# Patient Record
Sex: Female | Born: 2000 | Hispanic: No | Marital: Single | Smoking: Never smoker
Health system: Southern US, Community
[De-identification: ages and names within clinical notes are randomized; demographics above are authoritative.]

## PROBLEM LIST (undated history)

## (undated) ENCOUNTER — Inpatient Hospital Stay (HOSPITAL_COMMUNITY): Payer: Self-pay

## (undated) DIAGNOSIS — K219 Gastro-esophageal reflux disease without esophagitis: Secondary | ICD-10-CM

## (undated) HISTORY — DX: Gastro-esophageal reflux disease without esophagitis: K21.9

## (undated) HISTORY — PX: NO PAST SURGERIES: SHX2092

---

## 2020-12-13 ENCOUNTER — Ambulatory Visit: Payer: Self-pay | Admitting: Nurse Practitioner

## 2020-12-16 ENCOUNTER — Other Ambulatory Visit: Payer: Self-pay

## 2020-12-16 ENCOUNTER — Emergency Department (HOSPITAL_COMMUNITY)
Admission: EM | Admit: 2020-12-16 | Discharge: 2020-12-17 | Disposition: A | Discharge status: Home / Self Care | Payer: Medicaid Other | Attending: Emergency Medicine | Admitting: Emergency Medicine

## 2020-12-16 DIAGNOSIS — R55 Syncope and collapse: Secondary | ICD-10-CM

## 2020-12-16 DIAGNOSIS — R519 Headache, unspecified: Secondary | ICD-10-CM | POA: Diagnosis not present

## 2020-12-16 DIAGNOSIS — R42 Dizziness and giddiness: Secondary | ICD-10-CM | POA: Insufficient documentation

## 2020-12-16 LAB — URINALYSIS, ROUTINE W REFLEX MICROSCOPIC
Bilirubin Urine: NEGATIVE
Glucose, UA: NEGATIVE mg/dL
Hgb urine dipstick: NEGATIVE
Ketones, ur: NEGATIVE mg/dL
Leukocytes,Ua: NEGATIVE
Nitrite: NEGATIVE
Protein, ur: NEGATIVE mg/dL
Specific Gravity, Urine: 1.009 (ref 1.005–1.030)
pH: 8 (ref 5.0–8.0)

## 2020-12-16 LAB — CBC
HCT: 40.1 % (ref 36.0–46.0)
Hemoglobin: 13.3 g/dL (ref 12.0–15.0)
MCH: 29.1 pg (ref 26.0–34.0)
MCHC: 33.2 g/dL (ref 30.0–36.0)
MCV: 87.7 fL (ref 80.0–100.0)
Platelets: 195 10*3/uL (ref 150–400)
RBC: 4.57 MIL/uL (ref 3.87–5.11)
RDW: 12.4 % (ref 11.5–15.5)
WBC: 9.9 10*3/uL (ref 4.0–10.5)
nRBC: 0 % (ref 0.0–0.2)

## 2020-12-16 LAB — BASIC METABOLIC PANEL
Anion gap: 10 (ref 5–15)
BUN: 12 mg/dL (ref 6–20)
CO2: 25 mmol/L (ref 22–32)
Calcium: 9.5 mg/dL (ref 8.9–10.3)
Chloride: 106 mmol/L (ref 98–111)
Creatinine, Ser: 0.62 mg/dL (ref 0.44–1.00)
GFR, Estimated: >60 mL/min (ref >60)
Glucose, Bld: 88 mg/dL (ref 70–99)
Potassium: 4.2 mmol/L (ref 3.5–5.1)
Sodium: 141 mmol/L (ref 135–145)

## 2020-12-16 LAB — I-STAT BETA HCG BLOOD, ED (MC, WL, AP ONLY): I-stat hCG, quantitative: <5 m[IU]/mL (ref <5)

## 2020-12-16 NOTE — ED Triage Notes (Signed)
Pt bib [facility] from home/air BNB. Pt apparently had witnessed syncopal episode. Previously seen/eval for same, no relevant hx. Concerned episode could be stress related   20LAC

## 2020-12-17 ENCOUNTER — Other Ambulatory Visit: Payer: Self-pay

## 2020-12-17 ENCOUNTER — Emergency Department (HOSPITAL_COMMUNITY): Payer: Medicaid Other

## 2020-12-17 ENCOUNTER — Encounter (HOSPITAL_COMMUNITY): Payer: Self-pay | Admitting: Student

## 2020-12-17 DIAGNOSIS — R55 Syncope and collapse: Secondary | ICD-10-CM

## 2020-12-17 LAB — CBG MONITORING, ED: Glucose-Capillary: 110 mg/dL — ABNORMAL HIGH (ref 70–99)

## 2020-12-17 MED ORDER — SODIUM CHLORIDE 0.9 % IV BOLUS
1000.0000 mL | Freq: Once | INTRAVENOUS | Status: AC
  Administered 2020-12-17: 1000 mL via INTRAVENOUS

## 2020-12-17 MED ORDER — IOHEXOL 350 MG/ML SOLN
75.0000 mL | Freq: Once | INTRAVENOUS | Status: AC | PRN
  Administered 2020-12-17: 75 mL via INTRAVENOUS

## 2020-12-17 MED ORDER — AMITRIPTYLINE HCL 25 MG PO TABS: 25.0000 mg | ORAL_TABLET | Freq: Every day | ORAL | 0 refills | Status: DC

## 2020-12-17 NOTE — ED Notes (Signed)
Received pt at this time from triage.  

## 2020-12-17 NOTE — ED Provider Notes (Signed)
[facility] EMERGENCY DEPARTMENT Provider Note   CSN: [id number] Arrival date & time: 12/16/20  2133     History Chief Complaint  Patient presents with  . Near Syncope  . Loss of Consciousness    Bethany Harris is a 20 y.o. female who presents to the emergency department with her father for evaluation of syncopal episode that occurred last night. Patient's father provides primary history and patient confirms- he relays that the patient has been having episodes where she gets a gradual onset headache with dizziness/lightheadedness and subsequently becomes unresponsive. She remains breathing, but does not verbally respond and this lasts approximately 2-3 hours at a time. They attempt to wake her and splash water on her with no response. They have not noted any shaking/jerking or seizure activity. She has had about 15 episodes in the past 10 months. Recently moved to the [place] from [place]. Had seen doctors there and saw an ED in [place]- has had reassuring CT of the head in the past. Her mother had similar sxs and per documentation they have brought with them it appears died from a ruptured aneurysm. Deny fever, vomiting, diarrhea, chest pain, dyspnea, or abdominal pain.   HPI     No past medical history on file.  There are no problems to display for this patient.   History reviewed. No pertinent surgical history.   OB History   No obstetric history on file.     No family history on file.     Home Medications Prior to Admission medications   Not on File    Allergies    Patient has no allergy information on record.  Review of Systems   Review of Systems  Constitutional: Negative for chills and fever.  Eyes: Negative for visual disturbance.  Respiratory: Negative for shortness of breath.   Cardiovascular: Negative for chest pain.  Gastrointestinal: Negative for diarrhea, nausea and vomiting.  Neurological: Positive for dizziness, syncope,  light-headedness and headaches. Negative for weakness and numbness.  All other systems reviewed and are negative.   Physical Exam Updated Vital Signs BP 98/64 (BP Location: Right Arm)   Pulse 86   Temp 98.2 F (36.8 C) (Oral)   Resp 18   Ht 5\' 2"  (1.575 m)   Wt 49.9 kg   SpO2 99%   BMI 20.12 kg/m   Physical Exam Vitals and nursing note reviewed.  Constitutional:      General: She is not in acute distress.    Appearance: She is well-developed. She is not toxic-appearing.     Comments: Initially sleeping, easily awakened and subsequently alert.   HENT:     Head: Normocephalic and atraumatic.  Eyes:     General:        Right eye: No discharge.        Left eye: No discharge.     Extraocular Movements: Extraocular movements intact.     Conjunctiva/sclera: Conjunctivae normal.     Pupils: Pupils are equal, round, and reactive to light.  Cardiovascular:     Rate and Rhythm: Normal rate and regular rhythm.     Heart sounds: No murmur heard.   Pulmonary:     Effort: Pulmonary effort is normal. No respiratory distress.     Breath sounds: Normal breath sounds. No wheezing, rhonchi or rales.  Abdominal:     General: There is no distension.     Palpations: Abdomen is soft.     Tenderness: There is no abdominal tenderness. There is no  guarding or rebound.  Musculoskeletal:     Cervical back: Neck supple. No rigidity.  Skin:    General: Skin is warm and dry.     Findings: No rash.  Neurological:     Mental Status: She is alert.     Comments: Clear speech.  CN III through XII grossly intact.  Sensation grossly intact bilateral upper and lower extremities.  5 out of 5 symmetric grip strength.  5 out of 5 strength with plantar dorsiflexion bilaterally.  Intact finger-to-nose.  Psychiatric:        Behavior: Behavior normal.     ED Results / Procedures / Treatments   Labs (all labs ordered are listed, but only abnormal results are displayed) Labs Reviewed  URINALYSIS, ROUTINE  W REFLEX MICROSCOPIC - Abnormal; Notable for the following components:      Result Value   Color, Urine STRAW (*)    All other components within normal limits  CBG MONITORING, ED - Abnormal; Notable for the following components:   Glucose-Capillary 110 (*)    All other components within normal limits  BASIC METABOLIC PANEL  CBC  I-STAT BETA HCG BLOOD, ED ([facility], [facility], [facility] ONLY)    EKG EKG Interpretation  Date/Time:  Thursday December 16 2020 21:46:34 EST Ventricular Rate:  87 PR Interval:  110 QRS Duration: 78 QT Interval:  348 QTC Calculation: 418 R Axis:   78 Text Interpretation: Sinus rhythm with short PR Otherwise normal ECG No old tracing to compare Confirmed by Rolan Bucco [phone]) on 12/17/2020 7:39:45 AM   Radiology No results found.  Procedures Procedures (including critical care time)  Medications Ordered in ED Medications  iohexol (OMNIPAQUE) 350 MG/ML injection 75 mL (has no administration in time range)  sodium chloride 0.9 % bolus 1,000 mL (0 mLs Intravenous Stopped 12/17/20 0630)    ED Course  I have reviewed the triage vital signs and the nursing notes.  Pertinent labs & imaging results that were available during my care of the patient were reviewed by me and considered in my medical decision making (see chart for details).    MDM Rules/Calculators/A&P                         Patient presents to the ED with complaints of recurrent episodes where she gets dizzy with a headache and subsequently passes out and is not verbally responsive for 2 to 3 hours.  This seems to be a recurrent problem over the past 1 year.  She is nontoxic, resting comfortably, vitals without significant abnormality.  No focal neurologic deficits.  Heart regular rate and rhythm.  Additional history obtained:  Additional history obtained from paperwork patient's family has brought with them.  EKG: Sinus rhythm, on cardiac monitor w/o arrhythmias currently.   Lab Tests:  I Ordered,  reviewed, and interpreted labs, which included:  CBC, BMP, pregnancy test, and CBG: Grossly unremarkable  Imaging Studies ordered:  I ordered imaging studies which included CTA head/neck given concern that her mother may have died from an aneurysm.   Overall unclear definitive etiology to these episodes patient is having.  She has a reassuring laboratory work-up and physical exam.  If her CT angio does not show any significant concerning abnormalities plan for discharge home with outpatient neurology follow-up.  Findings and plan of care discussed with attending Dr. Fredderick Phenix who is in agreement.  Patient care signed out to PA Aberman at change of shift pending CTAs and disposition.  Portions of this note were generated with Scientist, clinical (histocompatibility and immunogenetics). Dictation errors may occur despite best attempts at proofreading.  Final Clinical Impression(s) / ED Diagnoses Final diagnoses:  Syncope, unspecified syncope type    Rx / DC Orders ED Discharge Orders    None       Cherly Anderson, PA-C 12/17/20 0741    Rolan Bucco, MD 12/17/20 [id number]

## 2020-12-17 NOTE — Discharge Instructions (Addendum)
You were seen in the emergency department today for episodes of headaches, dizziness, and passing out.  Your work-up in the ER was overall reassuring.  We would like you to stay well-hydrated and follow-up closely with neurology.  We have placed a referral, they will call you to set up a follow-up appointment.  If you they do not call you within the next few days please call the office your discharge instructions.  Please return to the ER for new or worsening symptoms including but not limited to prolonged decreased responsiveness, seizure activity, new headache, severe headache, sudden change in headache, inability to keep fluids down, fever, numbness, weakness, visual disturbance, chest pain, trouble breathing, or any other concerns.  The neurologist spoke to you today and recommends taking Elavil 25mg  every night before bed. If you do not hear from neurology within the next week, please call to schedule an appointment.

## 2020-12-17 NOTE — ED Provider Notes (Signed)
Care assumed from [facility], PA-C at shift change pending CTA head/neck. See her note for full HPI.  In short, patient is a 20 year old female who presents to the ED due to numerous syncopal events over the past 10 months with the most recent being last night.  Syncopal episodes preceded by headache associated with dizziness/lightheadedness.  Father at bedside notes that patient becomes unresponsive however, still breathing, will not answer any questions for roughly 2 to 3 hours.  No convulsions or urinary incontinence.  Per father, patient has had 15 episodes over the past 10 months.  Patient recently moved to the [place] from [place] and has had previous work-ups both in [place] and in [place] with reassuring results. Per father, mother had similar symptoms and passed away from a ruptured aneurysm.   Plan from previous provider: if CTA head/neck is unremarkable, patient may be discharged with ambulatory referral to neurology.  Physical Exam  BP 98/64 (BP Location: Right Arm)   Pulse 86   Temp 98.2 F (36.8 C) (Oral)   Resp 18   Ht 5\' 2"  (1.575 m)   Wt 49.9 kg   SpO2 99%   BMI 20.12 kg/m   Physical Exam Constitutional:      General: She is not in acute distress. HENT:     Head: Normocephalic.  Eyes:     Pupils: Pupils are equal, round, and reactive to light.  Cardiovascular:     Rate and Rhythm: Normal rate and regular rhythm.     Pulses: Normal pulses.     Heart sounds: Normal heart sounds. No murmur heard. No friction rub. No gallop.   Pulmonary:     Effort: Pulmonary effort is normal.     Breath sounds: Normal breath sounds.  Abdominal:     General: Abdomen is flat. There is no distension.     Palpations: Abdomen is soft.     Tenderness: There is no abdominal tenderness. There is no guarding or rebound.  Musculoskeletal:        General: Normal range of motion.     Cervical back: Neck supple.  Skin:    General: Skin is warm and dry.  Neurological:      General: No focal deficit present.     Comments: Speech is clear, able to follow commands CN III-XII intact Normal strength in upper and lower extremities bilaterally including dorsiflexion and plantar flexion, strong and equal grip strength Sensation grossly intact throughout Moves extremities without ataxia, coordination intact No pronator drift Ambulates without difficulty  Psychiatric:        Mood and Affect: Mood normal.        Behavior: Behavior normal.     ED Course/Procedures    Results for orders placed or performed during the hospital encounter of 12/16/20 (from the past 24 hour(s))  Urinalysis, Routine w reflex microscopic Urine, Clean Catch     Status: Abnormal   Collection Time: 12/16/20  9:46 PM  Result Value Ref Range   Color, Urine STRAW (A) YELLOW   APPearance CLEAR CLEAR   Specific Gravity, Urine 1.009 1.005 - 1.030   pH 8.0 5.0 - 8.0   Glucose, UA NEGATIVE NEGATIVE mg/dL   Hgb urine dipstick NEGATIVE NEGATIVE   Bilirubin Urine NEGATIVE NEGATIVE   Ketones, ur NEGATIVE NEGATIVE mg/dL   Protein, ur NEGATIVE NEGATIVE mg/dL   Nitrite NEGATIVE NEGATIVE   Leukocytes,Ua NEGATIVE NEGATIVE  Basic metabolic panel     Status: None   Collection Time: 12/16/20 10:07  PM  Result Value Ref Range   Sodium 141 135 - 145 mmol/L   Potassium 4.2 3.5 - 5.1 mmol/L   Chloride 106 98 - 111 mmol/L   CO2 25 22 - 32 mmol/L   Glucose, Bld 88 70 - 99 mg/dL   BUN 12 6 - 20 mg/dL   Creatinine, Ser 2.37 0.44 - 1.00 mg/dL   Calcium 9.5 8.9 - 62.8 mg/dL   GFR, Estimated >31 >51 mL/min   Anion gap 10 5 - 15  CBC     Status: None   Collection Time: 12/16/20 10:07 PM  Result Value Ref Range   WBC 9.9 4.0 - 10.5 K/uL   RBC 4.57 3.87 - 5.11 MIL/uL   Hemoglobin 13.3 12.0 - 15.0 g/dL   HCT 76.1 60.7 - 37.1 %   MCV 87.7 80.0 - 100.0 fL   MCH 29.1 26.0 - 34.0 pg   MCHC 33.2 30.0 - 36.0 g/dL   RDW 06.2 69.4 - 85.4 %   Platelets 195 150 - 400 K/uL   nRBC 0.0 0.0 - 0.2 %  I-Stat beta hCG  blood, ED     Status: None   Collection Time: 12/16/20 10:43 PM  Result Value Ref Range   I-stat hCG, quantitative <5.0 <5 mIU/mL   Comment 3          CBG monitoring, ED     Status: Abnormal   Collection Time: 12/17/20  5:55 AM  Result Value Ref Range   Glucose-Capillary 110 (H) 70 - 99 mg/dL    Procedures  MDM  Care assumed from [facility], PA-C at shift change pending CTA head/neck. See her note for full MDM.  20 year old female presents to the ED due to numerous syncopal events over the past 10 months.  Syncopal events preceded by headache associated with dizziness/lightheadedness.  Mother had similar symptoms and passed away from a ruptured aneurysm. Patient present in the ED for over 10 hours during shift change with no syncopal episodes. Stable vitals. Soft BP. Patient given IVFs from previous provider. I have personally reviewed all labs and imaging from previous provider.  CBC unremarkable no leukocytosis and normal hemoglobin.  BMP unremarkable with normal renal function no major electrolyte derangements.  Pregnancy test negative.  UA reassuring with no signs of infection.  EKG personally reviewed which demonstrates normal sinus rhythm with short PR, but no signs of acute ischemia.  CTA head/neck personally reviewed which is negative for any acute abnormalities.   Discussed case with Dr. Amada Jupiter with neurology who recommends treating for possible migraines with Amitriptyline 25mg  and outpatient follow-up for EEG to rule out seizure even though suspicion is low. Suspect a possible psychogenic etiology.  Patient observed here in the ED for over 13 hours with no syncopal episodes. Patient given another L of IVFs with improvement in BP. Patient stable for discharge. Strict ED precautions discussed with patient. Patient states understanding and agrees to plan. Patient discharged home in no acute distress and stable vitals.   , PA-C 12/17/20 1058     12/19/20, MD 12/17/20 1136

## 2020-12-17 NOTE — ED Notes (Signed)
ED Provider at bedside. 

## 2020-12-17 NOTE — Consult Note (Signed)
Neurology Consultation  Reason for Consult: Concern of seizure  Referring Physician: ED physician  CC: Concern of seizure  History is obtained from: Patient and Father  HPI: Bethany Harris is a 20 y.o. female with no past medical history presented to ED for concern of seizure like episodes.    Patient father was at the bedside who provided the history.  He reported that patient had about 7-8 passing out episodes In last 4 months. Last episode last night. These episodes usually last for about 1 to 1-1/2-hour. Father denies any shaking and jerking movements during these episodes. Father reports that he attempts to call her name and splash water on her face but she doesn't respond. Patient had about 15 episodes in last 10 months.  Father states when they were in [place] her episodes lasted for 5 to 10 minutes but since coming to [place] these episodes are lasting for longer periods. Patient states she gets severe headache for about 15 minutes to half an hour, flashing of light for a few seconds, blackening in front of her eyes, and dizziness/lightheadness before passing out.  When she passes out she can only hear loud sounds on repeated calling but she is not able to move her extremities and when she wakes up she always have severe frontal headache. Father reports that she had seen doctors in ED in [place] and had normal CT of head in the past. Father reports that before one of these episode she vomited some blood before passing out. Patient denies any memory problems, fever, nausea, vomiting.  Father reports that her mother also had similar episodes and died at age 51 from ruptured aneurysm.   [place]. Pt has 6 other siblings and no other sibling have similar symptoms.   ROS: A 14 point ROS was performed and is negative except as noted in the HPI. Positive for Headache, passing out episodes.    History reviewed. No pertinent past medical history.    History reviewed. No pertinent family history. Mother had similar episodes. Died at the age of 2.  Social History:   has no history on file for tobacco use, alcohol use, and drug use.  Medications No current facility-administered medications for this encounter.  Current Outpatient Medications:  .  amitriptyline (ELAVIL) 25 MG tablet, Take 1 tablet (25 mg total) by mouth at bedtime., Disp: 30 tablet, Rfl: 0   Exam: Current vital signs: BP 115/72 (BP Location: Right Arm)   Pulse 89   Temp 98.6 F (37 C) (Oral)   Resp 20   Ht 5\' 2"  (1.575 m)   Wt 49.9 kg   SpO2 100%   BMI 20.12 kg/m  Vital signs in last 24 hours: Temp:  [98.2 F (36.8 C)-98.6 F (37 C)] 98.6 F (37 C) (01/21 1022) Pulse Rate:  [71-89] 89 (01/21 1022) Resp:  [14-20] 20 (01/21 1022) BP: (81-120)/(46-80) 115/72 (01/21 1022) SpO2:  [98 %-100 %] 100 % (01/21 1022) Weight:  [49.9 kg] 49.9 kg (01/21 0425)  GENERAL: Awake, alert in NAD, oriented x4, Knows date, month and year. HEENT: - Normocephalic and atraumatic, dry mucous membranes LUNGS - symmetrical chest rise, no labored breathing CV - No JVD, RRR, No peripheral Edema  ABDOMEN - Soft,  nondistended  Ext: warm, well perfused, noedema  NEURO:  Mental Status: AA&Ox4, Oriented to self, age, place, situation. Good Attention and Concentration Language: speech is fluent.  Pt is able to name simple objects, repetition intact,  fluency, and comprehension intact. Cranial Nerves: PERRL, EOMI, mild pain on Eye movements. Pain in Rt eye >Lt eye. Visual fields full, no facial asymmetry, facial sensation intact, hearing intact.  tongue/uvula/soft palate midline, normal sternocleidomastoid and trapezius muscle strength. No evidence of tongue atrophy or fibrillations Motor: No Drift in Upper Extremities, No Drift in LE's. Strength 5/5 in Rt UE, 5/5 in Lt UE, Strength in Rt LE  5/5, Lt LE 5/5 Reflexes- UE -2+  ,  LE-  2+ Tone: is normal and bulk is normal Sensation- Intact to light touch bilaterally. Symmetrical on Both sides. Coordination: FTN intact bilaterally, no ataxia in BLE. Gait- deferred    Labs I have reviewed labs in epic and the results pertinent to this consultation are:   CBC    Component Value Date/Time   WBC 9.9 12/16/2020 2207   RBC 4.57 12/16/2020 2207   HGB 13.3 12/16/2020 2207   HCT 40.1 12/16/2020 2207   PLT 195 12/16/2020 2207   MCV 87.7 12/16/2020 2207   MCH 29.1 12/16/2020 2207   MCHC 33.2 12/16/2020 2207   RDW 12.4 12/16/2020 2207    CMP     Component Value Date/Time   NA 141 12/16/2020 2207   K 4.2 12/16/2020 2207   CL 106 12/16/2020 2207   CO2 25 12/16/2020 2207   GLUCOSE 88 12/16/2020 2207   BUN 12 12/16/2020 2207   CREATININE 0.62 12/16/2020 2207   CALCIUM 9.5 12/16/2020 2207   GFRNONAA >60 12/16/2020 2207    Lipid Panel  No results found for: CHOL, TRIG, HDL, CHOLHDL, VLDL, LDLCALC, LDLDIRECT   Imaging I have reviewed the images obtained: CT head:  No evidence of acute intracranial abnormality.  CTA neck:  The common carotid, internal carotid and vertebral arteries are patent within the neck without stenosis.  CTA head:  Unremarkable exam. No intracranial large vessel occlusion or proximal high-grade arterial stenosis.   Assessment: Patient is a 20 year old female who presented to the ED with concerns of multiple seizure/syncopal episodes.  No jerking/shaking reported by father during these episodes.  Blood pressure running low with systolic between 00-938 and diastolic between 46 to 80 mmHg.  Labs WNL. CT head negative for acute intracranial abnormality.  CTA neck and head normal without stenosis. Pseudoseizures vs. Seizures. I suspect that her symptoms are less likely due to organic cause but we'll refer Pt for outpatient EEG. Also, concerns of migraine.  We'll start her on Amitriptyline 25 mg QHS for Migraine and see if it  helps with these episodes also. Could consider MRI on outpatient basis if she still gets these episodes.    Recommendations: - Continue Amitriptyline 25 mg QHS - Refer for Outpatient EEG. - Can consider MRI Brain if she gets these episodes again.  - Seizure precautions.   Dr. Karsten Ro MD PGY1 [facility] Neurology

## 2020-12-29 ENCOUNTER — Ambulatory Visit: Payer: Self-pay | Admitting: Nurse Practitioner

## 2021-01-03 ENCOUNTER — Other Ambulatory Visit: Payer: Self-pay | Admitting: Nurse Practitioner

## 2021-01-03 ENCOUNTER — Ambulatory Visit (INDEPENDENT_AMBULATORY_CARE_PROVIDER_SITE_OTHER): Payer: Medicaid Other | Admitting: Nurse Practitioner

## 2021-01-03 ENCOUNTER — Other Ambulatory Visit: Payer: Self-pay

## 2021-01-03 ENCOUNTER — Encounter: Payer: Self-pay | Admitting: Nurse Practitioner

## 2021-01-03 VITALS — BP 117/69 | HR 108 | Temp 98.8°F | Ht 63.0 in | Wt 134.0 lb

## 2021-01-03 DIAGNOSIS — G479 Sleep disorder, unspecified: Secondary | ICD-10-CM | POA: Diagnosis not present

## 2021-01-03 DIAGNOSIS — R55 Syncope and collapse: Secondary | ICD-10-CM | POA: Diagnosis not present

## 2021-01-03 DIAGNOSIS — R000 Tachycardia, unspecified: Secondary | ICD-10-CM

## 2021-01-03 DIAGNOSIS — Z7689 Persons encountering health services in other specified circumstances: Secondary | ICD-10-CM | POA: Diagnosis not present

## 2021-01-03 DIAGNOSIS — Z130 Encounter for screening for diseases of the blood and blood-forming organs and certain disorders involving the immune mechanism: Secondary | ICD-10-CM

## 2021-01-03 MED ORDER — AMITRIPTYLINE HCL 25 MG PO TABS: 50.0000 mg | ORAL_TABLET | Freq: Every day | ORAL | 2 refills | Status: DC

## 2021-01-03 NOTE — Patient Instructions (Signed)
Health Maintenance, Female Adopting a healthy lifestyle and getting preventive care are important in promoting health and wellness. Ask your health care provider about:  The right schedule for you to have regular tests and exams.  Things you can do on your own to prevent diseases and keep yourself healthy. What should I know about diet, weight, and exercise? Eat a healthy diet  Eat a diet that includes plenty of vegetables, fruits, low-fat dairy products, and lean protein.  Do not eat a lot of foods that are high in solid fats, added sugars, or sodium.   Maintain a healthy weight Body mass index (BMI) is used to identify weight problems. It estimates body fat based on height and weight. Your health care provider can help determine your BMI and help you achieve or maintain a healthy weight. Get regular exercise Get regular exercise. This is one of the most important things you can do for your health. Most adults should:  Exercise for at least 150 minutes each week. The exercise should increase your heart rate and make you sweat (moderate-intensity exercise).  Do strengthening exercises at least twice a week. This is in addition to the moderate-intensity exercise.  Spend less time sitting. Even light physical activity can be beneficial. Watch cholesterol and blood lipids Have your blood tested for lipids and cholesterol at 20 years of age, then have this test every 5 years. Have your cholesterol levels checked more often if:  Your lipid or cholesterol levels are high.  You are older than 20 years of age.  You are at high risk for heart disease. What should I know about cancer screening? Depending on your health history and family history, you may need to have cancer screening at various ages. This may include screening for:  Breast cancer.  Cervical cancer.  Colorectal cancer.  Skin cancer.  Lung cancer. What should I know about heart disease, diabetes, and high blood  pressure? Blood pressure and heart disease  High blood pressure causes heart disease and increases the risk of stroke. This is more likely to develop in people who have high blood pressure readings, are of African descent, or are overweight.  Have your blood pressure checked: ? Every 3-5 years if you are 18-39 years of age. ? Every year if you are 40 years old or older. Diabetes Have regular diabetes screenings. This checks your fasting blood sugar level. Have the screening done:  Once every three years after age 40 if you are at a normal weight and have a low risk for diabetes.  More often and at a younger age if you are overweight or have a high risk for diabetes. What should I know about preventing infection? Hepatitis B If you have a higher risk for hepatitis B, you should be screened for this virus. Talk with your health care provider to find out if you are at risk for hepatitis B infection. Hepatitis C Testing is recommended for:  Everyone born from 1945 through 1965.  Anyone with known risk factors for hepatitis C. Sexually transmitted infections (STIs)  Get screened for STIs, including gonorrhea and chlamydia, if: ? You are sexually active and are younger than 20 years of age. ? You are older than 20 years of age and your health care provider tells you that you are at risk for this type of infection. ? Your sexual activity has changed since you were last screened, and you are at increased risk for chlamydia or gonorrhea. Ask your health care provider   if you are at risk.  Ask your health care provider about whether you are at high risk for HIV. Your health care provider may recommend a prescription medicine to help prevent HIV infection. If you choose to take medicine to prevent HIV, you should first get tested for HIV. You should then be tested every 3 months for as long as you are taking the medicine. Pregnancy  If you are about to stop having your period (premenopausal) and  you may become pregnant, seek counseling before you get pregnant.  Take 400 to 800 micrograms (mcg) of folic acid every day if you become pregnant.  Ask for birth control (contraception) if you want to prevent pregnancy. Osteoporosis and menopause Osteoporosis is a disease in which the bones lose minerals and strength with aging. This can result in bone fractures. If you are 65 years old or older, or if you are at risk for osteoporosis and fractures, ask your health care provider if you should:  Be screened for bone loss.  Take a calcium or vitamin D supplement to lower your risk of fractures.  Be given hormone replacement therapy (HRT) to treat symptoms of menopause. Follow these instructions at home: Lifestyle  Do not use any products that contain nicotine or tobacco, such as cigarettes, e-cigarettes, and chewing tobacco. If you need help quitting, ask your health care provider.  Do not use street drugs.  Do not share needles.  Ask your health care provider for help if you need support or information about quitting drugs. Alcohol use  Do not drink alcohol if: ? Your health care provider tells you not to drink. ? You are pregnant, may be pregnant, or are planning to become pregnant.  If you drink alcohol: ? Limit how much you use to 0-1 drink a day. ? Limit intake if you are breastfeeding.  Be aware of how much alcohol is in your drink. In the [place] one drink equals one 12 oz bottle of beer (355 mL), one 5 oz glass of wine (148 mL), or one 1 oz glass of hard liquor (44 mL). General instructions  Schedule regular health, dental, and eye exams.  Stay current with your vaccines.  Tell your health care provider if: ? You often feel depressed. ? You have ever been abused or do not feel safe at home. Summary  Adopting a healthy lifestyle and getting preventive care are important in promoting health and wellness.  Follow your health care provider's instructions about healthy  diet, exercising, and getting tested or screened for diseases.  Follow your health care provider's instructions on monitoring your cholesterol and blood pressure. This information is not intended to replace advice given to you by your health care provider. Make sure you discuss any questions you have with your health care provider. Document Revised: 11/06/2018 Document Reviewed: 11/06/2018 Elsevier Patient Education  2021 Elsevier Inc.  

## 2021-01-03 NOTE — Progress Notes (Signed)
[facility] Patient [facility] [address] [place], [place]  [address] Phone:  [phone]   Fax:  [phone]   New Patient Office Visit  Subjective:  Patient ID: Bethany Harris, female    DOB: 11/27/2000  Age: 20 y.o. MRN: [id number]  CC:  Chief Complaint  Patient presents with  . New Patient (Initial Visit)    HPI Margarite Vessel presents to establish care. He  has a past medical history of GERD (gastroesophageal reflux disease).   Establish care not concerns. She is in today with her father who speaks [place] and is her interpreter. They have been in [place] for 4 months. She is currently taking Amitriptyline 25 mg for sleep was effective for a few days. She has not slept the last 2 days. She feels like she is having increased anxiety.  She admits that outside noise does affect her sleep.   Syncope Patient complains of syncope. Onset was a few months ago . Symptoms have stabilized since that time. Patient describes the episode as a sudden loss of consciousness without warning. Associated symptoms: none. The patient denies abdominal pain, diarrhea, excessive thirst, general feeling of lightheadedness, headache, heavy menstrual bleeding, history of CAD, melena, nausea and visual aura. Medications putting patient at risk for syncope: none. She had a MRI in [place]. She has had a syncopal episode. She was seen in the ER. She is to follow up with neurology. However the apt is unknown; not visible in her chart  Past Medical History:  Diagnosis Date  . GERD (gastroesophageal reflux disease)     History reviewed. No pertinent surgical history.  History reviewed. No pertinent family history.  Social History   Socioeconomic History  . Marital status: Married    Spouse name: Not on file  . Number of children: Not on file  . Years of education: Not on file  . Highest education level: Not on file  Occupational History  . Not on file  Tobacco Use  . Smoking status: Never Smoker   . Smokeless tobacco: Never Used  Substance and Sexual Activity  . Alcohol use: Never  . Drug use: Never  . Sexual activity: Not on file  Other Topics Concern  . Not on file  Social History Narrative  . Not on file   Social Determinants of Health   Financial Resource Strain: Not on file  Food Insecurity: Not on file  Transportation Needs: Not on file  Physical Activity: Not on file  Stress: Not on file  Social Connections: Not on file  Intimate Partner Violence: Not on file    ROS Review of Systems  Psychiatric/Behavioral:       Anxiety     Objective:   Today's Vitals: BP 117/69   Pulse (!) 108   Temp 98.8 F (37.1 C) (Temporal)   Ht 5\' 3"  (1.6 m)   Wt 134 lb (60.8 kg)   SpO2 99%   BMI 23.74 kg/m   Physical Exam Constitutional:      General: She is not in acute distress.    Appearance: She is normal weight.  HENT:     Head: Normocephalic and atraumatic.     Nose: Nose normal.     Mouth/Throat:     Mouth: Mucous membranes are moist.  Cardiovascular:     Rate and Rhythm: Normal rate and regular rhythm.     Pulses: Normal pulses.     Heart sounds: Normal heart sounds.  Pulmonary:  Effort: Pulmonary effort is normal.     Breath sounds: Normal breath sounds.  Abdominal:     General: Abdomen is flat. Bowel sounds are normal.  Musculoskeletal:        General: Normal range of motion.     Cervical back: Normal range of motion.     Comments: Hands appear swollen; denied  Skin:    General: Skin is warm and dry.     Capillary Refill: Capillary refill takes less than 2 seconds.  Neurological:     General: No focal deficit present.     Mental Status: She is alert and oriented to person, place, and time.  Psychiatric:        Mood and Affect: Mood normal.        Behavior: Behavior normal.        Thought Content: Thought content normal.        Judgment: Judgment normal.    Assessment  Primary Diagnosis & Pertinent Problem List: The primary encounter  diagnosis was Encounter to establish care. Diagnoses of Syncope, unspecified syncope type and Sleep difficulties were also pertinent to this visit.  Visit Diagnosis: 1. Encounter to establish care  Discussed female health maintenance;  Discussed regular hydration with water Discussed healthy diet and exercise  Discussed mental health Encouraged to call our office for an appointment with in ongoing concerns for questions.    2. Syncope, unspecified syncope type  Stable to establish care with neurology  3. Sleep difficulties  Persistent increased Amitriptyline 25 mg mg instructed to 1.5 tabs if not effective then may increase to amitriptyline 50 mg (2 tabs)    Outpatient Encounter Medications as of 01/03/2021  Medication Sig  . amitriptyline (ELAVIL) 25 MG tablet Take 25 mg by mouth at bedtime.   No facility-administered encounter medications on file as of 01/03/2021.    Follow-up: 3 month follow-ups o  Barbette Merino, NP

## 2021-01-04 LAB — IRON,TIBC AND FERRITIN PANEL
Ferritin: 53 ng/mL (ref 15–77)
Iron Saturation: 17 % (ref 15–55)
Iron: 55 ug/dL (ref 27–159)
Total Iron Binding Capacity: 328 ug/dL (ref 250–450)
UIBC: 273 ug/dL (ref 131–425)

## 2021-01-04 LAB — TSH: TSH: 1.14 u[IU]/mL (ref 0.450–4.500)

## 2021-01-14 ENCOUNTER — Ambulatory Visit: Payer: Medicaid Other | Attending: Internal Medicine | Admitting: Internal Medicine

## 2021-01-14 ENCOUNTER — Encounter: Payer: Self-pay | Admitting: Internal Medicine

## 2021-01-14 ENCOUNTER — Other Ambulatory Visit: Payer: Self-pay

## 2021-01-14 VITALS — BP 119/78 | HR 107 | Resp 16 | Ht 65.0 in | Wt 136.0 lb

## 2021-01-14 DIAGNOSIS — F320 Major depressive disorder, single episode, mild: Secondary | ICD-10-CM | POA: Diagnosis not present

## 2021-01-14 DIAGNOSIS — R55 Syncope and collapse: Secondary | ICD-10-CM | POA: Diagnosis not present

## 2021-01-14 DIAGNOSIS — Z2821 Immunization not carried out because of patient refusal: Secondary | ICD-10-CM | POA: Diagnosis not present

## 2021-01-14 NOTE — Progress Notes (Signed)
Patient ID: Bethany Harris, female    DOB: [birth year]  MRN: [id number]  CC: Hospitalization Follow-up (ED)   Subjective: Bethany Harris is a 20 y.o. female who presents for new pt visit.  Father is with her and interprets and provides most of the history.  Patient is from [place] and speaks  Her concerns today include:   Patient seen in the emergency room 12/17/2020 for syncopal episodes.  History from the ER is as follows: patient is a 20 year old female who presents to the ED due to numerous syncopal events over the past 10 months with the most recent being last night.  Syncopal episodes preceded by headache associated with dizziness/lightheadedness.  Father at bedside notes that patient becomes unresponsive however, still breathing, will not answer any questions for roughly 2 to 3 hours.  No convulsions or urinary incontinence.  Per father, patient has had 15 episodes over the past 10 months.  Patient recently moved to the [place] from [place] and has had previous work-ups both in [place] and in [place] with reassuring results. Per father, mother had similar symptoms and passed away from a ruptured aneurysm.   Patient's vitals were stable except for slightly low systolic blood pressure.  Patient was given IV fluids.  In the emergency department.  CTA of the head and neck was negative.  CBC, electrolytes and UA were normal.  Urine pregnancy test negative. Case was discussed with neurologist Dr. Amada Jupiter who recommended treating for possible migraine with amitriptyline 25 mg and outpatient follow-up for EEG to rule out seizure. Patient was seen 01/03/2021 at the [facility] by NP Brooke Dare to establish care and for follow-up from the emergency room.  Amitriptyline dose was increased to 50 mg.  Today Father reports that patient has been having these intermittent syncope for about a year but worse over the past 3 to 4 months.  She has had 7-8 episodes over the past 3 to 4 months.   First feels dizzy,  blurred vision and HA then she passes out.  No  Shaking, no loss of bowel or bladder function during these episodes.  Episodes can last 2-3 hrs Can hear people calling her name around her but states that she cannot understand what they are saying. Episodes started while she was still living in [place].  Her mother died in 02/02/2023 of last year from a brain aneurysm.  When they relocated to the [place], [place]  [place] and nothing substantial found.  He does have some copies of documents with him.  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  1 time they sent her to the hospital in  to the emergency room there where she had CAT scan of the head done 11/11/2020.  The CAT scan was normal..   Patient completed high school in her country.  She reports feeling depressed and anxious all the time. "I',m thinking a lot and I don't know what's going on with me." Not eating well. Feels like crying a lot.  No thoughts of hurting herself but states that sometimes she feels like banging her head repeatedly against the wall.  She has found Elavil helpful.  It helps her to relax and she sleeps well at night since being on it.  Headaches have also decreased. Besides losing her mother last year, father denies that she has had any other traumatic life-changing events Patient Active Problem  List   Diagnosis Date Noted  . Influenza vaccine refused 01/14/2021  . Tetanus, diphtheria, and acellular pertussis (Tdap) vaccination declined 01/14/2021  . Syncope      Current Outpatient Medications on File Prior to Visit  Medication Sig Dispense Refill  . amitriptyline (ELAVIL) 25 MG tablet Take 2 tablets (50 mg total) by mouth at bedtime. 60 tablet 2   No current facility-administered medications on file prior to visit.    No Known Allergies  Social History   Socioeconomic History  . Marital  status: Unknown    Spouse name: Not on file  . Number of children: Not on file  . Years of education: Not on file  . Highest education level: Not on file  Occupational History  . Not on file  Tobacco Use  . Smoking status: Never Smoker  . Smokeless tobacco: Never Used  Vaping Use  . Vaping Use: Never used  Substance and Sexual Activity  . Alcohol use: Never  . Drug use: Not on file  . Sexual activity: Not on file  Other Topics Concern  . Not on file  Social History Narrative  . Not on file   Social Determinants of Health   Financial Resource Strain: Not on file  Food Insecurity: Not on file  Transportation Needs: Not on file  Physical Activity: Not on file  Stress: Not on file  Social Connections: Not on file  Intimate Partner Violence: Not on file    No family history on file.  No past surgical history on file.  ROS: Review of Systems Negative except as stated above  PHYSICAL EXAM: BP 119/78   Pulse (!) 107   Resp 16   Ht 5\' 5"  (1.651 m)   Wt 136 lb (61.7 kg)   SpO2 98%   BMI 22.63 kg/m   Physical Exam  General appearance - alert, well appearing, young female and in no distress Mental status -patient with flat affect.  She appears timid.   Chest - clear to auscultation, no wheezes, rales or rhonchi, symmetric air entry Heart - normal rate, regular rhythm, normal S1, S2, no murmurs, rubs, clicks or gallops Neurological - cranial nerves II through XII intact, motor and sensory grossly normal bilaterally Extremities - peripheral pulses normal, no pedal edema, no clubbing or cyanosis  Depression screen PHQ 2/9 01/14/2021  Decreased Interest 2  Down, Depressed, Hopeless 1  PHQ - 2 Score 3  Altered sleeping 0  Tired, decreased energy 2  Change in appetite 3  Feeling bad or failure about yourself  1  Trouble concentrating 1  Moving slowly or fidgety/restless 0  Suicidal thoughts 0  PHQ-9 Score 10   GAD 7 : Generalized Anxiety Score 01/14/2021   Control/stop worrying 2  Worry too much - different things 3  Trouble relaxing 2  Restless 3  Afraid - awful might happen 3      CMP Latest Ref Rng & Units 12/16/2020  Glucose 70 - 99 mg/dL 88  BUN 6 - 20 mg/dL 12  Creatinine 12/18/2020 - 3.90 mg/dL 3.00  Sodium 9.23 - 300 mmol/L 141  Potassium 3.5 - 5.1 mmol/L 4.2  Chloride 98 - 111 mmol/L 106  CO2 22 - 32 mmol/L 25  Calcium 8.9 - 10.3 mg/dL 9.5   Lipid Panel  No results found for: CHOL, TRIG, HDL, CHOLHDL, VLDL, LDLCALC, LDLDIRECT  CBC    Component Value Date/Time   WBC 9.9 12/16/2020 2207   RBC 4.57 12/16/2020 2207   HGB  13.3 12/16/2020 2207   HCT 40.1 12/16/2020 2207   PLT 195 12/16/2020 2207   MCV 87.7 12/16/2020 2207   MCH 29.1 12/16/2020 2207   MCHC 33.2 12/16/2020 2207   RDW 12.4 12/16/2020 2207    ASSESSMENT AND PLAN:  1. Syncope and collapse I will refer her to neurology and cardiology to rule out any organic causes.  I doubt seizures would last 2 to 3 hours.  There is a strong possibility that there is a psychiatric component to this so I will also refer her to behavioral health.  Father is agreeable [place] the patient.  She will continue the amitriptyline for now. - Ambulatory referral to Neurology - Ambulatory referral to Cardiology  2. Major depressive disorder, single episode, mild with anxious distress (HCC) See #1 above - Ambulatory referral to Psychiatry  3. Influenza vaccine refused   4. Tetanus, diphtheria, and acellular pertussis (Tdap) vaccination declined    Patient was given the opportunity to ask questions.  Patient verbalized understanding of the plan and was able to repeat key elements of the plan.  No orders of the defined types were placed in this encounter.    Requested Prescriptions    No prescriptions requested or ordered in this encounter    No follow-ups on file.  Jonah Blue, MD, FACP

## 2021-01-14 NOTE — Patient Instructions (Signed)
I have referred you to the neurologist, the cardiologist and therapist.  Continue the amitriptyline.

## 2021-01-17 ENCOUNTER — Encounter: Payer: Self-pay | Admitting: Nurse Practitioner

## 2021-01-18 ENCOUNTER — Encounter: Payer: Self-pay | Admitting: Neurology

## 2021-01-18 ENCOUNTER — Encounter: Payer: Self-pay | Admitting: Internal Medicine

## 2021-01-18 ENCOUNTER — Ambulatory Visit: Payer: Medicaid Other | Admitting: Neurology

## 2021-01-18 ENCOUNTER — Ambulatory Visit (INDEPENDENT_AMBULATORY_CARE_PROVIDER_SITE_OTHER): Payer: Medicaid Other | Admitting: Internal Medicine

## 2021-01-18 ENCOUNTER — Other Ambulatory Visit: Payer: Self-pay

## 2021-01-18 VITALS — BP 122/70 | HR 92 | Ht 63.0 in | Wt 136.0 lb

## 2021-01-18 VITALS — BP 106/74 | HR 109 | Ht 63.0 in | Wt 135.8 lb

## 2021-01-18 DIAGNOSIS — R55 Syncope and collapse: Secondary | ICD-10-CM

## 2021-01-18 DIAGNOSIS — R6889 Other general symptoms and signs: Secondary | ICD-10-CM

## 2021-01-18 DIAGNOSIS — G44209 Tension-type headache, unspecified, not intractable: Secondary | ICD-10-CM

## 2021-01-18 NOTE — Progress Notes (Signed)
Patient is able to speak and understand some [place].  Dr. Pearlean Brownie at bedside, patient also speaks Hindu and Dr. Pearlean Brownie believes an interpreter is not needed.

## 2021-01-18 NOTE — Patient Instructions (Signed)
I had a long discussion with the patient and her father regards during her recurrent episodes of brief loss of consciousness and unresponsiveness and discussed differential diagnosis and answered questions.  I recommend further evaluation with checking EEG for epileptiform activity and MRI scan of the brain and MRA of the brain and neck to look for any neurovascular causes.  She also complained of mild headaches which seem like tension headaches since I recommend she increase participation in regular activities for stress relaxation like meditation, yoga and exercises.  She will return for follow-up in the future in 2 months but prefers seeing lady Dr. due okay thank you to cultural issues and hence I recommend follow-up with Dr. Huston Foley.  Tension Headache, Adult A tension headache is a feeling of pain, pressure, or aching over the front and sides of the head. The pain can be dull, or it can feel tight. There are two types of tension headache:  Episodic tension headache. This is when the headaches happen fewer than 15 days a month.  Chronic tension headache. This is when the headaches happen more than 15 days a month during a 35-month period. A tension headache can last from 30 minutes to several days. It is the most common kind of headache. Tension headaches are not normally associated with nausea or vomiting, and they do not get worse with physical activity. What are the causes? The exact cause of this condition is not known. Tension headaches are often triggered by stress, anxiety, or depression. Other triggers may include:  Alcohol.  Too much caffeine or caffeine withdrawal.  Respiratory infections, such as colds, flu, or sinus infections.  Dental problems or teeth clenching.  Fatigue.  Holding your head and neck in the same position for a long period of time, such as while using a computer.  Smoking.  Arthritis of the neck. What are the signs or symptoms? Symptoms of this  condition include:  A feeling of pressure or tightness around the head.  Dull, aching head pain.  Pain over the front and sides of the head.  Tenderness in the muscles of the head, neck, and shoulders. How is this diagnosed? This condition may be diagnosed based on your symptoms, your medical history, and a physical exam. If your symptoms are severe or unusual, you may have imaging tests, such as a CT scan or an MRI of your head. Your vision may also be checked. How is this treated? This condition may be treated with lifestyle changes and with medicines that help relieve symptoms. Follow these instructions at home: Managing pain  Take over-the-counter and prescription medicines only as told by your health care provider.  When you have a headache, lie down in a dark, quiet room.  If directed, put ice on your head and neck. To do this: ? Put ice in a plastic bag. ? Place a towel between your skin and the bag. ? Leave the ice on for 20 minutes, 2-3 times a day. ? Remove the ice if your skin turns bright red. This is very important. If you cannot feel pain, heat, or cold, you have a greater risk of damage to the area.  If directed, apply heat to the back of your neck as often as told by your health care provider. Use the heat source that your health care provider recommends, such as a moist heat pack or a heating pad. ? Place a towel between your skin and the heat source. ? Leave the heat on for  20-30 minutes. ? Remove the heat if your skin turns bright red. This is especially important if you are unable to feel pain, heat, or cold. You have a greater risk of getting burned. Eating and drinking  Eat meals on a regular schedule.  If you drink alcohol: ? Limit how much you have to:  0-1 drink a day for women who are not pregnant.  0-2 drinks a day for men. ? Know how much alcohol is in your drink. In the [place] one drink equals one 12 oz bottle of beer (355 mL), one 5 oz glass of  wine (148 mL), or one 1 oz glass of hard liquor (44 mL).  Drink enough fluid to keep your urine pale yellow.  Decrease your caffeine intake, or stop using caffeine. Lifestyle  Get 7-9 hours of sleep each night, or get the amount of sleep recommended by your health care provider.  At bedtime, remove computers, phones, and tablets from your room.  Find ways to manage your stress. This may include: ? Exercise. ? Deep breathing exercises. ? Yoga. ? Listening to music. ? Positive mental imagery.  Try to sit up straight and avoid tensing your muscles.  Do not use any products that contain nicotine or tobacco. These include cigarettes, chewing tobacco, and vaping devices, such as e-cigarettes. If you need help quitting, ask your health care provider. General instructions  Avoid any headache triggers. Keep a journal to help find out what may trigger your headaches. For example, write down: ? What you eat and drink. ? How much sleep you get. ? Any change to your diet or medicines.  Keep all follow-up visits. This is important.   Contact a health care provider if:  Your headache does not get better.  Your headache comes back.  You are sensitive to sounds, light, or smells because of a headache.  You have nausea or you vomit.  Your stomach hurts. Get help right away if:  You suddenly develop a severe headache, along with any of the following: ? A stiff neck. ? Nausea and vomiting. ? Confusion. ? Weakness in one part or one side of your body. ? Double vision or loss of vision. ? Shortness of breath. ? Rash. ? Unusual sleepiness. ? Fever or chills. ? Trouble speaking. ? Pain in your eye or ear. ? Trouble walking or balancing. ? Feeling faint or passing out. Summary  A tension headache is a feeling of pain, pressure, or aching over the front and sides of the head.  A tension headache can last from 30 minutes to several days. It is the most common kind of  headache.  This condition may be diagnosed based on your symptoms, your medical history, and a physical exam.  This condition may be treated with lifestyle changes and with medicines that help relieve symptoms. This information is not intended to replace advice given to you by your health care provider. Make sure you discuss any questions you have with your health care provider. Document Revised: 08/12/2020 Document Reviewed: 08/12/2020 Elsevier Patient Education  2021 [employer].

## 2021-01-18 NOTE — Progress Notes (Signed)
Cardiology Office Note:    Date:  01/18/2021   ID:  Bethany Harris, DOB November 21, 2001, MRN [id number]  PCP:  Patient, No Pcp Per   [facility]  Cardiologist:  No primary care provider on file.  Advanced Practice Provider:  No care team member to display Electrophysiologist:  None       CC: Near syncope Consulted for the evaluation of syncope at the behest of Dr. Laural Benes  History of Present Illness:    Bethany Harris is a 20 y.o. female with a hx of near syncope and congenital heart disease in the family who presents for evaluation 01/18/21.  Patient notes that she is feeling good.  Has had a history of almost passing out over the past year. Has had no chest pain, chest pressure, chest tightness, chest stinging.  Patient had near syncope that was associated with a headache prior, after this she felt dizzy and had to lay down.  Appears to have an aura prior. After this would sleep for several hours. Twice has passed out from standing (back in [place]) but never with exercise or activity.  Patient exertion notable for doing housework and feels no symptoms.  No shortness of breath, DOE .  No PND or orthopnea.  No bendopnea, weight gain, leg swelling , or abdominal swelling.  . Notes  no palpitations or funny heart beats.   Syncope has improved with Elavil.  Last episode was was 12/16/20.  Had near syncopal event with minimal responsiveness.  No loss of bowel or bladder.  Went to ED and had relatively unremarkable course.  Has not passed out since.  Mother had syncope and died after hitting her head from a syncopal episode.  Past Medical History:  Diagnosis Date  . GERD (gastroesophageal reflux disease)     History reviewed. No pertinent surgical history.  Current Medications: Current Meds  Medication Sig  . acetaminophen (TYLENOL) 500 MG tablet Take 500 mg by mouth as needed. Taking for tooth pain  . amitriptyline (ELAVIL) 25 MG tablet Take 2 tablets (50 mg total) by  mouth at bedtime.  Marland Kitchen amoxicillin (AMOXIL) 500 MG capsule Take 500 mg by mouth 3 (three) times daily.     Allergies:   Patient has no known allergies.   Social History   Socioeconomic History  . Marital status: Unknown    Spouse name: Not on file  . Number of children: Not on file  . Years of education: Not on file  . Highest education level: Not on file  Occupational History  . Not on file  Tobacco Use  . Smoking status: Never Smoker  . Smokeless tobacco: Never Used  Vaping Use  . Vaping Use: Never used  Substance and Sexual Activity  . Alcohol use: Never  . Drug use: Never  . Sexual activity: Not on file  Other Topics Concern  . Not on file  Social History Narrative   ** Merged History Encounter **       Social Determinants of Health   Financial Resource Strain: Not on file  Food Insecurity: Not on file  Transportation Needs: Not on file  Physical Activity: Not on file  Stress: Not on file  Social Connections: Not on file     Family History: Patient's mother had a history of fainting spells.  Saw a cardiologist in [place] with normal cardiac work up.  She passed out, hit her head and died while in [place].  Per Chart review may have had a  ruptured aneurysm.  ROS:   Please see the history of present illness.     All other systems reviewed and are negative.  EKGs/Labs/Other Studies Reviewed:    The following studies were reviewed today:  EKG:   01/18/21: Sinus Tachycardia rate 109, QTc436  Recent Labs: 12/16/2020: BUN 12; Creatinine, Ser 0.62; Hemoglobin 13.3; Platelets 195; Potassium 4.2; Sodium 141 01/03/2021: TSH 1.140  Recent Lipid Panel No results found for: CHOL, TRIG, HDL, CHOLHDL, VLDL, LDLCALC, LDLDIRECT   Risk Assessment/Calculations:     N/A  Physical Exam:    VS:  BP 106/74   Pulse (!) 109   Ht 5\' 3"  (1.6 m)   Wt 135 lb 12.8 oz (61.6 kg)   SpO2 98%   BMI 24.06 kg/m     Wt Readings from Last 3 Encounters:  01/18/21 135 lb 12.8  oz (61.6 kg) (65 %, Z= 0.38)*  01/14/21 136 lb (61.7 kg) (65 %, Z= 0.39)*  01/03/21 134 lb (60.8 kg) (62 %, Z= 0.31)*   * Growth percentiles are based on CDC (Girls, 2-20 Years) data.    GEN: Well nourished, well developed in no acute distress HEENT: Normal NECK: No JVD LYMPHATICS: Patient deferred CARDIAC: Patient deferred  RESPIRATORY:  Clear to auscultation without rales, wheezing or rhonchi  ABDOMEN: Soft, non-tender, non-distended MUSCULOSKELETAL:  Patient deferred SKIN: Patient deferred NEUROLOGIC:  Alert and oriented x 3 PSYCHIATRIC:  Anxious affect   ASSESSMENT:    1. Near syncope    PLAN:    In order of problems listed above:  Syncope/Near Syncope  - no associated with tachycardia - will start with echocardiogram for evaluation (will need female provider) - no LTQs associated triggers  ~ Three month follow up unless new symptoms or abnormal test results warranting change in plan Per patient and family; OK to share information with 03/03/21- patient advocate Would like to establish with a female doctor; we will facilitate this        Medication Adjustments/Labs and Tests Ordered: Current medicines are reviewed at length with the patient today.  Concerns regarding medicines are outlined above.  Orders Placed This Encounter  Procedures  . EKG 12-Lead  . ECHOCARDIOGRAM COMPLETE   No orders of the defined types were placed in this encounter.   Patient Instructions  Medication Instructions:  Your physician recommends that you continue on your current medications as directed. Please refer to the Current Medication list given to you today.  *If you need a refill on your cardiac medications before your next appointment, please call your pharmacy*   Lab Work: NONE If you have labs (blood work) drawn today and your tests are completely normal, you will receive your results only by: Jacolyn Reedy MyChart Message (if you have MyChart) OR . A paper copy in the  mail If you have any lab test that is abnormal or we need to change your treatment, we will call you to review the results.   Testing/Procedures: Your physician has requested that you have an echocardiogram. Echocardiography is a painless test that uses sound waves to create images of your heart. It provides your doctor with information about the size and shape of your heart and how well your heart's chambers and valves are working. This procedure takes approximately one hour. There are no restrictions for this procedure.     Follow-Up: At [facility], you and your health needs are our priority.  As part of our continuing mission to provide you with exceptional heart care,  we have created designated Provider Care Teams.  These Care Teams include your primary Cardiologist (physician) and Advanced Practice Providers (APPs -  Physician Assistants and Nurse Practitioners) who all work together to provide you with the care you need, when you need it.  We recommend signing up for the patient portal called "MyChart".  Sign up information is provided on this After Visit Summary.  MyChart is used to connect with patients for Virtual Visits (Telemedicine).  Patients are able to view lab/test results, encounter notes, upcoming appointments, etc.  Non-urgent messages can be sent to your provider as well.   To learn more about what you can do with MyChart, go to [email or web address].    Your next appointment:   3 month(s)  The format for your next appointment:   In Person  Provider:   You may see A FEMALE Cardiologist or one of the following Advanced Practice Providers on your designated Care Team:    Ronie Spies, PA-C  Jacolyn Reedy, PA-C          Signed, Christell Constant, MD  01/18/2021 2:49 PM    [facility]

## 2021-01-18 NOTE — Progress Notes (Signed)
[facility] [address] [place]. [place] [address] [phone]       OFFICE CONSULT NOTE  [name] Date of Birth:  04/04/2001 Medical Record Number:  [id number]   Referring MD: Jonah Blue  Reason for Referral: Syncope HPI: [name] is a 20 year old African refugee girl who is seen today for initial office consultation visit.  History is obtained from the patient and her father who interprets for her.  The Farsi language interpreter could not make it for this appointment.  Both patient and daughter speak a little Hindi in order to and I was able to communicate with them with my knowledge of these languages.  Patient has been having recurrent episodes of passing out for the last 6 months.  These episodes are quite stereotypical.  The patient complains of a headache before the episode and then states she wants to lie down.  She is quite unresponsive and even when water is splashed on her face she does not respond.  Episodes have lasted about 10 minutes to about an hour or 2.  During 1 of these episodes [facility] [place] and CT scan of the head was obtained which was unremarkable.  Basic lab work was unremarkable except she had a UTI for which she was started on Keflex and.  Patient has not had any tonic-clonic activity, tongue bite or injured herself during these episodes.  The patient states that during this episode she is unable to respond but she can can hear people calling out her name.  She is also having thoughts about her mother who is deceased during these episodes.  The patient's mother died of brain hemorrhage but I was not able to clearly establish whether this was traumatic or related to the aneurysm and she died at a young age of 74.  Patient has 6 other siblings and she is currently at home looking after them.  She states she has had some headaches off and on but they seem to be getting better in the last 2 weeks.  The headaches are  usually bifrontal occasionally generalized they can be pressure-like at times they are throbbing.  Over-the-counter medications like Tylenol seem to help.  She was recently seen in the ER on 12/17/2020 by Dr. Amada Jupiter and patient had CT angiogram of the brain and neck and CT scan all of which were unremarkable.  Outpatient EEG was suggested but has not yet been scheduled.  She saw cardiologist Dr. Izora Ribas today was planning echocardiogram and cardiac work-up.  ROS:   14 system review of systems is positive for headache, loss of consciousness, unresponsiveness all other systems negative  PMH:  Past Medical History:  Diagnosis Date  . GERD (gastroesophageal reflux disease)     Social History:  Social History   Socioeconomic History  . Marital status: Single    Spouse name: Not on file  . Number of children: Not on file  . Years of education: Not on file  . Highest education level: Not on file  Occupational History  . Occupation: Consulting civil engineer  Tobacco Use  . Smoking status: Never Smoker  . Smokeless tobacco: Never Used  Vaping Use  . Vaping Use: Never used  Substance and Sexual Activity  . Alcohol use: Never  . Drug use: Never  . Sexual activity: Not on file  Other Topics Concern  . Not on file  Social History Narrative   Lives with dad and 6 other siblings  Right handed   Drinks caffeine rarely   Social Determinants of Health   Financial Resource Strain: Not on file  Food Insecurity: Not on file  Transportation Needs: Not on file  Physical Activity: Not on file  Stress: Not on file  Social Connections: Not on file  Intimate Partner Violence: Not on file    Medications:   Current Outpatient Medications on File Prior to Visit  Medication Sig Dispense Refill  . acetaminophen (TYLENOL) 500 MG tablet Take 500 mg by mouth as needed. Taking for tooth pain    . amitriptyline (ELAVIL) 25 MG tablet Take 2 tablets (50 mg total) by mouth at bedtime. 60 tablet 2  .  amoxicillin (AMOXIL) 500 MG capsule Take 500 mg by mouth 3 (three) times daily.     No current facility-administered medications on file prior to visit.    Allergies:  No Known Allergies  Physical Exam General: well developed, well nourished young African girl, seated, in no evident distress Head: head normocephalic and atraumatic.   Neck: supple with no carotid or supraclavicular bruits Cardiovascular: regular rate and rhythm, no murmurs Musculoskeletal: no deformity Skin:  no rash/petichiae Vascular:  Normal pulses all extremities  Neurologic Exam limited due to language barrier Mental Status: Awake and fully alert. Oriented to place and time. Recent and remote memory intact. Attention span, concentration and fund of knowledge appropriate. Mood and affect appropriate.  Cranial Nerves: Fundoscopic exam reveals sharp disc margins. Pupils equal, briskly reactive to light. Extraocular movements full without nystagmus. Visual fields full to confrontation. Hearing intact. Facial sensation intact. Face, tongue, palate moves normally and symmetrically.  Motor: Normal bulk and tone. Normal strength in all tested extremity muscles. Sensory.: intact to touch , pinprick , position and vibratory sensation.  Coordination: Rapid alternating movements normal in all extremities. Finger-to-nose and heel-to-shin performed accurately bilaterally. Gait and Station: Arises from chair without difficulty. Stance is normal. Gait demonstrates normal stride length and balance . Able to heel, toe and tandem walk without difficulty.  Reflexes: 1+ and symmetric. Toes downgoing.       ASSESSMENT: 20 year old African girl with recurrent episodes of transient loss of consciousness and unresponsiveness of unclear etiology.  Possibly nonepileptic spells triggered by stress.  Nonfocal neurological exam and cardiac and neurovascular work-up is pending     PLAN: I had a long discussion with the patient and her father  regards during her recurrent episodes of brief loss of consciousness and unresponsiveness and discussed differential diagnosis and answered questions.  I recommend further evaluation with checking EEG for epileptiform activity and MRI scan of the brain to look for structural brain causes.  She also complained of mild headaches which seem like tension headaches since I recommend she increase participation in regular activities for stress relaxation like meditation, yoga and exercises.  She will return for follow-up in the future in 2 months but prefers seeing lady Dr. due   to cultural issues and hence I recommend follow-up with Dr. Huston Foley.  Greater than 50% time during this 45-minute consultation visit were spent on counseling and coordination of care about her episodes of recurrent brief loss of consciousness and answering questions. Delia Heady, MD     01/18/2021 4:41 PM  Note: This document was prepared with digital dictation and possible smart phrase technology. Any transcriptional errors that result from this process are unintentional.

## 2021-01-18 NOTE — Patient Instructions (Signed)
Medication Instructions:  Your physician recommends that you continue on your current medications as directed. Please refer to the Current Medication list given to you today.  *If you need a refill on your cardiac medications before your next appointment, please call your pharmacy*   Lab Work: NONE If you have labs (blood work) drawn today and your tests are completely normal, you will receive your results only by: Marland Kitchen MyChart Message (if you have MyChart) OR . A paper copy in the mail If you have any lab test that is abnormal or we need to change your treatment, we will call you to review the results.   Testing/Procedures: Your physician has requested that you have an echocardiogram. Echocardiography is a painless test that uses sound waves to create images of your heart. It provides your doctor with information about the size and shape of your heart and how well your heart's chambers and valves are working. This procedure takes approximately one hour. There are no restrictions for this procedure.     Follow-Up: At [facility], you and your health needs are our priority.  As part of our continuing mission to provide you with exceptional heart care, we have created designated Provider Care Teams.  These Care Teams include your primary Cardiologist (physician) and Advanced Practice Providers (APPs -  Physician Assistants and Nurse Practitioners) who all work together to provide you with the care you need, when you need it.  We recommend signing up for the patient portal called "MyChart".  Sign up information is provided on this After Visit Summary.  MyChart is used to connect with patients for Virtual Visits (Telemedicine).  Patients are able to view lab/test results, encounter notes, upcoming appointments, etc.  Non-urgent messages can be sent to your provider as well.   To learn more about what you can do with MyChart, go to [email or web address].    Your next appointment:   3  month(s)  The format for your next appointment:   In Person  Provider:   You may see A FEMALE Cardiologist or one of the following Advanced Practice Providers on your designated Care Team:    Ronie Spies, PA-C  Jacolyn Reedy, PA-C

## 2021-01-18 NOTE — Congregational Nurse Program (Signed)
  Dept: [phone]   Congregational Nurse Program Note  Date of Encounter: 01/18/2021  Past Medical History: Past Medical History:  Diagnosis Date  . GERD (gastroesophageal reflux disease)     Encounter Details:  Patient presented with toothache. Cavity noted on examination. She has been seen by dentist and given amoxicillin but has not completed course. States she took 4 tylenol last night but did not help pain. Educated patient about correct dosage and frequency of tylenol and provided with written reminder. Counseled patient to complete antibiotic as prescribed.    Arman Bogus RN BSn PCCN  [facility] [phone]

## 2021-01-19 ENCOUNTER — Telehealth: Payer: Self-pay | Admitting: Neurology

## 2021-01-19 NOTE — Telephone Encounter (Signed)
[facility] [place]: [phone], [phone] & [phone] (exp. 01/19/21 to 03/05/21). Order sent to GI. They will reach out to the patient to schedule,.

## 2021-02-02 ENCOUNTER — Other Ambulatory Visit: Payer: Self-pay

## 2021-02-07 ENCOUNTER — Telehealth: Payer: Self-pay | Admitting: Neurology

## 2021-02-07 ENCOUNTER — Other Ambulatory Visit: Payer: Self-pay | Admitting: Neurology

## 2021-02-07 MED ORDER — ALPRAZOLAM 0.25 MG PO TABS: 0.2500 mg | ORAL_TABLET | Freq: Once | ORAL | 0 refills | Status: AC

## 2021-02-07 NOTE — Telephone Encounter (Signed)
Okay I will order Xanax 0.25 mg 2 tablets

## 2021-02-07 NOTE — Telephone Encounter (Signed)
[facility] [place], [place]) called Pt having an MRA on 02/10/21. Pt is claustrophobia, need medication to take before MRA,  Send prescription to [facility] #[id number]   Would like a call from the nurse.  Contact info: [phone]

## 2021-02-08 ENCOUNTER — Other Ambulatory Visit: Payer: Self-pay | Admitting: Neurology

## 2021-02-08 ENCOUNTER — Telehealth: Payer: Self-pay | Admitting: Pediatric Intensive Care

## 2021-02-08 MED ORDER — ALPRAZOLAM 0.25 MG PO TABS: 0.2500 mg | ORAL_TABLET | ORAL | 0 refills | Status: AC

## 2021-02-08 NOTE — Telephone Encounter (Signed)
Thanks for correcting that

## 2021-02-08 NOTE — Telephone Encounter (Signed)
Called back and spoke w/ Herma Carson, PA. Advised Dr. Pearlean Brownie sent in xanax to [facility] at [employer]. She advised this was the incorrect pharmacy. Would like it resent to: [facility] #[id number] - [place], [place] - [address] AT [facility] [address]   I called [facility] #[id number] - [place], [place] - [address] AT [facility] [address] sent in. Spoke with [employer].

## 2021-02-08 NOTE — Telephone Encounter (Signed)
Via interpreter Hedda Slade interpretation in [place]. Spoke with client's father. CN requested that client meet with her between 10-12 pm next week at [facility]. Shann Medal RN BSN CNP [phone]

## 2021-02-09 ENCOUNTER — Ambulatory Visit (HOSPITAL_COMMUNITY): Payer: Medicaid Other | Attending: Cardiology

## 2021-02-09 ENCOUNTER — Other Ambulatory Visit: Payer: Self-pay

## 2021-02-09 DIAGNOSIS — R55 Syncope and collapse: Secondary | ICD-10-CM | POA: Insufficient documentation

## 2021-02-09 LAB — ECHOCARDIOGRAM COMPLETE
Area-P 1/2: 4.89 cm2
S' Lateral: 2.6 cm

## 2021-02-09 MED ORDER — ALPRAZOLAM 0.5 MG PO TABS: ORAL_TABLET | ORAL | 0 refills | Status: DC

## 2021-02-09 NOTE — Telephone Encounter (Signed)
The Xanax prescription was sent in.

## 2021-02-10 ENCOUNTER — Ambulatory Visit
Admission: RE | Admit: 2021-02-10 | Discharge: 2021-02-10 | Disposition: A | Discharge status: Home / Self Care | Payer: Medicaid Other | Source: Ambulatory Visit | Attending: Neurology | Admitting: Neurology

## 2021-02-10 DIAGNOSIS — R6889 Other general symptoms and signs: Secondary | ICD-10-CM

## 2021-02-10 MED ORDER — GADOBENATE DIMEGLUMINE 529 MG/ML IV SOLN
12.0000 mL | Freq: Once | INTRAVENOUS | Status: AC | PRN
  Administered 2021-02-10: 12 mL via INTRAVENOUS

## 2021-02-11 ENCOUNTER — Other Ambulatory Visit: Payer: Self-pay

## 2021-02-11 ENCOUNTER — Ambulatory Visit (INDEPENDENT_AMBULATORY_CARE_PROVIDER_SITE_OTHER): Payer: Medicaid Other | Admitting: Nurse Practitioner

## 2021-02-11 ENCOUNTER — Encounter: Payer: Self-pay | Admitting: Nurse Practitioner

## 2021-02-11 VITALS — BP 107/67 | HR 106 | Temp 98.4°F | Ht 63.0 in | Wt 130.0 lb

## 2021-02-11 DIAGNOSIS — R21 Rash and other nonspecific skin eruption: Secondary | ICD-10-CM | POA: Diagnosis not present

## 2021-02-11 DIAGNOSIS — B360 Pityriasis versicolor: Secondary | ICD-10-CM | POA: Diagnosis not present

## 2021-02-11 MED ORDER — MICONAZOLE NITRATE 2 % EX CREA: 1.0000 "application " | TOPICAL_CREAM | Freq: Two times a day (BID) | CUTANEOUS | 0 refills | Status: DC

## 2021-02-11 MED ORDER — TRIAMCINOLONE ACETONIDE 0.5 % EX OINT: 1.0000 "application " | TOPICAL_OINTMENT | Freq: Two times a day (BID) | CUTANEOUS | 0 refills | Status: DC

## 2021-02-11 NOTE — Progress Notes (Signed)
   Acute Office Visit   Subjective:     Chicquita Mendel is a 20 y.o. female who presents for evaluation of a rash involving the chest and upper extremity. Rash started 5 months ago. Lesions are erythema, and raised in texture. Rash has changed over time. Rash is pruritic. Associated symptoms: none. Patient denies: abdominal pain, arthralgia, congestion, cough, decrease in appetite, fever, headache, irritability, myalgia and nausea. Patient has not had contacts with similar rash. Patient has not had new exposures (soaps, lotions, laundry detergents, foods, medications, plants, insects or animals).  When the rash initially started in October she admits that she was given a different vaccinations along with clothing that was provided however she was the only one that experienced the symptoms   The following portions of the patient's history were reviewed and updated as appropriate: allergies, current medications, past family history, past medical history, past social history, past surgical history and problem list.  Review of Systems Constitutional: negative Respiratory: negative Cardiovascular: negative    Objective:    BP 107/67   Pulse (!) 106   Temp 98.4 F (36.9 C) (Temporal)   Ht 5\' 3"  (1.6 m)   Wt 130 lb (59 kg)   LMP 02/03/2021   SpO2 98%   BMI 23.03 kg/m  General:  alert, cooperative and no distress  Skin:  normal and Small patches slightly darker than skin tone to upper chest and breast area small amount shoulder minimal to none noted to back  With red papules to upper torso bilateral upper extremities especially upper arms and upper back     Assessment:    acne, dermatitis and tinea versicolor    Plan:    Medications: steroids: Triamcinolone and Miconazole . Verbal patient instruction given. Follow up in 2 months. Already scheduled

## 2021-02-11 NOTE — Patient Instructions (Signed)
Tinea Versicolor  Tinea versicolor is a skin infection. It is caused by a type of yeast. It is normal for some yeast to be on your skin, but too much yeast causes this infection. The infection causes a rash of light or dark patches on your skin. The rash is most common on the chest, back, neck, or upper arms. The infection usually does not cause other problems. If it is treated, it will probably go away in a few weeks. The infection cannot be spread from one person to another (is not contagious). Follow these instructions at home:  Use over-the-counter and prescription medicines only as told by your doctor.  Scrub your skin every day with dandruff shampoo as told by your doctor.  Do not scratch your skin in the rash area.  Avoid places that are hot and humid.  Do not use tanning booths.  Try to avoid sweating a lot. Contact a doctor if:  Your symptoms get worse.  You have a fever.  You have redness, swelling, or pain in the rash area.  You have fluid or blood coming from your rash.  Your rash feels warm to the touch.  You have pus or a bad smell coming from your rash.  Your rash comes back (recurs) after treatment. Summary  Tinea versicolor is a skin infection. It causes a rash of light or dark patches on your skin.  The rash is most common on the chest, back, neck, or upper arms. This infection usually does not cause other problems.  Use over-the-counter and prescription medicines only as told by your doctor.  If the infection is treated, it will probably go away in a few weeks. This information is not intended to replace advice given to you by your health care provider. Make sure you discuss any questions you have with your health care provider. Document Revised: 09/08/2020 Document Reviewed: 09/08/2020 Elsevier Patient Education  2021 Elsevier Inc.   Contact Dermatitis Dermatitis is redness, soreness, and swelling (inflammation) of the skin. Contact dermatitis is a  reaction to something that touches the skin. There are two types of contact dermatitis:  Irritant contact dermatitis. This happens when something bothers (irritates) your skin, like soap.  Allergic contact dermatitis. This is caused when you are exposed to something that you are allergic to, such as poison ivy. What are the causes?  Common causes of irritant contact dermatitis include: ? Makeup. ? Soaps. ? Detergents. ? Bleaches. ? Acids. ? Metals, such as nickel.  Common causes of allergic contact dermatitis include: ? Plants. ? Chemicals. ? Jewelry. ? Latex. ? Medicines. ? Preservatives in products, such as clothing. What increases the risk?  Having a job that exposes you to things that bother your skin.  Having asthma or eczema. What are the signs or symptoms? Symptoms may happen anywhere the irritant has touched your skin. Symptoms include:  Dry or flaky skin.  Redness.  Cracks.  Itching.  Pain or a burning feeling.  Blisters.  Blood or clear fluid draining from skin cracks. With allergic contact dermatitis, swelling may occur. This may happen in places such as the eyelids, mouth, or genitals.   How is this treated?  This condition is treated by checking for the cause of the reaction and protecting your skin. Treatment may also include: ? Steroid creams, ointments, or medicines. ? Antibiotic medicines or other ointments, if you have a skin infection. ? Lotion or medicines to help with itching. ? A bandage (dressing). Follow these instructions at  home: Skin care  Moisturize your skin as needed.  Put cool cloths on your skin.  Put a baking soda paste on your skin. Stir water into baking soda until it looks like a paste.  Do not scratch your skin.  Avoid having things rub up against your skin.  Avoid the use of soaps, perfumes, and dyes. Medicines  Take or apply over-the-counter and prescription medicines only as told by your doctor.  If you were  prescribed an antibiotic medicine, take or apply it as told by your doctor. Do not stop using it even if your condition starts to get better. Bathing  Take a bath with: ? Epsom salts. ? Baking soda. ? Colloidal oatmeal.  Bathe less often.  Bathe in warm water. Avoid using hot water. Bandage care  If you were given a bandage, change it as told by your health care provider.  Wash your hands with soap and water before and after you change your bandage. If soap and water are not available, use hand sanitizer. General instructions  Avoid the things that caused your reaction. If you do not know what caused it, keep a journal. Write down: ? What you eat. ? What skin products you use. ? What you drink. ? What you wear in the area that has symptoms. This includes jewelry.  Check the affected areas every day for signs of infection. Check for: ? More redness, swelling, or pain. ? More fluid or blood. ? Warmth. ? Pus or a bad smell.  Keep all follow-up visits as told by your doctor. This is important. Contact a doctor if:  You do not get better with treatment.  Your condition gets worse.  You have signs of infection, such as: ? More swelling. ? Tenderness. ? More redness. ? Soreness. ? Warmth.  You have a fever.  You have new symptoms. Get help right away if:  You have a very bad headache.  You have neck pain.  Your neck is stiff.  You throw up (vomit).  You feel very sleepy.  You see red streaks coming from the area.  Your bone or joint near the area hurts after the skin has healed.  The area turns darker.  You have trouble breathing. Summary  Dermatitis is redness, soreness, and swelling of the skin.  Symptoms may occur where the irritant has touched you.  Treatment may include medicines and skin care.  If you do not know what caused your reaction, keep a journal.  Contact a doctor if your condition gets worse or you have signs of infection. This  information is not intended to replace advice given to you by your health care provider. Make sure you discuss any questions you have with your health care provider. Document Revised: 03/05/2019 Document Reviewed: 05/29/2018 Elsevier Patient Education  2021 [employer].

## 2021-02-13 NOTE — Progress Notes (Signed)
Kindly inform the patient her MRI scan of the brain was normal.   MR angiogram study of the neck blood vessels showed no major blockages in the neck to worry about.   MR angiogram study of the brain vessels showed a small area of narrowing of one of the blood vessels before it enters the brain but there is adequate flow beyond it.  This is of unclear significance and no further action is needed at this time.

## 2021-02-15 ENCOUNTER — Other Ambulatory Visit: Payer: Self-pay

## 2021-02-16 ENCOUNTER — Encounter: Payer: Self-pay | Admitting: *Deleted

## 2021-02-16 ENCOUNTER — Telehealth: Payer: Self-pay | Admitting: *Deleted

## 2021-02-16 NOTE — Telephone Encounter (Signed)
[id number] interpreters,  [place], [phone] who spoke with patient and informed her MRI scan of the brain was normal.  MR angiogram study of the neck blood vessels showed no major blockages in the neck to worry about. The MR angiogram study of the brain vessels showed a small area of narrowing of one of the blood vessels before it enters the brain but there is adequate flow beyond it. This is of unclear significance and no further action is needed at this time.  Answered question re: small vessel narrowing  to her stated satisfaction. Patient verbalized understanding, appreciation.

## 2021-03-01 ENCOUNTER — Other Ambulatory Visit: Payer: Self-pay

## 2021-03-01 ENCOUNTER — Ambulatory Visit: Payer: Medicaid Other | Admitting: Neurology

## 2021-03-01 DIAGNOSIS — R6889 Other general symptoms and signs: Secondary | ICD-10-CM

## 2021-03-01 DIAGNOSIS — R55 Syncope and collapse: Secondary | ICD-10-CM | POA: Diagnosis not present

## 2021-03-02 ENCOUNTER — Encounter (HOSPITAL_COMMUNITY): Payer: Self-pay | Admitting: Family

## 2021-03-02 ENCOUNTER — Other Ambulatory Visit: Payer: Self-pay

## 2021-03-02 ENCOUNTER — Ambulatory Visit (HOSPITAL_COMMUNITY): Payer: Medicaid Other | Admitting: Family

## 2021-03-02 ENCOUNTER — Ambulatory Visit (HOSPITAL_COMMUNITY): Payer: Medicaid Other | Admitting: Physician Assistant

## 2021-03-02 ENCOUNTER — Encounter (HOSPITAL_COMMUNITY): Payer: Self-pay

## 2021-03-02 VITALS — BP 104/64 | HR 79 | Ht 63.0 in | Wt 128.0 lb

## 2021-03-02 NOTE — Progress Notes (Deleted)
Psychiatric Initial Adult Assessment   Patient Identification: Bethany Harris MRN:  [id number] Date of Evaluation:  03/02/2021 Referral Source:  Chief Complaint:   Chief Complaint    Medication Management     Visit Diagnosis: No diagnosis found.  History of Present Illness:  ***  Associated Signs/Symptoms: Depression Symptoms:  depressed mood, difficulty concentrating, anxiety, (Hypo) Manic Symptoms:  {BHH MANIC SYMPTOMS:22872} Anxiety Symptoms:  {BHH ANXIETY SYMPTOMS:22873} Psychotic Symptoms:  {BHH PSYCHOTIC SYMPTOMS:22874} PTSD Symptoms: {BHH PTSD SYMPTOMS:22875}  Past Psychiatric History: ***  Previous Psychotropic Medications: {YES/NO:21197}  Substance Abuse History in the last 12 months:  {yes no:314532}  Consequences of Substance Abuse: {BHH CONSEQUENCES OF SUBSTANCE ABUSE:22880}  Past Medical History:  Past Medical History:  Diagnosis Date  . GERD (gastroesophageal reflux disease)    No past surgical history on file.  Family Psychiatric History: ***  Family History: No family history on file.  Social History:   Social History   Socioeconomic History  . Marital status: Single    Spouse name: Not on file  . Number of children: Not on file  . Years of education: Not on file  . Highest education level: Not on file  Occupational History  . Occupation: Consulting civil engineer  Tobacco Use  . Smoking status: Never Smoker  . Smokeless tobacco: Never Used  Vaping Use  . Vaping Use: Never used  Substance and Sexual Activity  . Alcohol use: Never  . Drug use: Never  . Sexual activity: Not on file  Other Topics Concern  . Not on file  Social History Narrative   Lives with dad and 6 other siblings   Right handed   Drinks caffeine rarely   Social Determinants of Health   Financial Resource Strain: Not on file  Food Insecurity: Not on file  Transportation Needs: Not on file  Physical Activity: Not on file  Stress: Not on file  Social Connections: Not on file     Additional Social History: ***  Allergies:  No Known Allergies  Metabolic Disorder Labs: No results found for: HGBA1C, MPG No results found for: PROLACTIN No results found for: CHOL, TRIG, HDL, CHOLHDL, VLDL, LDLCALC Lab Results  Component Value Date   TSH 1.140 01/03/2021    Therapeutic Level Labs: No results found for: LITHIUM No results found for: CBMZ No results found for: VALPROATE  Current Medications: Current Outpatient Medications  Medication Sig Dispense Refill  . amitriptyline (ELAVIL) 25 MG tablet Take 2 tablets (50 mg total) by mouth at bedtime. 60 tablet 2  . miconazole (MICOTIN) 2 % cream Apply 1 application topically 2 (two) times daily. 28.35 g 0  . triamcinolone ointment (KENALOG) 0.5 % Apply 1 application topically 2 (two) times daily. 30 g 0   No current facility-administered medications for this visit.    Musculoskeletal: Strength & Muscle Tone: {desc; muscle tone:32375} Gait & Station: {PE GAIT ED QQVZ:56387} Patient leans: {Patient Leans:21022755}  Psychiatric Specialty Exam: Review of Systems  Blood pressure 104/64, pulse 79, height 5\' 3"  (1.6 m), weight 128 lb (58.1 kg), last menstrual period 02/03/2021, SpO2 99 %.Body mass index is 22.67 kg/m.  General Appearance: Casual  Eye Contact:  Good  Speech:  Clear and Coherent  Volume:  Normal  Mood:  Anxious and Depressed  Affect:  Congruent  Thought Process:  Coherent  Orientation:  Full (Time, Place, and Person)  Thought Content:  Logical  Suicidal Thoughts:  No  Homicidal Thoughts:  No  Memory:  Immediate;   Fair Recent;  Fair  Judgement:  Fair  Insight:  Fair  Psychomotor Activity:  Normal  Concentration:  Concentration: Fair  Recall:  [employer] of Knowledge:Good  Language: Fair  Akathisia:  No  Handed:  Right  AIMS (if indicated):   Assets:  Communication Skills Desire for Improvement Resilience Social Support  ADL's:  Intact  Cognition: WNL  Sleep:  Fair    Screenings: PHQ2-9   Flowsheet Row Office Visit from 01/14/2021 in [facility] Office Visit from 01/03/2021 in [facility]  PHQ-2 Total Score 3 1  PHQ-9 Total Score 10 --    Flowsheet Row ED from 12/16/2020 in [facility] EMERGENCY DEPARTMENT  C-SSRS RISK CATEGORY No Risk      Assessment and Plan:    Oneta Rack, NP 4/6/20222:46 PM

## 2021-03-02 NOTE — Progress Notes (Unsigned)
Patient appointment has been reschedule to 4/8 at 9:00-language line is unavailable for this new patient appointment.  NP will follow up with case management and language resources for in person interpreter.  Patient prefers female interpreter. Pshto or dari is preferred language

## 2021-03-04 ENCOUNTER — Other Ambulatory Visit: Payer: Self-pay

## 2021-03-04 ENCOUNTER — Ambulatory Visit (INDEPENDENT_AMBULATORY_CARE_PROVIDER_SITE_OTHER): Payer: Medicaid Other | Admitting: Family

## 2021-03-04 DIAGNOSIS — F320 Major depressive disorder, single episode, mild: Secondary | ICD-10-CM | POA: Diagnosis not present

## 2021-03-04 MED ORDER — AMITRIPTYLINE HCL 50 MG PO TABS: 50.0000 mg | ORAL_TABLET | Freq: Every day | ORAL | 0 refills | Status: DC

## 2021-03-04 NOTE — Progress Notes (Signed)
Psychiatric Initial Adult Assessment   Patient Identification: Bethany Harris MRN:  [id number] Date of Evaluation:  03/04/2021 Referral Source: Chief Complaint:   Visit Diagnosis: No diagnosis found.  History of Present Illness:  Bethany Harris is a 20 year [place] female who presents with worsening depression and anxiety reporting multiple stressors. Patient is accompanied by a refugee case Production designer, theatre/television/film.  Translation was provided by language line ( multiple attempts due to poor connection.  Bethany Harris states her mother passed away 4 months prior due to stroke " ruptured aneurysm." stated her mother passed at the age of 20 year old. Stated that her Maternal grandfather recently passed 3 days ago. Per case coordinator patient and her family relocated from [place] 4 months prior.  States family has been in [place] for the past 2 months.  Patient is 1 of 6 siblings.  She reports she was arranged to be married however her father decided to stop the the wedding arrangement. She stated that her father "  He has to remarry first."  She reports she is not allowed to attend school since she has been in the [place] and she has to care for her younger siblings.    Takako stated that " I have been so stressed and overwhelmed  that I have had " falling spells."  Chart review patient is  currently followed by neurology due to syncopal episode.  She was initiated on amitriptyline 50mg  as she reports this medication was prescribed for headaches and  insomnia and headaches.  She denies suicidal or homicidal ideations.  Denies auditory or visual hallucinations.    Does report a family history of mental illness however is not able to elaborate what family members.denied previous inpatient admission. Patient reported poor concentration, depression and anxiety. PHQ9 =15 and GAD7= 11.  Denied history of substance abuse/illicit drug use.  Denied history of self injures behaviors.   Associated Signs/Symptoms: Depression  Symptoms:  depressed mood, difficulty concentrating, anxiety, (Hypo) Manic Symptoms:  Distractibility, Anxiety Symptoms:  Excessive Worry, Psychotic Symptoms:  Hallucinations: None PTSD Symptoms: NA  Past Psychiatric History:  Previous Psychotropic Medications: No   Substance Abuse History in the last 12 months:  No.  Consequences of Substance Abuse: NA  Past Medical History:  Past Medical History:  Diagnosis Date  . GERD (gastroesophageal reflux disease)    No past surgical history on file.  Family Psychiatric History:  Family History: No family history on file.  Social History:   Social History   Socioeconomic History  . Marital status: Single    Spouse name: Not on file  . Number of children: Not on file  . Years of education: Not on file  . Highest education level: Not on file  Occupational History  . Occupation:  Tobacco Use  . Smoking status: Never Smoker  . Smokeless tobacco: Never Used  Vaping Use  . Vaping Use: Never used  Substance and Sexual Activity  . Alcohol use: Never  . Drug use: Never  . Sexual activity: Not on file  Other Topics Concern  . Not on file  Social History Narrative   Lives with dad and 6 other siblings   Right handed   Drinks caffeine rarely   Social Determinants of Health   Financial Resource Strain: Not on file  Food Insecurity: Not on file  Transportation Needs: Not on file  Physical Activity: Not on file  Stress: Not on file  Social Connections: Not on file    Additional Social History:  Allergies:  No Known Allergies  Metabolic Disorder Labs: No results found for: HGBA1C, MPG No results found for: PROLACTIN No results found for: CHOL, TRIG, HDL, CHOLHDL, VLDL, LDLCALC Lab Results  Component Value Date   TSH 1.140 01/03/2021    Therapeutic Level Labs: No results found for: LITHIUM No results found for: CBMZ No results found for: VALPROATE  Current Medications: Current Outpatient Medications   Medication Sig Dispense Refill  . amitriptyline (ELAVIL) 25 MG tablet Take 2 tablets (50 mg total) by mouth at bedtime. 60 tablet 2  . miconazole (MICOTIN) 2 % cream Apply 1 application topically 2 (two) times daily. 28.35 g 0  . triamcinolone ointment (KENALOG) 0.5 % Apply 1 application topically 2 (two) times daily. 30 g 0   No current facility-administered medications for this visit.    Musculoskeletal: Strength & Muscle Tone: within normal limits Gait & Station: normal Patient leans: N/A  Psychiatric Specialty Exam: Review of Systems  Psychiatric/Behavioral: Positive for sleep disturbance. Negative for hallucinations and suicidal ideas.  All other systems reviewed and are negative.   Last menstrual period 02/03/2021.There is no height or weight on file to calculate BMI.  General Appearance: Casual  Eye Contact:  Good  Speech:  Clear and Coherent  Volume:  Normal  Mood:  Anxious and Depressed  Affect:  Congruent  Thought Process:  Coherent  Orientation:  Full (Time, Place, and Person)  Thought Content:  Logical  Suicidal Thoughts:  No  Homicidal Thoughts:  No  Memory:  Immediate;   Fair Recent;   Fair  Judgement:  Fair  Insight:  Fair  Psychomotor Activity:  Normal  Concentration:  Concentration: Fair  Recall:  [employer] of Knowledge:Fair  Language: Good  Akathisia:  No  Handed:  Right  AIMS (if indicated):   Assets:  Communication Skills Desire for Improvement Resilience Social Support  ADL's:  Intact  Cognition: WNL  Sleep:  Fair   Screenings: PHQ2-9   Flowsheet Row Office Visit from 01/14/2021 in [facility] Office Visit from 01/03/2021 in [facility]  PHQ-2 Total Score 3 1  PHQ-9 Total Score 10 --    Flowsheet Row ED from 12/16/2020 in [facility] EMERGENCY DEPARTMENT  C-SSRS RISK CATEGORY No Risk      Assessment and Plan:  Major Depression   Continue amitriptyline 50 mg  table Consider initiating  individual therapy   Patient is requesting female clinician only please.  Follow-up in 4 weeks    Oneta Rack, NP 4/8/20229:25 AM

## 2021-03-05 ENCOUNTER — Encounter (HOSPITAL_COMMUNITY): Payer: Self-pay | Admitting: Family

## 2021-03-07 ENCOUNTER — Other Ambulatory Visit: Payer: Self-pay | Admitting: Nurse Practitioner

## 2021-03-07 NOTE — Progress Notes (Signed)
Kindly inform the patient that EEG study was normal

## 2021-03-08 ENCOUNTER — Encounter: Payer: Self-pay | Admitting: *Deleted

## 2021-03-18 ENCOUNTER — Telehealth: Payer: Self-pay | Admitting: Neurology

## 2021-03-18 NOTE — Telephone Encounter (Signed)
Patient is to see me for second opinion as requested by Dr. Pearlean Brownie. I am happy to weigh in. If further FU is needed, will schedule with Dr. Pearlean Brownie and/or Shanda Bumps, NP.

## 2021-03-21 ENCOUNTER — Ambulatory Visit (INDEPENDENT_AMBULATORY_CARE_PROVIDER_SITE_OTHER): Payer: Medicaid Other | Admitting: Neurology

## 2021-03-21 ENCOUNTER — Encounter: Payer: Self-pay | Admitting: Neurology

## 2021-03-21 VITALS — BP 112/73 | HR 101 | Ht 63.0 in | Wt 124.8 lb

## 2021-03-21 DIAGNOSIS — R55 Syncope and collapse: Secondary | ICD-10-CM | POA: Diagnosis not present

## 2021-03-21 DIAGNOSIS — G44209 Tension-type headache, unspecified, not intractable: Secondary | ICD-10-CM

## 2021-03-21 NOTE — Progress Notes (Signed)
Subjective:    Patient ID: Bethany Harris is a 20 y.o. female.  HPI     Interim history:   Dear Bethany Harris,   I saw your patient, Bethany Harris, upon your kind request in my clinic today for second opinion of her headaches and passing out spells, review of recent test results.  The patient is accompanied by her case worker, Bethany Harris, and Language interpreter, Bethany Harris, today.  As you know, Bethany Harris is a 21 year old right-handed female with an underlying medical history of reflux disease, otherwise benign history, who reports recurrent headaches and recurrent episodes of loss of consciousness briefly.  I have reviewed your office note from 01/18/2021.  She was found to have tension headaches.  She reports feeling better, no recent passing out spells, no severe headaches. She does does report ongoing stress and anxiety.  She has not talked to her primary care yet about this.  He adds that she was not comfortable talking in front of her father about her stress and anxiety due to not stressing out any further.  She lives with her father and 6 younger siblings.  She has lost her mother due to a stroke recently.  She does feel a great sense of responsibility to take care of her family.  She does not sleep very well but has slept a little better since using amitriptyline.  She has not had any sudden onset of one-sided weakness, no involuntary twitching.  She is currently fasting.  She does report some appetite loss even when she is able to eat.  Work-up thus far has included a brain MRI with and without contrast as well as MR angiogram of the head and neck.  I reviewed the imaging results.  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  The extent of stenosis appears higher on the reconstructed views than the source images.   MR  angiogram of the neck with and without contrast from 02/10/2021 showed:  IMPRESSION: This is a normal MR angiogram of the neck arteries with and without contrast.   MRI brain with and without contrast from 02/10/2021 showed:    IMPRESSION: This is a normal MRI of the brain with and without contrast.  She also had CT angiogram of the head and neck previously on 12/17/2020 and I reviewed the results: IMPRESSION: CT head:   No evidence of acute intracranial abnormality.   CTA neck:   The common carotid, internal carotid and vertebral arteries are patent within the neck without stenosis.   CTA head:   Unremarkable exam. No intracranial large vessel occlusion or proximal high-grade arterial stenosis.  She had an EEG in this office on 03/01/2021 and I reviewed the results:  Summary: Normal electroencephalogram, awake, asleep and with activation procedures. There are no focal lateralizing or epileptiform features.        Her Past Medical History Is Significant For: Past Medical History:  Diagnosis Date  . GERD (gastroesophageal reflux disease)     Her Past Surgical History Is Significant For: No past surgical history on file.  Her Family History Is Significant For: No family history on file.  Her Social History Is Significant For: Social History   Socioeconomic History  . Marital status: Single    Spouse name: Not on file  . Number of children: Not on file  . Years of  education: Not on file  . Highest education level: Not on file  Occupational History  . Occupation: Consulting civil engineer  Tobacco Use  . Smoking status: Never Smoker  . Smokeless tobacco: Never Used  Vaping Use  . Vaping Use: Never used  Substance and Sexual Activity  . Alcohol use: Never  . Drug use: Never  . Sexual activity: Not on file  Other Topics Concern  . Not on file  Social History Narrative   Lives with dad and 6 other siblings   Right handed   Drinks caffeine rarely   Social Determinants of Health    Financial Resource Strain: Not on file  Food Insecurity: Not on file  Transportation Needs: Not on file  Physical Activity: Not on file  Stress: Not on file  Social Connections: Not on file    Her Allergies Are:  No Known Allergies:   Her Current Medications Are:  Outpatient Encounter Medications as of 03/21/2021  Medication Sig  . amitriptyline (ELAVIL) 50 MG tablet Take 1 tablet (50 mg total) by mouth at bedtime.  . miconazole (MICOTIN) 2 % cream Apply 1 application topically 2 (two) times daily. (Patient not taking: Reported on 03/21/2021)  . triamcinolone ointment (KENALOG) 0.5 % Apply 1 application topically 2 (two) times daily. (Patient not taking: Reported on 03/21/2021)   No facility-administered encounter medications on file as of 03/21/2021.  :  Review of Systems:  Out of a complete 14 point review of systems, all are reviewed and negative with the exception of these symptoms as listed below:  Review of Systems  Neurological:       Here for consult at the request for Dr. Pearlean Brownie. Pt reports she has been doing ok since her last visit.Reports sx are the same. She does report a rash that is troublesome to her, following with Bethany Harris for this issue.     Objective:  Neurological Exam  Physical Exam Physical Examination:   Vitals:   03/21/21 1420  BP: 112/73  Pulse: (!) 101    General Examination: The patient is a very pleasant 20 y.o. female in no acute distress. She appears well-developed and well-nourished and well groomed.   HEENT: Normocephalic, atraumatic, pupils are equal, round and reactive to light and accommodation.  Extraocular tracking is well-preserved, hearing grossly intact.  Face is symmetric with normal facial animation, speech is clear without dysarthria, voice tremor or hypophonia.  Airway examination reveals mild mouth dryness.  Tongue protrudes centrally and palate elevates symmetrically.  No carotid bruits.    Chest: Clear to auscultation  without wheezing, rhonchi or crackles noted.  Heart: S1+S2+0, regular and normal without murmurs, rubs or gallops noted.   Abdomen: Soft, non-tender and non-distended with normal bowel sounds appreciated on auscultation.  Extremities: There is no pitting edema in the distal lower extremities bilaterally. Pedal pulses are intact.  Skin: Warm and dry without trophic changes noted.  Musculoskeletal: exam reveals no obvious joint deformities, tenderness or joint swelling or erythema.   Neurologically:  Mental status: The patient is awake, alert and oriented in all 4 spheres. Her immediate and remote memory, attention, language skills and fund of knowledge are appropriate. There is no evidence of aphasia, agnosia, apraxia or anomia. Speech is clear with normal prosody and enunciation. Thought process is linear. Mood is normal and affect is normal.  Cranial nerves II - XII are as described above under HEENT exam. In addition: shoulder shrug is normal with equal shoulder height noted. Motor exam: Normal bulk, strength  and tone is noted. There is no drift, tremor or rebound. Romberg is negative. Reflexes are 2+ throughout. Babinski: Toes are flexor bilaterally. Fine motor skills and coordination: intact with normal finger taps, normal hand movements, normal rapid alternating patting, normal foot taps and normal foot agility.  Cerebellar testing: No dysmetria or intention tremor on finger to nose testing. Heel to shin is unremarkable bilaterally. There is no truncal or gait ataxia.  Sensory exam: intact to light touch in the upper and lower extremities.  Gait, station and balance: She stands easily. No veering to one side is noted. No leaning to one side is noted. Posture is age-appropriate and stance is narrow based. Gait shows normal stride length and normal pace. No problems turning are noted. Tandem walk is unremarkable.   Assessment and Plan:   In summary, Anabela Crayton is a very pleasant 20  y.o.-year old female with an underlying medical history of reflux disease, otherwise benign history, who presents for second opinion of her headaches and passing out spells.  She had a recent set of scans including brain MRI and MR angiograms.  EEG was normal as well.  She is largely reassured today.  She sleeps a little better.  She has not had any recent syncopal spells.  She is advised to try to stay well-hydrated and well-nourished, especially in between fasting.  She has a nonfocal exam and is reassured in that regard as well.  She is advised to follow-up with you routinely in 6 months, she can also see the nurse practitioner, but she would like to see you if possible. She is advised to talk to her primary care nurse practitioner about anxiety and stress, which have been ongoing.  She is encouraged to talk to her about routine blood work as well.  I think she would benefit from checking her thyroid function, vitamin D and vitamin B12 at the next appointment.  She was given written instructions and verbal instructions through the interpreter which was very helpful.  She is advised that for now we do not have to change anything or add any new tests and that she can follow-up with you routinely.  She is encouraged to call with any interim concerns or questions and if we need to move up the appointment, we can review this at the time.   Thank you very much for allowing me to weigh in. If I can be of any further assistance to you please do not hesitate to talk to me.  Sincerely,   Huston Foley, MD, PhD

## 2021-03-21 NOTE — Patient Instructions (Addendum)
It was nice to meet you today.  All your tests look good, you have a normal exam as well.  Aside from fasting, please try to eat well and hydrate well.  Have your primary care nurse practitioner, Thad Ranger check your vitamin levels including vitamin B12, thyroid function and vitamin D.  For anxiety, please follow-up with your primary care, sometimes a small dose of an antidepressant can take the edge off. Follow-up routinely to see Dr. Pearlean Brownie or his nurse practitioner Shanda Bumps in 6 months.

## 2021-04-01 ENCOUNTER — Encounter: Payer: Self-pay | Admitting: Nurse Practitioner

## 2021-04-01 ENCOUNTER — Other Ambulatory Visit: Payer: Self-pay

## 2021-04-01 ENCOUNTER — Ambulatory Visit (INDEPENDENT_AMBULATORY_CARE_PROVIDER_SITE_OTHER): Payer: Medicaid Other | Admitting: Nurse Practitioner

## 2021-04-01 VITALS — BP 107/67 | HR 92 | Temp 97.4°F | Ht 63.0 in | Wt 127.0 lb

## 2021-04-01 DIAGNOSIS — R21 Rash and other nonspecific skin eruption: Secondary | ICD-10-CM | POA: Diagnosis not present

## 2021-04-01 DIAGNOSIS — N926 Irregular menstruation, unspecified: Secondary | ICD-10-CM | POA: Diagnosis not present

## 2021-04-01 MED ORDER — TRIAMCINOLONE ACETONIDE 0.5 % EX OINT: 1.0000 "application " | TOPICAL_OINTMENT | Freq: Two times a day (BID) | CUTANEOUS | 3 refills | Status: DC

## 2021-04-01 MED ORDER — NORGESTIM-ETH ESTRAD TRIPHASIC 0.18/0.215/0.25 MG-25 MCG PO TABS: 1.0000 | ORAL_TABLET | Freq: Every day | ORAL | 11 refills | Status: DC

## 2021-04-01 NOTE — Progress Notes (Signed)
[facility] Patient [facility] [address] [place], [place]  [address] Phone:  [phone]   Fax:  [phone]   Established Patient Office Visit  Subjective:  Patient ID: Bethany Harris, female    DOB: 2001/01/08  Age: 20 y.o. MRN: [id number]  CC:  Chief Complaint  Patient presents with  . Follow-up    3 month follow up , rash has  gotten better since last visit     HPI Bethany Harris presents for follow up. She  has a past medical history of GERD (gastroesophageal reflux disease).   Rash She is on medications as noted and overall her rash has slightly improved. Her rash affects upper body. She has been experiencing itching and prickling. Overall, symptoms have been mild. She denies headache, dizziness, visual changes, shortness of breath, dyspnea on exertion, chest pain, nausea, vomiting or any edema.   Irregular Menstruation Patient complains of irregular menses. Patient's last menstrual period was 03/30/2021. Periods are regular every 28-30 days, lasting 10 days. Dysmenorrhea:none. Cyclic symptoms include: none. Current contraception: none. History of infertility: no. History of abnormal Pap smear: no. She reports increased stress with school and domestic issues.   Past Medical History:  Diagnosis Date  . GERD (gastroesophageal reflux disease)     History reviewed. No pertinent surgical history.  History reviewed. No pertinent family history.  Social History   Socioeconomic History  . Marital status: Single    Spouse name: Not on file  . Number of children: Not on file  . Years of education: Not on file  . Highest education level: Not on file  Occupational History  . Occupation: Consulting civil engineer  Tobacco Use  . Smoking status: Never Smoker  . Smokeless tobacco: Never Used  Vaping Use  . Vaping Use: Never used  Substance and Sexual Activity  . Alcohol use: Never  . Drug use: Never  . Sexual activity: Not on file  Other Topics Concern  . Not on file  Social History  Narrative   Lives with dad and 6 other siblings   Right handed   Drinks caffeine rarely   Social Determinants of Health   Financial Resource Strain: Not on file  Food Insecurity: Not on file  Transportation Needs: Not on file  Physical Activity: Not on file  Stress: Not on file  Social Connections: Not on file  Intimate Partner Violence: Not on file    Outpatient Medications Prior to Visit  Medication Sig Dispense Refill  . amitriptyline (ELAVIL) 50 MG tablet Take 1 tablet (50 mg total) by mouth at bedtime. 60 tablet 0  . miconazole (MICOTIN) 2 % cream Apply 1 application topically 2 (two) times daily. (Patient not taking: No sig reported) 28.35 g 0  . triamcinolone ointment (KENALOG) 0.5 % Apply 1 application topically 2 (two) times daily. (Patient not taking: Reported on 04/01/2021) 30 g 0   No facility-administered medications prior to visit.    No Known Allergies  ROS Review of Systems    Objective:    Physical Exam Constitutional:      General: She is not in acute distress.    Appearance: She is not ill-appearing, toxic-appearing or diaphoretic.  HENT:     Head: Normocephalic and atraumatic.     Nose: Nose normal.     Mouth/Throat:     Mouth: Mucous membranes are moist.  Cardiovascular:     Rate and Rhythm: Normal rate.     Pulses: Normal pulses.     Heart sounds:  Normal heart sounds.  Pulmonary:     Effort: Pulmonary effort is normal.  Abdominal:     Palpations: Abdomen is soft.  Musculoskeletal:        General: Normal range of motion.     Cervical back: Normal range of motion.  Skin:    General: Skin is warm and dry.     Findings: Rash (acne type rash to upper chest and back. ) present.  Neurological:     General: No focal deficit present.     Mental Status: She is alert and oriented to person, place, and time.  Psychiatric:        Mood and Affect: Mood normal.        Behavior: Behavior normal.        Thought Content: Thought content normal.         Judgment: Judgment normal.     BP 107/67 (BP Location: Left Arm, Patient Position: Sitting, Cuff Size: Normal)   Pulse 92   Temp (!) 97.4 F (36.3 C) (Temporal)   Ht 5\' 3"  (1.6 m)   Wt 127 lb (57.6 kg)   LMP 03/30/2021   SpO2 98%   BMI 22.50 kg/m  Wt Readings from Last 3 Encounters:  04/01/21 127 lb (57.6 kg) (49 %, Z= -0.03)*  03/21/21 124 lb 12.5 oz (56.6 kg) (45 %, Z= -0.13)*  02/11/21 130 lb (59 kg) (55 %, Z= 0.13)*   * Growth percentiles are based on CDC (Girls, 2-20 Years) data.     Health Maintenance Due  Topic Date Due  . HPV VACCINES (1 - 2-dose series) Never done       Topic Date Due  . HPV VACCINES (1 - 2-dose series) Never done    Lab Results  Component Value Date   TSH 1.140 01/03/2021   Lab Results  Component Value Date   WBC 9.9 12/16/2020   HGB 13.3 12/16/2020   HCT 40.1 12/16/2020   MCV 87.7 12/16/2020   PLT 195 12/16/2020   Lab Results  Component Value Date   NA 141 12/16/2020   K 4.2 12/16/2020   CO2 25 12/16/2020   GLUCOSE 88 12/16/2020   BUN 12 12/16/2020   CREATININE 0.62 12/16/2020   CALCIUM 9.5 12/16/2020   ANIONGAP 10 12/16/2020   No results found for: CHOL No results found for: HDL No results found for: LDLCALC No results found for: TRIG No results found for: CHOLHDL No results found for: 12/18/2020    Assessment & Plan:   Problem List Items Addressed This Visit   None   Visit Diagnoses    Rash and nonspecific skin eruption    -  Primary Persistent Tinea rash has improved Acne type rash remains Refill on triamcinolone cream Referral to dermatology   Irregular menstrual cycle     Low dose hormonal contraceptive maybe effective in treatment of irregular cycle  Education provided Patient to call with follow up apt due to time constraints.    Relevant Orders   Ambulatory referral to Dermatology      Meds ordered this encounter  Medications  . triamcinolone ointment (KENALOG) 0.5 %    Sig: Apply 1 application  topically 2 (two) times daily.    Dispense:  180 g    Refill:  3    Order Specific Question:   Supervising Provider    Answer:   [id number] Quentin Angst  . Norgestimate-Ethinyl Estradiol Triphasic (ORTHO TRI-CYCLEN LO) 0.18/0.215/0.25 MG-25 MCG tab    Sig:  Take 1 tablet by mouth daily.    Dispense:  28 tablet    Refill:  11    Order Specific Question:   Supervising Provider    Answer:   Quentin Angst [[id number]]    Follow-up: Return in about 6 months (around 10/02/2021).    Barbette Merino, NP

## 2021-04-01 NOTE — Patient Instructions (Addendum)
Acne  Acne is a skin problem that causes small, red bumps (pimples) and other skin changes. The skin has tiny holes called pores. Each pore has an oil gland. Acne happens when the pores get blocked. The pores may become red, sore, and swollen. They may also become infected. Acne is common among teenagers. Acne usually goes away over time. What are the causes? This condition may be caused when:  Oil glands get blocked by oil, dead skin cells, and dirt.  Bacteria that live in the oil glands increase in number and cause infection. Acne can start with changes in hormones. These changes can occur:  When children mature into their teens (adolescence).  When women get their period (menstrual cycle).  When women are pregnant. Some things can make acne worse. They include:  Cosmetics and hair products that have oil in them.  Stress.  Diseases that cause changes in hormones.  Some medicines.  Headbands, backpacks, or shoulder pads.  Being near certain oils and chemicals.  Foods that are high in sugars. These include dairy products, sweets, and chocolates. What increases the risk? You are more likely to develop this condition if:  You are a teenager.  You have a family history of acne. What are the signs or symptoms? Symptoms of this condition include:  Small, red bumps (pimples or papules).  Whiteheads.  Blackheads.  Small, pus-filled pimples (pustules).  Big, red pimples or pustules that feel tender. Acne that is very bad can cause:  An abscess. This is an area that has pus.  Cysts. These are hard, painful sacs that have fluid.  Scars. These can happen after large pimples heal. How is this treated? Treatment for this condition depends on how bad your acne is. It may include:  Creams and lotions. These can: ? Keep the pores of your skin open. ? Prevent infections and swelling.  Medicines that treat infections (antibiotics). These can be put on your skin or taken  as pills.  Pills that decrease the amount of oil in your skin.  Birth control pills.  Light or laser treatments.  Shots of medicine into the areas with acne.  Chemicals that cause the skin to peel.  Surgery. Follow these instructions at home: Good skin care is the most important thing you can do to treat your acne. Take care of your skin as told by your doctor. You may be told to do these things:  Wash your skin gently at least two times each day. You should also wash your skin: ? After you exercise. ? Before you go to bed.  Use mild soap.  Use a water-based skin moisturizer after you wash your skin.  Use a sunscreen or sunblock with SPF 30 or greater. This is very important if you are using acne medicines.  Choose cosmetics that will not block your oil glands (are noncomedogenic). Medicines  Take over-the-counter and prescription medicines only as told by your doctor.  If you were prescribed an antibiotic medicine, use it or take it as told by your doctor. Do not stop using the antibiotic even if your acne gets better. General instructions  Keep your hair clean and off your face. Shampoo your hair on a regular basis. If you have oily hair, you may need to wash it every day.  Avoid wearing tight headbands or hats.  Avoid picking or squeezing your pimples. That can make your acne worse and cause it to scar.  Shave gently. Only shave when you have to.    Keep a food journal. This can help you see if any foods are linked to your acne.  Keep all follow-up visits as told by your doctor. This is important. Contact a doctor if:  Your acne is not better after eight weeks.  Your acne gets worse.  You have a large area of skin that is red or tender.  You think that you are having side effects from any acne medicine. Summary  Acne is a skin problem that causes pimples. Acne is common among teenagers. Acne usually goes away over time.  Acne starts with changes in your  hormones. Other causes include stress, diet, and some medicines.  Follow your doctor's instructions on how to take care of your skin. Good skin care is the most important thing you can do to treat your acne.  Take over-the-counter and prescription medicines only as told by your doctor.  Contact your doctor if you think that you are having side effects from any acne medicine. This information is not intended to replace advice given to you by your health care provider. Make sure you discuss any questions you have with your health care provider. Document Revised: 03/26/2018 Document Reviewed: 03/26/2018   Oral Contraception Information Oral contraceptive pills (OCPs) are medicines taken by mouth to prevent pregnancy. They work by:  Preventing the ovaries from releasing eggs.  Thickening mucus in the lower part of the uterus (cervix). This prevents sperm from entering the uterus.  Thinning the lining of the uterus (endometrium). This prevents a fertilized egg from attaching to the endometrium. OCPs are highly effective when taken exactly as prescribed. However, OCPs do not prevent STIs (sexually transmitted infections). Using condoms while on an OCP can help prevent STIs. What happens before starting OCPs? Before you start taking OCPs:  You may have a physical exam, blood test, and Pap test.  Your health care provider will make sure you are a good candidate for oral contraception. OCPs are not a good option for certain women, such as: ? Women who smoke and are older than age 36. ? Women who have or have had certain conditions, such as:  A history of high blood pressure.  Deep vein thrombosis.  Pulmonary embolism.  Stroke.  Cardiovascular disease.  Peripheral vascular disease. Ask your health care provider about the possible side effects of the OCP you may be prescribed. Be aware that it can take 2-3 months for your body to adjust to changes in hormone levels. Types of oral  contraception Birth control pills contain the hormones estrogen and progestin (synthetic progesterone) or progestin only. The combination pill This type of pill contains estrogen and progestin hormones.  Conventional contraception pills come in packs of 21 or 28 pills. ? Some packs with 28-day pills contain estrogen and progestin for the first 21-24 days. Hormone-free tablets, called placebos, are taken for the final 4-7 days. You should have menstrual bleeding during the time you take the placebos. ? In packs with 21 tablets, you take no pills for 7 days. Menstrual bleeding occurs during these days. (Some people prefer taking a pill for 28 days to help establish a routine).  Extended-interval contraception pills come in packs of 91 pills. The first 84 tablets have both estrogen and progestin. The last 7 pills are placebos. Menstrual bleeding occurs during the placebo days. With this schedule, menstrual bleeding happens once every 3 months.  Continuous contraception pills come in packs of 28 pills. All pills in the pack contain estrogen and progestin. With this  schedule, regular menstrual bleeding does not happen, but there may be spotting or irregular bleeding. Progestin-only pills This type of pill is often called the mini-pill and contains the progestin hormone only. It comes in packs of 28 pills. In some packs, the last 4 pills are placebos. The pill must be taken at the same time every day. This is very important to prevent pregnancy. Menstrual bleeding may not be regular or predictable.   What are the advantages? Oral contraception provides reliable and continuous contraception if taken as directed. It may treat or decrease symptoms of:  Menstrual period cramps.  Irregular menstrual cycle or bleeding.  Heavy menstrual flow.  Abnormal uterine bleeding.  Acne, depending on the type of pill.  Polycystic ovarian syndrome (POS).  Endometriosis.  Iron deficiency anemia.  Premenstrual  symptoms, including severe irritability, depression, or anxiety. It also may:  Reduce the risk of endometrial and ovarian cancer.  Be used as emergency contraception.  Prevent ectopic pregnancies and infections of the fallopian tubes. What can make OCPs less effective? OCPs may be less effective if:  You forget to take the pill every day. For progestin-only pills, it is especially important to take the pill at the same time each day. Even taking it 3 hours late can increase the risk of pregnancy.  You have a stomach or intestinal disease that reduces your body's ability to absorb the pill.  You take OCPs with other medicines that make OCPs less effective, such as antibiotics, certain HIV medicines, and some seizure medicines.  You take expired OCPs.  You forget to restart the pill after 7 days of not taking it. This refers to the packs of 21 pills. What are the side effects and risks? OCPs can sometimes cause side effects, such as:  Headache.  Depression.  Trouble sleeping.  Nausea and vomiting.  Breast tenderness.  Irregular bleeding or spotting during the first several months.  Bloating or fluid retention.  Increase in blood pressure. Combination pills may slightly increase the risk of:  Blood clots.  Heart attack.  Stroke. Follow these instructions at home: Follow instructions from your health care provider about how to start taking your first cycle of OCPs. Depending on when you start the pill, you may need to use a backup form of birth control, such as condoms, during the first week. Make sure you know what steps to take if you forget to take the pill. Summary  Oral contraceptive pills (OCPs) are medicines taken by mouth to prevent pregnancy. They are highly effective when taken exactly as prescribed.  OCPs contain a combination of the hormones estrogen and progestin (synthetic progesterone) or progestin only.  Before you start taking the pill, you may have a  physical exam, blood test, and Pap test. Your health care provider will make sure you are a good candidate for oral contraception.  The combination pill may come in a 21-day pack, a 28-day pack, or a 91-day pack. Progestin-only pills come in packs of 28 pills.  OCPs can sometimes cause side effects, such as headache, nausea, breast tenderness, or irregular bleeding. This information is not intended to replace advice given to you by your health care provider. Make sure you discuss any questions you have with your health care provider. Document Revised: 08/13/2020 Document Reviewed: 07/22/2020 Elsevier Patient Education  2021 [employer].     Elsevier Patient Education  2021 [employer].

## 2021-04-13 ENCOUNTER — Other Ambulatory Visit: Payer: Self-pay

## 2021-04-13 ENCOUNTER — Ambulatory Visit (INDEPENDENT_AMBULATORY_CARE_PROVIDER_SITE_OTHER): Payer: Medicaid Other | Admitting: Psychiatry

## 2021-04-13 ENCOUNTER — Encounter (HOSPITAL_COMMUNITY): Payer: Self-pay | Admitting: Psychiatry

## 2021-04-13 VITALS — BP 111/68 | HR 98 | Ht 63.0 in | Wt 123.0 lb

## 2021-04-13 DIAGNOSIS — F320 Major depressive disorder, single episode, mild: Secondary | ICD-10-CM

## 2021-04-13 DIAGNOSIS — F32A Depression, unspecified: Secondary | ICD-10-CM | POA: Insufficient documentation

## 2021-04-13 DIAGNOSIS — F411 Generalized anxiety disorder: Secondary | ICD-10-CM

## 2021-04-13 HISTORY — DX: Depression, unspecified: F32.A

## 2021-04-13 MED ORDER — AMITRIPTYLINE HCL 50 MG PO TABS
50.0000 mg | ORAL_TABLET | Freq: Every day | ORAL | 2 refills | Status: DC
Start: 2021-04-13 — End: 2021-07-14

## 2021-04-13 NOTE — Progress Notes (Signed)
BH MD/PA/NP OP Progress Note  04/13/2021 4:02 PM Bethany Harris  MRN:  [id number]  Chief Complaint:  Chief Complaint    Medication Management    " When I first took amitriptyline it was green but this new one is red"  HPI: 20 year old female seen today for follow-up psychiatric evaluation.  She has a psychiatric history of anxiety and depression.  She is currently managed on amitriptyline 50 mg nightly.  She notes that she discontinued it 4 days ago because it caused her to have increased sedation and dizziness.  Today she is well-groomed, pleasant, cooperative, and engaged in conversation.  Provider utilized an interpreter as patient's speaks Dari.  Today she informed provider that she discontinued amitriptyline 4 days ago.  She notes that it increased her sedation and cause dizziness.  She informed provider that when she was first prescribed by her neurologist the pill was green and when she received it after being seen at [facility] the pill was red.  Provider informed patient that the generic form of the pill may be different than the brand name.  Provider showed patient a picture of the generic pill versus the brand name pill and she notes that she liked the generic pill better which she reports was green.  Patient informed provider that she is anxious most days.  She notes that she worries about her relationship with the man she fell in love with and her father.  She notes that her father has lost confidence/trust in her because she was untruthful about the person she is dating.  She notes that she would like to marry this person however needs her father's approval.  She notes that she fears for the wellbeing of her father and siblings after she gets married.  She notes that the person that she loves currently lives in [place] and may be moving to [place] soon.  Provider conducted a GAD-7 and patient scored a 19.  Provider also conducted a PHQ-9 and patient scored a 14.  Patient notes that she has  a poor appetite.  She notes that she was fasting for Ramadan however notes that after the holiday she still had a poor appetite.  She informed provider that she lost 4 kg.  Patient informed provider that she is studying [place].  She notes that when she finishes her study she plans to learn how to drive.  Patient notes that she has intense headaches when she thinks about the above.  She notes that she was prescribed amitriptyline in the past that help with these headaches as well as her mental health conditions and notes that she would like to restart the generic version.  Today she is agreeable to restarting amitriptyline (generic version) 50 mg to help manage anxiety, depression, and headaches.  She was referred to outpatient counseling for therapy.  Patient request to have a female interpreter at next visit.  Patient's concerns noted and administrative staff notified.  No other concerns noted at this time.   Visit Diagnosis:    ICD-10-CM   1. GAD (generalized anxiety disorder)  F41.1 amitriptyline (ELAVIL) 50 MG tablet    Ambulatory referral to Social Work  2. Mild depression (HCC)  F32.0 amitriptyline (ELAVIL) 50 MG tablet    Ambulatory referral to Social Work    Past Psychiatric History: Depression and anxiety  Past Medical History:  Past Medical History:  Diagnosis Date  . GERD (gastroesophageal reflux disease)    No past surgical history on file.  Family Psychiatric History: Denies  Family History: No family history on file.  Social History:  Social History   Socioeconomic History  . Marital status: Single    Spouse name: Not on file  . Number of children: Not on file  . Years of education: Not on file  . Highest education level: Not on file  Occupational History  . Occupation: Consulting civil engineer  Tobacco Use  . Smoking status: Never Smoker  . Smokeless tobacco: Never Used  Vaping Use  . Vaping Use: Never used  Substance and Sexual Activity  . Alcohol use: Never  . Drug use:  Never  . Sexual activity: Not on file  Other Topics Concern  . Not on file  Social History Narrative   Lives with dad and 6 other siblings   Right handed   Drinks caffeine rarely   Social Determinants of Health   Financial Resource Strain: Not on file  Food Insecurity: Not on file  Transportation Needs: Not on file  Physical Activity: Not on file  Stress: Not on file  Social Connections: Not on file    Allergies: No Known Allergies  Metabolic Disorder Labs: No results found for: HGBA1C, MPG No results found for: PROLACTIN No results found for: CHOL, TRIG, HDL, CHOLHDL, VLDL, LDLCALC Lab Results  Component Value Date   TSH 1.140 01/03/2021    Therapeutic Level Labs: No results found for: LITHIUM No results found for: VALPROATE No components found for:  CBMZ  Current Medications: Current Outpatient Medications  Medication Sig Dispense Refill  . miconazole (MICOTIN) 2 % cream Apply 1 application topically 2 (two) times daily. 28.35 g 0  . Norgestimate-Ethinyl Estradiol Triphasic (ORTHO TRI-CYCLEN LO) 0.18/0.215/0.25 MG-25 MCG tab Take 1 tablet by mouth daily. 28 tablet 11  . triamcinolone ointment (KENALOG) 0.5 % Apply 1 application topically 2 (two) times daily. 180 g 3  . amitriptyline (ELAVIL) 50 MG tablet Take 1 tablet (50 mg total) by mouth at bedtime. 30 tablet 2   No current facility-administered medications for this visit.     Musculoskeletal: Strength & Muscle Tone: within normal limits Gait & Station: normal Patient leans: N/A  Psychiatric Specialty Exam: Review of Systems  Blood pressure 111/68, pulse 98, height 5\' 3"  (1.6 m), weight 123 lb (55.8 kg), last menstrual period 03/30/2021.Body mass index is 21.79 kg/m.  General Appearance: Well Groomed  Eye Contact:  Good  Speech:  Clear and Coherent and Normal Rate  Volume:  Normal  Mood:  Anxious and Depressed  Affect:  Appropriate and Congruent  Thought Process:  Coherent, Goal Directed and Linear   Orientation:  Full (Time, Place, and Person)  Thought Content: WDL and Logical   Suicidal Thoughts:  No  Homicidal Thoughts:  No  Memory:  Immediate;   Good Recent;   Good Remote;   Good  Judgement:  Good  Insight:  Good  Psychomotor Activity:  Normal  Concentration:  Concentration: Good and Attention Span: Good  Recall:  Good  Fund of Knowledge: Good  Language: Good  Akathisia:  No  Handed:  Right  AIMS (if indicated): Not done  Assets:  Communication Skills Desire for Improvement Financial Resources/Insurance Housing Social Support  ADL's:  Intact  Cognition: WNL  Sleep:  Good   Screenings: GAD-7   Flowsheet Row Office Visit from 04/13/2021 in [facility] Office Visit from 03/04/2021 in [facility]  Total GAD-7 Score 19 11    PHQ2-9   Flowsheet Row Office Visit from 04/13/2021 in [facility] Office Visit from 03/04/2021 in [facility] Office Visit from 01/14/2021 in [facility] Office Visit from 01/03/2021 in [facility]  PHQ-2 Total Score 4 4 3 1   PHQ-9 Total Score 14 15 10  --    Flowsheet Row Office Visit from 04/13/2021 in [facility] ED from 12/16/2020 in [facility] EMERGENCY DEPARTMENT  C-SSRS RISK CATEGORY No Risk No Risk       Assessment and Plan: Patient endorses symptoms of anxiety and depression related to life stressors.  She notes that she was given the brand name of amitriptyline which she notes is read and had side effects (oversedation and dizziness).  She notes when she was taking the generic version of the medication she did not have these side effects and is agreeable to restarting amitriptyline 50 mg nightly to help manage anxiety and depression.  She was referred to outpatient counseling for therapy.  1. GAD (generalized anxiety disorder)  Restart-  amitriptyline (ELAVIL) 50 MG tablet; Take 1 tablet (50 mg total) by mouth at bedtime.  Dispense: 30 tablet; Refill: 2 - Ambulatory referral to Social Work  2. Mild depression (HCC)  Restart- amitriptyline (ELAVIL) 50 MG tablet; Take 1 tablet (50 mg total) by mouth at bedtime.  Dispense: 30 tablet; Refill: 2 - Ambulatory referral to Social Work  Follow-up in 3 months Follow-up with therapy  12/18/2020, NP 04/13/2021, 4:02 PM

## 2021-04-15 ENCOUNTER — Ambulatory Visit (HOSPITAL_COMMUNITY): Payer: Medicaid Other | Admitting: Physician Assistant

## 2021-04-16 NOTE — Progress Notes (Signed)
Cardiology Office Note:    Date:  04/18/2021   ID:  Bethany Harris, DOB 01-Feb-2001, MRN [id number]  PCP:  Barbette Merino, NP   [facility] Providers Cardiologist:  None {   Referring MD: Barbette Merino, NP    History of Present Illness:    Bethany Harris is a 20 y.o. female with a hx of near syncope and family history of congenital heart disease who was initially seen by Dr. Izora Ribas now presenting to clinic for follow-up.  Last saw Dr. Izora Ribas on 01/18/21 where she was having episodes of near syncope over the past year. TTE obtained on 02/09/21 which showed LVEF 60-65%, normal strain significant valve disease.  Patient was interviewed with the assistance of a Dari interpretor.   Today, the patient states she continues to have episodes of dizziness. During these episodes, she develops severe HA, dizziness, nausea, feels like her is heart racing. Has not had syncope in 3months. Over the past month, she has had several episodes of dizziness and HA occurring about 2-3x/week. Symptoms can worsen with menses but can occur without being on her period. States that most of the time, her symptoms occur when she has been standing for a long period of time and improve with sitting down. Of note, she has had a MRI of her head  for work-up of her HA which was normal.   Past Medical History:  Diagnosis Date  . GERD (gastroesophageal reflux disease)     No past surgical history on file.  Current Medications: Current Meds  Medication Sig  . amitriptyline (ELAVIL) 50 MG tablet Take 1 tablet (50 mg total) by mouth at bedtime.  . miconazole (MICOTIN) 2 % cream Apply 1 application topically 2 (two) times daily.  . Norgestimate-Ethinyl Estradiol Triphasic (ORTHO TRI-CYCLEN LO) 0.18/0.215/0.25 MG-25 MCG tab Take 1 tablet by mouth daily.  Marland Kitchen triamcinolone ointment (KENALOG) 0.5 % Apply 1 application topically 2 (two) times daily.     Allergies:   Patient has no known allergies.   Social  History   Socioeconomic History  . Marital status: Single    Spouse name: Not on file  . Number of children: Not on file  . Years of education: Not on file  . Highest education level: Not on file  Occupational History  . Occupation: Consulting civil engineer  Tobacco Use  . Smoking status: Never Smoker  . Smokeless tobacco: Never Used  Vaping Use  . Vaping Use: Never used  Substance and Sexual Activity  . Alcohol use: Never  . Drug use: Never  . Sexual activity: Not on file  Other Topics Concern  . Not on file  Social History Narrative   Lives with dad and 6 other siblings   Right handed   Drinks caffeine rarely   Social Determinants of Health   Financial Resource Strain: Not on file  Food Insecurity: Not on file  Transportation Needs: Not on file  Physical Activity: Not on file  Stress: Not on file  Social Connections: Not on file     Family History: The patient's family history is not on file.  ROS:   Please see the history of present illness.    Review of Systems  Constitutional: Positive for malaise/fatigue.  HENT: Negative for hearing loss.   Respiratory: Negative for shortness of breath.   Cardiovascular: Positive for palpitations. Negative for chest pain, orthopnea, claudication, leg swelling and PND.  Gastrointestinal: Positive for nausea. Negative for blood in stool, melena and vomiting.  Genitourinary: Negative  for hematuria.  Musculoskeletal: Negative for myalgias.  Neurological: Positive for dizziness and loss of consciousness.  Endo/Heme/Allergies: Negative for polydipsia.  Psychiatric/Behavioral: Negative for substance abuse.    EKGs/Labs/Other Studies Reviewed:    The following studies were reviewed today: MRA head and neck 02/10/21: FINDINGS: This study is of adequate technical quality.  There is anterograde flow in the bilateral vertebral and carotid arteries on 2D-TOF views.    The flow signal of the left subclavian artery has no stenosis.  The left common,  internal and external carotid arteries have no stenosis.  The left vertebral artery has no stenosis from its origin up to the vertebrobasilar junction.    On the right, the brachiocephalic trunk and subclavian arteries have no stenosis. The right common, internal and external carotid arteries have no stenosis. The right vertebral artery has no stenosis from its origin to the vertebrobasilar junction.   Limited views of the intracranial vasculature are unremarkable.  MRI Head 02/10/21: FINDINGS: On sagittal images, the spinal cord is imaged caudally to C4 and is normal in caliber.   The contents of the posterior fossa are of normal size and position.   The pituitary gland and optic chiasm appear normal.    Brain volume appears normal.   The ventricles are normal in size and without distortion.  There are no abnormal extra-axial collections of fluid.    The cerebellum and brainstem appears normal.   The deep gray matter appears normal.  The cerebral hemispheres appear normal.   Diffusion weighted images are normal.  Susceptibility weighted images are normal.     The orbits appear normal.   The VIIth/VIIIth nerve complex appears normal.  The mastoid air cells appear normal.  The paranasal sinuses appear normal.  Flow voids are identified within the major intracerebral arteries.    After the infusion of contrast material, a normal enhancement pattern is noted.   IMPRESSION: This is a normal MRI of the brain with and without contrast.   IMPRESSION: This is a normal MR angiogram of the neck arteries with and without contrast.  TTE 02/09/21: IMPRESSIONS  1. Left ventricular ejection fraction, by estimation, is 60 to 65%. Left  ventricular ejection fraction by 3D volume is 62 %. The left ventricle has  normal function. The left ventricle has no regional wall motion  abnormalities. Left ventricular diastolic  parameters were normal. The average left ventricular global longitudinal   strain is -25.9 %.  2. Right ventricular systolic function is normal. The right ventricular  size is normal. There is normal pulmonary artery systolic pressure.  3. The mitral valve is normal in structure. Trivial mitral valve  regurgitation.  4. The aortic valve is tricuspid. Aortic valve regurgitation is not  visualized. No aortic stenosis is present.  5. The inferior vena cava is normal in size with greater than 50%  respiratory variability, suggesting right atrial pressure of 3 mmHg.   EKG:  No new tracing today  Recent Labs: 12/16/2020: BUN 12; Creatinine, Ser 0.62; Hemoglobin 13.3; Platelets 195; Potassium 4.2; Sodium 141 01/03/2021: TSH 1.140  Recent Lipid Panel No results found for: CHOL, TRIG, HDL, CHOLHDL, VLDL, LDLCALC, LDLDIRECT   Risk Assessment/Calculations:       Physical Exam:    VS:  BP 108/68   Pulse 97   Ht 5\' 3"  (1.6 m)   Wt 125 lb (56.7 kg)   LMP 03/30/2021   SpO2 99%   BMI 22.14 kg/m     Wt Readings from Last  3 Encounters:  04/18/21 125 lb (56.7 kg) (45 %, Z= -0.13)*  04/01/21 127 lb (57.6 kg) (49 %, Z= -0.03)*  03/21/21 124 lb 12.5 oz (56.6 kg) (45 %, Z= -0.13)*   * Growth percentiles are based on CDC (Girls, 2-20 Years) data.     GEN:  Well nourished, well developed in no acute distress HEENT: Normal NECK: No JVD; No carotid bruits CARDIAC: RRR, no murmurs, rubs, gallops RESPIRATORY:  Clear to auscultation without rales, wheezing or rhonchi  ABDOMEN: Soft, non-tender, non-distended MUSCULOSKELETAL:  No edema; No deformity  SKIN: Warm and dry NEUROLOGIC:  Alert and oriented x 3 PSYCHIATRIC:  Normal affect   ASSESSMENT:    1. Syncope and collapse   2. Abnormal menses   3. Palpitations    PLAN:    In order of problems listed above:  #Syncope: #Palpitations: Suspected vasovagal etiology given prodromal symptoms and worsening symptoms with prolonged standing. Given episodes of heart racing, will check cardiac monitor as well to  ensure no arrhytmias. Otherwise, TTE 02/09/21 with normal LVEF, normal strain, no significant valve disease. -Check cardiac monitor due to palpitations -Compression socks -Increase hydration and salt intake -Avoid triggers -If presyncopal, lay down and elevate legs  #Heavy Menses: Patient has been on birth control due to abnormal menses but now with heavy periods. Will refer to Dr. Elon Spanner. -Refer to Dr. Elon Spanner  Will need to continue to follow with FEMALE providers  Medication Adjustments/Labs and Tests Ordered: Current medicines are reviewed at length with the patient today.  Concerns regarding medicines are outlined above.  Orders Placed This Encounter  Procedures  . Ambulatory referral to Obstetrics / Gynecology  . LONG TERM MONITOR (3-14 DAYS)   No orders of the defined types were placed in this encounter.   Patient Instructions   Medication Instructions:   Your physician recommends that you continue on your current medications as directed. Please refer to the Current Medication list given to you today.  *If you need a refill on your cardiac medications before your next appointment, please call your pharmacy*   You have been referred to DR. ELISE LEGER WITH OBGYN TO ESTABLISH CARE   Testing/Procedures:  ZIO XT- Long Term Monitor Instructions   Your physician has requested you wear a ZIO patch monitor for _14__ days.  This is a single patch monitor.   [employer] supplies one patch monitor per enrollment. Additional stickers are not available. Please do not apply patch if you will be having a Nuclear Stress Test, Echocardiogram, Cardiac CT, MRI, or Chest Xray during the period you would be wearing the monitor. The patch cannot be worn during these tests. You cannot remove and re-apply the ZIO XT patch monitor.  Your ZIO patch monitor will be sent Fed Ex from [employer] directly to your home address. It may take 3-5 days to receive your monitor after you have been  enrolled.  Once you have received your monitor, please review the enclosed instructions. Your monitor has already been registered assigning a specific monitor serial # to you.  Billing and Patient Assistance Program Information   We have supplied [employer] with any of your insurance information on file for billing purposes. [employer] offers a sliding scale Patient Assistance Program for patients that do not have insurance, or whose insurance does not completely cover the cost of the ZIO monitor.   You must apply for the Patient Assistance Program to qualify for this discounted rate.     To apply, please call [employer]  at [phone], select option 4, then select option 2, and ask to apply for Patient Assistance Program.  Meredeth Ide will ask your household income, and how many people are in your household.  They will quote your out-of-pocket cost based on that information.  [employer] will also be able to set up a 44-month, interest-free payment plan if needed.  Applying the monitor   Shave hair from upper left chest.  Hold abrader disc by orange tab. Rub abrader in 40 strokes over the upper left chest as indicated in your monitor instructions.  Clean area with 4 enclosed alcohol pads. Let dry.  Apply patch as indicated in monitor instructions. Patch will be placed under collarbone on left side of chest with arrow pointing upward.  Rub patch adhesive wings for 2 minutes. Remove white label marked "1". Remove the white label marked "2". Rub patch adhesive wings for 2 additional minutes.  While looking in a mirror, press and release button in center of patch. A small green light will flash 3-4 times. This will be your only indicator that the monitor has been turned on. ?  Do not shower for the first 24 hours. You may shower after the first 24 hours.  Press the button if you feel a symptom. You will hear a small click. Record Date, Time and Symptom in the Patient Logbook.  When you are ready to remove the patch,  follow instructions on the last 2 pages of the Patient Logbook. Stick patch monitor onto the last page of Patient Logbook.  Place Patient Logbook in the blue and white box.  Use locking tab on box and tape box closed securely.  The blue and white box has prepaid postage on it. Please place it in the mailbox as soon as possible. Your physician should have your test results approximately 7 days after the monitor has been mailed back to [facility].  Call [facility] Customer Care at [phone] if you have questions regarding your ZIO XT patch monitor. Call them immediately if you see an orange light blinking on your monitor.  If your monitor falls off in less than 4 days, contact our Monitor department at [phone]. ?If your monitor becomes loose or falls off after 4 days call [employer] at [phone] for suggestions on securing your monitor.?   Follow-Up: At [facility], you and your health needs are our priority.  As part of our continuing mission to provide you with exceptional heart care, we have created designated Provider Care Teams.  These Care Teams include your primary Cardiologist (physician) and Advanced Practice Providers (APPs -  Physician Assistants and Nurse Practitioners) who all work together to provide you with the care you need, when you need it.  We recommend signing up for the patient portal called "MyChart".  Sign up information is provided on this After Visit Summary.  MyChart is used to connect with patients for Virtual Visits (Telemedicine).  Patients are able to view lab/test results, encounter notes, upcoming appointments, etc.  Non-urgent messages can be sent to your provider as well.   To learn more about what you can do with MyChart, go to [email or web address].    Your next appointment:   8 month(s)  The format for your next appointment:   In Person  Provider:   Laurance Flatten, MD   Other Instructions    Orthostatic Hypotension Blood  pressure is a measurement of how strongly, or weakly, your blood is pressing against the walls of your arteries. Orthostatic hypotension is a sudden  drop in blood pressure that happens when you quickly change positions, such as when you get up from sitting or lying down. Arteries are blood vessels that carry blood from your heart throughout your body. When blood pressure is too low, you may not get enough blood to your brain or to the rest of your organs. This can cause weakness, light-headedness, rapid heartbeat, and fainting. This can last for just a few seconds or for up to a few minutes. Orthostatic hypotension is usually not a serious problem. However, if it happens frequently or gets worse, it may be a sign of something more serious. What are the causes? This condition may be caused by:  Sudden changes in posture, such as standing up quickly after you have been sitting or lying down.  Blood loss.  Loss of body fluids (dehydration).  Heart problems.  Hormone (endocrine) problems.  Pregnancy.  Severe infection.  Lack of certain nutrients.  Severe allergic reactions (anaphylaxis).  Certain medicines, such as blood pressure medicine or medicines that make the body lose excess fluids (diuretics). Sometimes, this condition can be caused by not taking medicine as directed, such as taking too much of a certain medicine. What increases the risk? The following factors may make you more likely to develop this condition:  Age. Risk increases as you get older.  Conditions that affect the heart or the central nervous system.  Taking certain medicines, such as blood pressure medicine or diuretics.  Being pregnant. What are the signs or symptoms? Symptoms of this condition may include:  Weakness.  Light-headedness.  Dizziness.  Blurred vision.  Fatigue.  Rapid heartbeat.  Fainting, in severe cases. How is this diagnosed? This condition is diagnosed based on:  Your medical  history.  Your symptoms.  Your blood pressure measurement. Your health care provider will check your blood pressure when you are: ? Lying down. ? Sitting. ? Standing. A blood pressure reading is recorded as two numbers, such as "120 over 80" (or 120/80). The first ("top") number is called the systolic pressure. It is a measure of the pressure in your arteries as your heart beats. The second ("bottom") number is called the diastolic pressure. It is a measure of the pressure in your arteries when your heart relaxes between beats. Blood pressure is measured in a unit called mm Hg. Healthy blood pressure for most adults is 120/80. If your blood pressure is below 90/60, you may be diagnosed with hypotension. Other information or tests that may be used to diagnose orthostatic hypotension include:  Your other vital signs, such as your heart rate and temperature.  Blood tests.  Tilt table test. For this test, you will be safely secured to a table that moves you from a lying position to an upright position. Your heart rhythm and blood pressure will be monitored during the test. How is this treated? This condition may be treated by:  Changing your diet. This may involve eating more salt (sodium) or drinking more water.  Taking medicines to raise your blood pressure.  Changing the dosage of certain medicines you are taking that might be lowering your blood pressure.  Wearing compression stockings. These stockings help to prevent blood clots and reduce swelling in your legs. In some cases, you may need to go to the hospital for:  Fluid replacement. This means you will receive fluids through an IV.  Blood replacement. This means you will receive donated blood through an IV (transfusion).  Treating an infection or heart problems, if  this applies.  Monitoring. You may need to be monitored while medicines that you are taking wear off. Follow these instructions at home: Eating and  drinking  Drink enough fluid to keep your urine pale yellow.  Eat a healthy diet, and follow instructions from your health care provider about eating or drinking restrictions. A healthy diet includes: ? Fresh fruits and vegetables. ? Whole grains. ? Lean meats. ? Low-fat dairy products.  Eat extra salt only as directed. Do not add extra salt to your diet unless your health care provider told you to do that.  Eat frequent, small meals.  Avoid standing up suddenly after eating.   Medicines  Take over-the-counter and prescription medicines only as told by your health care provider. ? Follow instructions from your health care provider about changing the dosage of your current medicines, if this applies. ? Do not stop or adjust any of your medicines on your own. General instructions  Wear compression stockings as told by your health care provider.  Get up slowly from lying down or sitting positions. This gives your blood pressure a chance to adjust.  Avoid hot showers and excessive heat as directed by your health care provider.  Return to your normal activities as told by your health care provider. Ask your health care provider what activities are safe for you.  Do not use any products that contain nicotine or tobacco, such as cigarettes, e-cigarettes, and chewing tobacco. If you need help quitting, ask your health care provider.  Keep all follow-up visits as told by your health care provider. This is important.   Contact a health care provider if you:  Vomit.  Have diarrhea.  Have a fever for more than 2-3 days.  Feel more thirsty than usual.  Feel weak and tired. Get help right away if you:  Have chest pain.  Have a fast or irregular heartbeat.  Develop numbness in any part of your body.  Cannot move your arms or your legs.  Have trouble speaking.  Become sweaty or feel light-headed.  Faint.  Feel short of breath.  Have trouble staying awake.  Feel  confused. Summary  Orthostatic hypotension is a sudden drop in blood pressure that happens when you quickly change positions.  Orthostatic hypotension is usually not a serious problem.  It is diagnosed by having your blood pressure taken lying down, sitting, and then standing.  It may be treated by changing your diet or adjusting your medicines. This information is not intended to replace advice given to you by your health care provider. Make sure you discuss any questions you have with your health care provider. Document Revised: 05/09/2018 Document Reviewed: 05/09/2018 Elsevier Patient Education  2021 Elsevier [employer].      Signed, Meriam SpragueHeather E Darek Eifler, MD  04/18/2021 5:20 PM    [facility]

## 2021-04-18 ENCOUNTER — Other Ambulatory Visit: Payer: Self-pay

## 2021-04-18 ENCOUNTER — Encounter: Payer: Self-pay | Admitting: Cardiology

## 2021-04-18 ENCOUNTER — Ambulatory Visit (INDEPENDENT_AMBULATORY_CARE_PROVIDER_SITE_OTHER): Payer: Medicaid Other | Admitting: Cardiology

## 2021-04-18 VITALS — BP 108/68 | HR 97 | Ht 63.0 in | Wt 125.0 lb

## 2021-04-18 DIAGNOSIS — R002 Palpitations: Secondary | ICD-10-CM

## 2021-04-18 DIAGNOSIS — R55 Syncope and collapse: Secondary | ICD-10-CM | POA: Diagnosis not present

## 2021-04-18 DIAGNOSIS — N926 Irregular menstruation, unspecified: Secondary | ICD-10-CM | POA: Diagnosis not present

## 2021-04-18 NOTE — Progress Notes (Deleted)
Cardiology Office Note:    Date:  04/18/2021   ID:  Bethany Harris, DOB 01/20/2001, MRN [id number]  PCP:  Barbette Merino, NP   [facility] Providers Cardiologist:  None {   Referring MD: Barbette Merino, NP     History of Present Illness:    Bethany Harris is a 20 y.o. female with a hx of near syncope and family history of congenital heart disease who was initially seen by Dr. Izora Ribas now presenting to clinic for follow-up.  Last saw Dr. Izora Ribas on 01/18/21 where she was having episodes of near syncope over the past year. TTE obtained on 02/09/21 which showed LVEF 60-65%, normal strain significant valve disease.  Today,  She denies any chest pain, shortness of breath, palpitations, or exertional symptoms. No headaches, lightheadedness, or syncope to report. Also has no lower extremity edema, orthopnea or PND.   Past Medical History:  Diagnosis Date  . GERD (gastroesophageal reflux disease)     No past surgical history on file.  Current Medications: Current Meds  Medication Sig  . amitriptyline (ELAVIL) 50 MG tablet Take 1 tablet (50 mg total) by mouth at bedtime.  . miconazole (MICOTIN) 2 % cream Apply 1 application topically 2 (two) times daily.  . Norgestimate-Ethinyl Estradiol Triphasic (ORTHO TRI-CYCLEN LO) 0.18/0.215/0.25 MG-25 MCG tab Take 1 tablet by mouth daily.  Marland Kitchen triamcinolone ointment (KENALOG) 0.5 % Apply 1 application topically 2 (two) times daily.     Allergies:   Patient has no known allergies.   Social History   Socioeconomic History  . Marital status: Single    Spouse name: Not on file  . Number of children: Not on file  . Years of education: Not on file  . Highest education level: Not on file  Occupational History  . Occupation: Consulting civil engineer  Tobacco Use  . Smoking status: Never Smoker  . Smokeless tobacco: Never Used  Vaping Use  . Vaping Use: Never used  Substance and Sexual Activity  . Alcohol use: Never  . Drug use: Never  .  Sexual activity: Not on file  Other Topics Concern  . Not on file  Social History Narrative   Lives with dad and 6 other siblings   Right handed   Drinks caffeine rarely   Social Determinants of Health   Financial Resource Strain: Not on file  Food Insecurity: Not on file  Transportation Needs: Not on file  Physical Activity: Not on file  Stress: Not on file  Social Connections: Not on file     Family History: The patient's family history is not on file.  ROS:   Please see the history of present illness.    Review of Systems  Constitutional: Negative for chills, fever and weight loss.  HENT: Negative for congestion, hearing loss, nosebleeds and tinnitus.   Eyes: Negative for double vision, photophobia and pain.  Respiratory: Negative for hemoptysis, sputum production and shortness of breath.   Cardiovascular: Negative for chest pain, palpitations, orthopnea, claudication, leg swelling and PND.  Gastrointestinal: Negative for heartburn, nausea and vomiting.  Genitourinary: Negative for dysuria and hematuria.  Musculoskeletal: Negative for back pain, myalgias and neck pain.  Neurological: Negative for dizziness, tremors, loss of consciousness and headaches.  Endo/Heme/Allergies: Does not bruise/bleed easily.  Psychiatric/Behavioral: Negative for depression and hallucinations. The patient does not have insomnia.      EKGs/Labs/Other Studies Reviewed:    The following studies were reviewed today: Echo 02/09/2021: 1. Left ventricular ejection fraction, by estimation, is 60  to 65%. Left  ventricular ejection fraction by 3D volume is 62 %. The left ventricle has  normal function. The left ventricle has no regional wall motion  abnormalities. Left ventricular diastolic  parameters were normal. The average left ventricular global longitudinal  strain is -25.9 %.  2. Right ventricular systolic function is normal. The right ventricular  size is normal. There is normal pulmonary  artery systolic pressure.  3. The mitral valve is normal in structure. Trivial mitral valve  regurgitation.  4. The aortic valve is tricuspid. Aortic valve regurgitation is not  visualized. No aortic stenosis is present.  5. The inferior vena cava is normal in size with greater than 50%  respiratory variability, suggesting right atrial pressure of 3 mmHg.  EKG:   04/18/2021:  ***  Recent Labs: 12/16/2020: BUN 12; Creatinine, Ser 0.62; Hemoglobin 13.3; Platelets 195; Potassium 4.2; Sodium 141 01/03/2021: TSH 1.140  Recent Lipid Panel No results found for: CHOL, TRIG, HDL, CHOLHDL, VLDL, LDLCALC, LDLDIRECT   Risk Assessment/Calculations:   {Does this patient have ATRIAL FIBRILLATION?:[phone]}   Physical Exam:    VS:  BP 108/68   Pulse 97   Ht 5\' 3"  (1.6 m)   Wt 125 lb (56.7 kg)   LMP 03/30/2021   SpO2 99%   BMI 22.14 kg/m     Wt Readings from Last 3 Encounters:  04/18/21 125 lb (56.7 kg) (45 %, Z= -0.13)*  04/01/21 127 lb (57.6 kg) (49 %, Z= -0.03)*  03/21/21 124 lb 12.5 oz (56.6 kg) (45 %, Z= -0.13)*   * Growth percentiles are based on CDC (Girls, 2-20 Years) data.     GEN: Well nourished, well developed in no acute distress HEENT: Normal NECK: No JVD; No carotid bruits LYMPHATICS: No lymphadenopathy CARDIAC: RRR, no murmurs, rubs, gallops RESPIRATORY:  Clear to auscultation without rales, wheezing or rhonchi  ABDOMEN: Soft, non-tender, non-distended MUSCULOSKELETAL:  No edema; No deformity  SKIN: Warm and dry NEUROLOGIC:  Alert and oriented x 3 PSYCHIATRIC:  Normal affect   ASSESSMENT:    1. Syncope and collapse   2. Abnormal menses   3. Palpitations    PLAN:    In order of problems listed above:  #Syncope: Suspected vasovagal etiology. TTE with normal LVEF, normal strain, no significant valve disease. -Compression socks -Increase hydration -Avoid triggers -If presyncopal, lay down and elevate leg  Will need to continue to follow with FEMALE  providers   {Are you ordering a CV Procedure (e.g. stress test, cath, DCCV, TEE, etc)?   Press F2        :03/23/21   Follow-up in *** months.  Medication Adjustments/Labs and Tests Ordered: Current medicines are reviewed at length with the patient today.  Concerns regarding medicines are outlined above.  Orders Placed This Encounter  Procedures  . Ambulatory referral to Obstetrics / Gynecology  . LONG TERM MONITOR (3-14 DAYS)   No orders of the defined types were placed in this encounter.   Patient Instructions   Medication Instructions:   Your physician recommends that you continue on your current medications as directed. Please refer to the Current Medication list given to you today.  *If you need a refill on your cardiac medications before your next appointment, please call your pharmacy*   You have been referred to DR. ELISE LEGER WITH OBGYN TO ESTABLISH CARE   Testing/Procedures:  ZIO XT- Long Term Monitor Instructions   Your physician has requested you wear a ZIO patch monitor for _14__ days.  This is a single patch monitor.   [employer] supplies one patch monitor per enrollment. Additional stickers are not available. Please do not apply patch if you will be having a Nuclear Stress Test, Echocardiogram, Cardiac CT, MRI, or Chest Xray during the period you would be wearing the monitor. The patch cannot be worn during these tests. You cannot remove and re-apply the ZIO XT patch monitor.  Your ZIO patch monitor will be sent Fed Ex from [employer] directly to your home address. It may take 3-5 days to receive your monitor after you have been enrolled.  Once you have received your monitor, please review the enclosed instructions. Your monitor has already been registered assigning a specific monitor serial # to you.  Billing and Patient Assistance Program Information   We have supplied [employer] with any of your insurance information on file for billing  purposes. [employer] offers a sliding scale Patient Assistance Program for patients that do not have insurance, or whose insurance does not completely cover the cost of the ZIO monitor.   You must apply for the Patient Assistance Program to qualify for this discounted rate.     To apply, please call [employer] at [phone], select option 4, then select option 2, and ask to apply for Patient Assistance Program.  Meredeth Ide will ask your household income, and how many people are in your household.  They will quote your out-of-pocket cost based on that information.  [employer] will also be able to set up a 8-month, interest-free payment plan if needed.  Applying the monitor   Shave hair from upper left chest.  Hold abrader disc by orange tab. Rub abrader in 40 strokes over the upper left chest as indicated in your monitor instructions.  Clean area with 4 enclosed alcohol pads. Let dry.  Apply patch as indicated in monitor instructions. Patch will be placed under collarbone on left side of chest with arrow pointing upward.  Rub patch adhesive wings for 2 minutes. Remove white label marked "1". Remove the white label marked "2". Rub patch adhesive wings for 2 additional minutes.  While looking in a mirror, press and release button in center of patch. A small green light will flash 3-4 times. This will be your only indicator that the monitor has been turned on. ?  Do not shower for the first 24 hours. You may shower after the first 24 hours.  Press the button if you feel a symptom. You will hear a small click. Record Date, Time and Symptom in the Patient Logbook.  When you are ready to remove the patch, follow instructions on the last 2 pages of the Patient Logbook. Stick patch monitor onto the last page of Patient Logbook.  Place Patient Logbook in the blue and white box.  Use locking tab on box and tape box closed securely.  The blue and white box has prepaid postage on it. Please place it in the mailbox as soon  as possible. Your physician should have your test results approximately 7 days after the monitor has been mailed back to [facility].  Call [facility] Customer Care at [phone] if you have questions regarding your ZIO XT patch monitor. Call them immediately if you see an orange light blinking on your monitor.  If your monitor falls off in less than 4 days, contact our Monitor department at [phone]. ?If your monitor becomes loose or falls off after 4 days call [employer] at [phone] for suggestions on securing your monitor.?   Follow-Up: At  [facility], you and your health needs are our priority.  As part of our continuing mission to provide you with exceptional heart care, we have created designated Provider Care Teams.  These Care Teams include your primary Cardiologist (physician) and Advanced Practice Providers (APPs -  Physician Assistants and Nurse Practitioners) who all work together to provide you with the care you need, when you need it.  We recommend signing up for the patient portal called "MyChart".  Sign up information is provided on this After Visit Summary.  MyChart is used to connect with patients for Virtual Visits (Telemedicine).  Patients are able to view lab/test results, encounter notes, upcoming appointments, etc.  Non-urgent messages can be sent to your provider as well.   To learn more about what you can do with MyChart, go to [email or web address].    Your next appointment:   8 month(s)  The format for your next appointment:   In Person  Provider:   Laurance Flatten, MD   Other Instructions    Orthostatic Hypotension Blood pressure is a measurement of how strongly, or weakly, your blood is pressing against the walls of your arteries. Orthostatic hypotension is a sudden drop in blood pressure that happens when you quickly change positions, such as when you get up from sitting or lying down. Arteries are blood vessels that carry blood from  your heart throughout your body. When blood pressure is too low, you may not get enough blood to your brain or to the rest of your organs. This can cause weakness, light-headedness, rapid heartbeat, and fainting. This can last for just a few seconds or for up to a few minutes. Orthostatic hypotension is usually not a serious problem. However, if it happens frequently or gets worse, it may be a sign of something more serious. What are the causes? This condition may be caused by:  Sudden changes in posture, such as standing up quickly after you have been sitting or lying down.  Blood loss.  Loss of body fluids (dehydration).  Heart problems.  Hormone (endocrine) problems.  Pregnancy.  Severe infection.  Lack of certain nutrients.  Severe allergic reactions (anaphylaxis).  Certain medicines, such as blood pressure medicine or medicines that make the body lose excess fluids (diuretics). Sometimes, this condition can be caused by not taking medicine as directed, such as taking too much of a certain medicine. What increases the risk? The following factors may make you more likely to develop this condition:  Age. Risk increases as you get older.  Conditions that affect the heart or the central nervous system.  Taking certain medicines, such as blood pressure medicine or diuretics.  Being pregnant. What are the signs or symptoms? Symptoms of this condition may include:  Weakness.  Light-headedness.  Dizziness.  Blurred vision.  Fatigue.  Rapid heartbeat.  Fainting, in severe cases. How is this diagnosed? This condition is diagnosed based on:  Your medical history.  Your symptoms.  Your blood pressure measurement. Your health care provider will check your blood pressure when you are: ? Lying down. ? Sitting. ? Standing. A blood pressure reading is recorded as two numbers, such as "120 over 80" (or 120/80). The first ("top") number is called the systolic pressure. It  is a measure of the pressure in your arteries as your heart beats. The second ("bottom") number is called the diastolic pressure. It is a measure of the pressure in your arteries when your heart relaxes between beats. Blood pressure is measured in a  unit called mm Hg. Healthy blood pressure for most adults is 120/80. If your blood pressure is below 90/60, you may be diagnosed with hypotension. Other information or tests that may be used to diagnose orthostatic hypotension include:  Your other vital signs, such as your heart rate and temperature.  Blood tests.  Tilt table test. For this test, you will be safely secured to a table that moves you from a lying position to an upright position. Your heart rhythm and blood pressure will be monitored during the test. How is this treated? This condition may be treated by:  Changing your diet. This may involve eating more salt (sodium) or drinking more water.  Taking medicines to raise your blood pressure.  Changing the dosage of certain medicines you are taking that might be lowering your blood pressure.  Wearing compression stockings. These stockings help to prevent blood clots and reduce swelling in your legs. In some cases, you may need to go to the hospital for:  Fluid replacement. This means you will receive fluids through an IV.  Blood replacement. This means you will receive donated blood through an IV (transfusion).  Treating an infection or heart problems, if this applies.  Monitoring. You may need to be monitored while medicines that you are taking wear off. Follow these instructions at home: Eating and drinking  Drink enough fluid to keep your urine pale yellow.  Eat a healthy diet, and follow instructions from your health care provider about eating or drinking restrictions. A healthy diet includes: ? Fresh fruits and vegetables. ? Whole grains. ? Lean meats. ? Low-fat dairy products.  Eat extra salt only as directed. Do not  add extra salt to your diet unless your health care provider told you to do that.  Eat frequent, small meals.  Avoid standing up suddenly after eating.   Medicines  Take over-the-counter and prescription medicines only as told by your health care provider. ? Follow instructions from your health care provider about changing the dosage of your current medicines, if this applies. ? Do not stop or adjust any of your medicines on your own. General instructions  Wear compression stockings as told by your health care provider.  Get up slowly from lying down or sitting positions. This gives your blood pressure a chance to adjust.  Avoid hot showers and excessive heat as directed by your health care provider.  Return to your normal activities as told by your health care provider. Ask your health care provider what activities are safe for you.  Do not use any products that contain nicotine or tobacco, such as cigarettes, e-cigarettes, and chewing tobacco. If you need help quitting, ask your health care provider.  Keep all follow-up visits as told by your health care provider. This is important.   Contact a health care provider if you:  Vomit.  Have diarrhea.  Have a fever for more than 2-3 days.  Feel more thirsty than usual.  Feel weak and tired. Get help right away if you:  Have chest pain.  Have a fast or irregular heartbeat.  Develop numbness in any part of your body.  Cannot move your arms or your legs.  Have trouble speaking.  Become sweaty or feel light-headed.  Faint.  Feel short of breath.  Have trouble staying awake.  Feel confused. Summary  Orthostatic hypotension is a sudden drop in blood pressure that happens when you quickly change positions.  Orthostatic hypotension is usually not a serious problem.  It is  diagnosed by having your blood pressure taken lying down, sitting, and then standing.  It may be treated by changing your diet or adjusting your  medicines. This information is not intended to replace advice given to you by your health care provider. Make sure you discuss any questions you have with your health care provider. Document Revised: 05/09/2018 Document Reviewed: 05/09/2018 Elsevier Patient Education  2021 [employer]vier [employer].      I,Mathew Stumpf,acting as a Neurosurgeon for Meriam Sprague, MD.,have documented all relevant documentation on the behalf of Meriam Sprague, MD,as directed by  Meriam Sprague, MD while in the presence of Meriam Sprague, MD.  ***  Signed, Meriam Sprague, MD  04/18/2021 5:24 PM    [facility]

## 2021-04-18 NOTE — Patient Instructions (Addendum)
Medication Instructions:   Your physician recommends that you continue on your current medications as directed. Please refer to the Current Medication list given to you today.  *If you need a refill on your cardiac medications before your next appointment, please call your pharmacy*   You have been referred to DR. ELISE LEGER WITH OBGYN TO ESTABLISH CARE   Testing/Procedures:  ZIO XT- Long Term Monitor Instructions   Your physician has requested you wear a ZIO patch monitor for _14__ days.  This is a single patch monitor.   [employer] supplies one patch monitor per enrollment. Additional stickers are not available. Please do not apply patch if you will be having a Nuclear Stress Test, Echocardiogram, Cardiac CT, MRI, or Chest Xray during the period you would be wearing the monitor. The patch cannot be worn during these tests. You cannot remove and re-apply the ZIO XT patch monitor.  Your ZIO patch monitor will be sent Fed Ex from [employer] directly to your home address. It may take 3-5 days to receive your monitor after you have been enrolled.  Once you have received your monitor, please review the enclosed instructions. Your monitor has already been registered assigning a specific monitor serial # to you.  Billing and Patient Assistance Program Information   We have supplied [employer] with any of your insurance information on file for billing purposes. [employer] offers a sliding scale Patient Assistance Program for patients that do not have insurance, or whose insurance does not completely cover the cost of the ZIO monitor.   You must apply for the Patient Assistance Program to qualify for this discounted rate.     To apply, please call [employer] at [phone], select option 4, then select option 2, and ask to apply for Patient Assistance Program.  Meredeth Ide will ask your household income, and how many people are in your household.  They will quote your out-of-pocket cost based on that  information.  [employer] will also be able to set up a 5-month, interest-free payment plan if needed.  Applying the monitor   Shave hair from upper left chest.  Hold abrader disc by orange tab. Rub abrader in 40 strokes over the upper left chest as indicated in your monitor instructions.  Clean area with 4 enclosed alcohol pads. Let dry.  Apply patch as indicated in monitor instructions. Patch will be placed under collarbone on left side of chest with arrow pointing upward.  Rub patch adhesive wings for 2 minutes. Remove white label marked "1". Remove the white label marked "2". Rub patch adhesive wings for 2 additional minutes.  While looking in a mirror, press and release button in center of patch. A small green light will flash 3-4 times. This will be your only indicator that the monitor has been turned on. ?  Do not shower for the first 24 hours. You may shower after the first 24 hours.  Press the button if you feel a symptom. You will hear a small click. Record Date, Time and Symptom in the Patient Logbook.  When you are ready to remove the patch, follow instructions on the last 2 pages of the Patient Logbook. Stick patch monitor onto the last page of Patient Logbook.  Place Patient Logbook in the blue and white box.  Use locking tab on box and tape box closed securely.  The blue and white box has prepaid postage on it. Please place it in the mailbox as soon as possible. Your physician should have your test results  approximately 7 days after the monitor has been [facility].  Call [facility] Customer Care at [phone] if you have questions regarding your ZIO XT patch monitor. Call them immediately if you see an orange light blinking on your monitor.  If your monitor falls off in less than 4 days, contact our Monitor department at [phone]. ?If your monitor becomes loose or falls off after 4 days call [employer] at [phone] for suggestions on securing your  monitor.?   Follow-Up: At [facility], you and your health needs are our priority.  As part of our continuing mission to provide you with exceptional heart care, we have created designated Provider Care Teams.  These Care Teams include your primary Cardiologist (physician) and Advanced Practice Providers (APPs -  Physician Assistants and Nurse Practitioners) who all work together to provide you with the care you need, when you need it.  We recommend signing up for the patient portal called "MyChart".  Sign up information is provided on this After Visit Summary.  MyChart is used to connect with patients for Virtual Visits (Telemedicine).  Patients are able to view lab/test results, encounter notes, upcoming appointments, etc.  Non-urgent messages can be sent to your provider as well.   To learn more about what you can do with MyChart, go to [email or web address].    Your next appointment:   8 month(s)  The format for your next appointment:   In Person  Provider:   Laurance FlattenHeather Pemberton, MD   Other Instructions    Orthostatic Hypotension Blood pressure is a measurement of how strongly, or weakly, your blood is pressing against the walls of your arteries. Orthostatic hypotension is a sudden drop in blood pressure that happens when you quickly change positions, such as when you get up from sitting or lying down. Arteries are blood vessels that carry blood from your heart throughout your body. When blood pressure is too low, you may not get enough blood to your brain or to the rest of your organs. This can cause weakness, light-headedness, rapid heartbeat, and fainting. This can last for just a few seconds or for up to a few minutes. Orthostatic hypotension is usually not a serious problem. However, if it happens frequently or gets worse, it may be a sign of something more serious. What are the causes? This condition may be caused by:  Sudden changes in posture, such as standing up quickly  after you have been sitting or lying down.  Blood loss.  Loss of body fluids (dehydration).  Heart problems.  Hormone (endocrine) problems.  Pregnancy.  Severe infection.  Lack of certain nutrients.  Severe allergic reactions (anaphylaxis).  Certain medicines, such as blood pressure medicine or medicines that make the body lose excess fluids (diuretics). Sometimes, this condition can be caused by not taking medicine as directed, such as taking too much of a certain medicine. What increases the risk? The following factors may make you more likely to develop this condition:  Age. Risk increases as you get older.  Conditions that affect the heart or the central nervous system.  Taking certain medicines, such as blood pressure medicine or diuretics.  Being pregnant. What are the signs or symptoms? Symptoms of this condition may include:  Weakness.  Light-headedness.  Dizziness.  Blurred vision.  Fatigue.  Rapid heartbeat.  Fainting, in severe cases. How is this diagnosed? This condition is diagnosed based on:  Your medical history.  Your symptoms.  Your blood pressure measurement. Your health care provider  will check your blood pressure when you are: ? Lying down. ? Sitting. ? Standing. A blood pressure reading is recorded as two numbers, such as "120 over 80" (or 120/80). The first ("top") number is called the systolic pressure. It is a measure of the pressure in your arteries as your heart beats. The second ("bottom") number is called the diastolic pressure. It is a measure of the pressure in your arteries when your heart relaxes between beats. Blood pressure is measured in a unit called mm Hg. Healthy blood pressure for most adults is 120/80. If your blood pressure is below 90/60, you may be diagnosed with hypotension. Other information or tests that may be used to diagnose orthostatic hypotension include:  Your other vital signs, such as your heart rate and  temperature.  Blood tests.  Tilt table test. For this test, you will be safely secured to a table that moves you from a lying position to an upright position. Your heart rhythm and blood pressure will be monitored during the test. How is this treated? This condition may be treated by:  Changing your diet. This may involve eating more salt (sodium) or drinking more water.  Taking medicines to raise your blood pressure.  Changing the dosage of certain medicines you are taking that might be lowering your blood pressure.  Wearing compression stockings. These stockings help to prevent blood clots and reduce swelling in your legs. In some cases, you may need to go to the hospital for:  Fluid replacement. This means you will receive fluids through an IV.  Blood replacement. This means you will receive donated blood through an IV (transfusion).  Treating an infection or heart problems, if this applies.  Monitoring. You may need to be monitored while medicines that you are taking wear off. Follow these instructions at home: Eating and drinking  Drink enough fluid to keep your urine pale yellow.  Eat a healthy diet, and follow instructions from your health care provider about eating or drinking restrictions. A healthy diet includes: ? Fresh fruits and vegetables. ? Whole grains. ? Lean meats. ? Low-fat dairy products.  Eat extra salt only as directed. Do not add extra salt to your diet unless your health care provider told you to do that.  Eat frequent, small meals.  Avoid standing up suddenly after eating.   Medicines  Take over-the-counter and prescription medicines only as told by your health care provider. ? Follow instructions from your health care provider about changing the dosage of your current medicines, if this applies. ? Do not stop or adjust any of your medicines on your own. General instructions  Wear compression stockings as told by your health care  provider.  Get up slowly from lying down or sitting positions. This gives your blood pressure a chance to adjust.  Avoid hot showers and excessive heat as directed by your health care provider.  Return to your normal activities as told by your health care provider. Ask your health care provider what activities are safe for you.  Do not use any products that contain nicotine or tobacco, such as cigarettes, e-cigarettes, and chewing tobacco. If you need help quitting, ask your health care provider.  Keep all follow-up visits as told by your health care provider. This is important.   Contact a health care provider if you:  Vomit.  Have diarrhea.  Have a fever for more than 2-3 days.  Feel more thirsty than usual.  Feel weak and tired. Get help right  away if you:  Have chest pain.  Have a fast or irregular heartbeat.  Develop numbness in any part of your body.  Cannot move your arms or your legs.  Have trouble speaking.  Become sweaty or feel light-headed.  Faint.  Feel short of breath.  Have trouble staying awake.  Feel confused. Summary  Orthostatic hypotension is a sudden drop in blood pressure that happens when you quickly change positions.  Orthostatic hypotension is usually not a serious problem.  It is diagnosed by having your blood pressure taken lying down, sitting, and then standing.  It may be treated by changing your diet or adjusting your medicines. This information is not intended to replace advice given to you by your health care provider. Make sure you discuss any questions you have with your health care provider. Document Revised: 05/09/2018 Document Reviewed: 05/09/2018 Elsevier Patient Education  2021 [employer].

## 2021-04-18 NOTE — Progress Notes (Deleted)
Cardiology Office Note:    Date:  04/18/2021   ID:  Bethany Harris, DOB 01/20/2001, MRN [id number]  PCP:  Barbette Merino, NP   [facility] Providers Cardiologist:  None {   Referring MD: Barbette Merino, NP     History of Present Illness:    Bethany Harris is a 20 y.o. female with a hx of near syncope and family history of congenital heart disease who was initially seen by Dr. Izora Ribas now presenting to clinic for follow-up.  Last saw Dr. Izora Ribas on 01/18/21 where she was having episodes of near syncope over the past year. TTE obtained on 02/09/21 which showed LVEF 60-65%, normal strain significant valve disease.  Today,  She denies any chest pain, shortness of breath, palpitations, or exertional symptoms. No headaches, lightheadedness, or syncope to report. Also has no lower extremity edema, orthopnea or PND.   Past Medical History:  Diagnosis Date  . GERD (gastroesophageal reflux disease)     No past surgical history on file.  Current Medications: Current Meds  Medication Sig  . amitriptyline (ELAVIL) 50 MG tablet Take 1 tablet (50 mg total) by mouth at bedtime.  . miconazole (MICOTIN) 2 % cream Apply 1 application topically 2 (two) times daily.  . Norgestimate-Ethinyl Estradiol Triphasic (ORTHO TRI-CYCLEN LO) 0.18/0.215/0.25 MG-25 MCG tab Take 1 tablet by mouth daily.  Marland Kitchen triamcinolone ointment (KENALOG) 0.5 % Apply 1 application topically 2 (two) times daily.     Allergies:   Patient has no known allergies.   Social History   Socioeconomic History  . Marital status: Single    Spouse name: Not on file  . Number of children: Not on file  . Years of education: Not on file  . Highest education level: Not on file  Occupational History  . Occupation: Consulting civil engineer  Tobacco Use  . Smoking status: Never Smoker  . Smokeless tobacco: Never Used  Vaping Use  . Vaping Use: Never used  Substance and Sexual Activity  . Alcohol use: Never  . Drug use: Never  .  Sexual activity: Not on file  Other Topics Concern  . Not on file  Social History Narrative   Lives with dad and 6 other siblings   Right handed   Drinks caffeine rarely   Social Determinants of Health   Financial Resource Strain: Not on file  Food Insecurity: Not on file  Transportation Needs: Not on file  Physical Activity: Not on file  Stress: Not on file  Social Connections: Not on file     Family History: The patient's family history is not on file.  ROS:   Please see the history of present illness.    Review of Systems  Constitutional: Negative for chills, fever and weight loss.  HENT: Negative for congestion, hearing loss, nosebleeds and tinnitus.   Eyes: Negative for double vision, photophobia and pain.  Respiratory: Negative for hemoptysis, sputum production and shortness of breath.   Cardiovascular: Negative for chest pain, palpitations, orthopnea, claudication, leg swelling and PND.  Gastrointestinal: Negative for heartburn, nausea and vomiting.  Genitourinary: Negative for dysuria and hematuria.  Musculoskeletal: Negative for back pain, myalgias and neck pain.  Neurological: Negative for dizziness, tremors, loss of consciousness and headaches.  Endo/Heme/Allergies: Does not bruise/bleed easily.  Psychiatric/Behavioral: Negative for depression and hallucinations. The patient does not have insomnia.      EKGs/Labs/Other Studies Reviewed:    The following studies were reviewed today: Echo 02/09/2021: 1. Left ventricular ejection fraction, by estimation, is 60  to 65%. Left  ventricular ejection fraction by 3D volume is 62 %. The left ventricle has  normal function. The left ventricle has no regional wall motion  abnormalities. Left ventricular diastolic  parameters were normal. The average left ventricular global longitudinal  strain is -25.9 %.  2. Right ventricular systolic function is normal. The right ventricular  size is normal. There is normal pulmonary  artery systolic pressure.  3. The mitral valve is normal in structure. Trivial mitral valve  regurgitation.  4. The aortic valve is tricuspid. Aortic valve regurgitation is not  visualized. No aortic stenosis is present.  5. The inferior vena cava is normal in size with greater than 50%  respiratory variability, suggesting right atrial pressure of 3 mmHg.  EKG:   04/18/2021:  ***  Recent Labs: 12/16/2020: BUN 12; Creatinine, Ser 0.62; Hemoglobin 13.3; Platelets 195; Potassium 4.2; Sodium 141 01/03/2021: TSH 1.140  Recent Lipid Panel No results found for: CHOL, TRIG, HDL, CHOLHDL, VLDL, LDLCALC, LDLDIRECT   Risk Assessment/Calculations:   {Does this patient have ATRIAL FIBRILLATION?:[phone]}   Physical Exam:    VS:  BP 108/68   Pulse 97   Ht 5\' 3"  (1.6 m)   Wt 125 lb (56.7 kg)   LMP 03/30/2021   SpO2 99%   BMI 22.14 kg/m     Wt Readings from Last 3 Encounters:  04/18/21 125 lb (56.7 kg) (45 %, Z= -0.13)*  04/01/21 127 lb (57.6 kg) (49 %, Z= -0.03)*  03/21/21 124 lb 12.5 oz (56.6 kg) (45 %, Z= -0.13)*   * Growth percentiles are based on CDC (Girls, 2-20 Years) data.     GEN: Well nourished, well developed in no acute distress HEENT: Normal NECK: No JVD; No carotid bruits LYMPHATICS: No lymphadenopathy CARDIAC: RRR, no murmurs, rubs, gallops RESPIRATORY:  Clear to auscultation without rales, wheezing or rhonchi  ABDOMEN: Soft, non-tender, non-distended MUSCULOSKELETAL:  No edema; No deformity  SKIN: Warm and dry NEUROLOGIC:  Alert and oriented x 3 PSYCHIATRIC:  Normal affect   ASSESSMENT:    1. Syncope and collapse   2. Abnormal menses   3. Palpitations    PLAN:    In order of problems listed above:  #Syncope: Suspected vasovagal etiology. TTE with normal LVEF, normal strain, no significant valve disease. -Compression socks -Increase hydration -Avoid triggers -If presyncopal, lay down and elevate leg  Will need to continue to follow with FEMALE  providers   {Are you ordering a CV Procedure (e.g. stress test, cath, DCCV, TEE, etc)?   Press F2        :03/23/21   Follow-up in *** months.  Medication Adjustments/Labs and Tests Ordered: Current medicines are reviewed at length with the patient today.  Concerns regarding medicines are outlined above.  Orders Placed This Encounter  Procedures  . Ambulatory referral to Obstetrics / Gynecology  . LONG TERM MONITOR (3-14 DAYS)   No orders of the defined types were placed in this encounter.   Patient Instructions   Medication Instructions:   Your physician recommends that you continue on your current medications as directed. Please refer to the Current Medication list given to you today.  *If you need a refill on your cardiac medications before your next appointment, please call your pharmacy*   You have been referred to DR. ELISE LEGER WITH OBGYN TO ESTABLISH CARE   Testing/Procedures:  ZIO XT- Long Term Monitor Instructions   Your physician has requested you wear a ZIO patch monitor for _14__ days.  This is a single patch monitor.   [employer] supplies one patch monitor per enrollment. Additional stickers are not available. Please do not apply patch if you will be having a Nuclear Stress Test, Echocardiogram, Cardiac CT, MRI, or Chest Xray during the period you would be wearing the monitor. The patch cannot be worn during these tests. You cannot remove and re-apply the ZIO XT patch monitor.  Your ZIO patch monitor will be sent Fed Ex from [employer] directly to your home address. It may take 3-5 days to receive your monitor after you have been enrolled.  Once you have received your monitor, please review the enclosed instructions. Your monitor has already been registered assigning a specific monitor serial # to you.  Billing and Patient Assistance Program Information   We have supplied [employer] with any of your insurance information on file for billing  purposes. [employer] offers a sliding scale Patient Assistance Program for patients that do not have insurance, or whose insurance does not completely cover the cost of the ZIO monitor.   You must apply for the Patient Assistance Program to qualify for this discounted rate.     To apply, please call [employer] at [phone], select option 4, then select option 2, and ask to apply for Patient Assistance Program.  Meredeth Ide will ask your household income, and how many people are in your household.  They will quote your out-of-pocket cost based on that information.  [employer] will also be able to set up a 8-month, interest-free payment plan if needed.  Applying the monitor   Shave hair from upper left chest.  Hold abrader disc by orange tab. Rub abrader in 40 strokes over the upper left chest as indicated in your monitor instructions.  Clean area with 4 enclosed alcohol pads. Let dry.  Apply patch as indicated in monitor instructions. Patch will be placed under collarbone on left side of chest with arrow pointing upward.  Rub patch adhesive wings for 2 minutes. Remove white label marked "1". Remove the white label marked "2". Rub patch adhesive wings for 2 additional minutes.  While looking in a mirror, press and release button in center of patch. A small green light will flash 3-4 times. This will be your only indicator that the monitor has been turned on. ?  Do not shower for the first 24 hours. You may shower after the first 24 hours.  Press the button if you feel a symptom. You will hear a small click. Record Date, Time and Symptom in the Patient Logbook.  When you are ready to remove the patch, follow instructions on the last 2 pages of the Patient Logbook. Stick patch monitor onto the last page of Patient Logbook.  Place Patient Logbook in the blue and white box.  Use locking tab on box and tape box closed securely.  The blue and white box has prepaid postage on it. Please place it in the mailbox as soon  as possible. Your physician should have your test results approximately 7 days after the monitor has been mailed back to [facility].  Call [facility] Customer Care at [phone] if you have questions regarding your ZIO XT patch monitor. Call them immediately if you see an orange light blinking on your monitor.  If your monitor falls off in less than 4 days, contact our Monitor department at [phone]. ?If your monitor becomes loose or falls off after 4 days call [employer] at [phone] for suggestions on securing your monitor.?   Follow-Up: At  [facility], you and your health needs are our priority.  As part of our continuing mission to provide you with exceptional heart care, we have created designated Provider Care Teams.  These Care Teams include your primary Cardiologist (physician) and Advanced Practice Providers (APPs -  Physician Assistants and Nurse Practitioners) who all work together to provide you with the care you need, when you need it.  We recommend signing up for the patient portal called "MyChart".  Sign up information is provided on this After Visit Summary.  MyChart is used to connect with patients for Virtual Visits (Telemedicine).  Patients are able to view lab/test results, encounter notes, upcoming appointments, etc.  Non-urgent messages can be sent to your provider as well.   To learn more about what you can do with MyChart, go to [email or web address].    Your next appointment:   8 month(s)  The format for your next appointment:   In Person  Provider:   Laurance Flatten, MD   Other Instructions    Orthostatic Hypotension Blood pressure is a measurement of how strongly, or weakly, your blood is pressing against the walls of your arteries. Orthostatic hypotension is a sudden drop in blood pressure that happens when you quickly change positions, such as when you get up from sitting or lying down. Arteries are blood vessels that carry blood from  your heart throughout your body. When blood pressure is too low, you may not get enough blood to your brain or to the rest of your organs. This can cause weakness, light-headedness, rapid heartbeat, and fainting. This can last for just a few seconds or for up to a few minutes. Orthostatic hypotension is usually not a serious problem. However, if it happens frequently or gets worse, it may be a sign of something more serious. What are the causes? This condition may be caused by:  Sudden changes in posture, such as standing up quickly after you have been sitting or lying down.  Blood loss.  Loss of body fluids (dehydration).  Heart problems.  Hormone (endocrine) problems.  Pregnancy.  Severe infection.  Lack of certain nutrients.  Severe allergic reactions (anaphylaxis).  Certain medicines, such as blood pressure medicine or medicines that make the body lose excess fluids (diuretics). Sometimes, this condition can be caused by not taking medicine as directed, such as taking too much of a certain medicine. What increases the risk? The following factors may make you more likely to develop this condition:  Age. Risk increases as you get older.  Conditions that affect the heart or the central nervous system.  Taking certain medicines, such as blood pressure medicine or diuretics.  Being pregnant. What are the signs or symptoms? Symptoms of this condition may include:  Weakness.  Light-headedness.  Dizziness.  Blurred vision.  Fatigue.  Rapid heartbeat.  Fainting, in severe cases. How is this diagnosed? This condition is diagnosed based on:  Your medical history.  Your symptoms.  Your blood pressure measurement. Your health care provider will check your blood pressure when you are: ? Lying down. ? Sitting. ? Standing. A blood pressure reading is recorded as two numbers, such as "120 over 80" (or 120/80). The first ("top") number is called the systolic pressure. It  is a measure of the pressure in your arteries as your heart beats. The second ("bottom") number is called the diastolic pressure. It is a measure of the pressure in your arteries when your heart relaxes between beats. Blood pressure is measured in a  unit called mm Hg. Healthy blood pressure for most adults is 120/80. If your blood pressure is below 90/60, you may be diagnosed with hypotension. Other information or tests that may be used to diagnose orthostatic hypotension include:  Your other vital signs, such as your heart rate and temperature.  Blood tests.  Tilt table test. For this test, you will be safely secured to a table that moves you from a lying position to an upright position. Your heart rhythm and blood pressure will be monitored during the test. How is this treated? This condition may be treated by:  Changing your diet. This may involve eating more salt (sodium) or drinking more water.  Taking medicines to raise your blood pressure.  Changing the dosage of certain medicines you are taking that might be lowering your blood pressure.  Wearing compression stockings. These stockings help to prevent blood clots and reduce swelling in your legs. In some cases, you may need to go to the hospital for:  Fluid replacement. This means you will receive fluids through an IV.  Blood replacement. This means you will receive donated blood through an IV (transfusion).  Treating an infection or heart problems, if this applies.  Monitoring. You may need to be monitored while medicines that you are taking wear off. Follow these instructions at home: Eating and drinking  Drink enough fluid to keep your urine pale yellow.  Eat a healthy diet, and follow instructions from your health care provider about eating or drinking restrictions. A healthy diet includes: ? Fresh fruits and vegetables. ? Whole grains. ? Lean meats. ? Low-fat dairy products.  Eat extra salt only as directed. Do not  add extra salt to your diet unless your health care provider told you to do that.  Eat frequent, small meals.  Avoid standing up suddenly after eating.   Medicines  Take over-the-counter and prescription medicines only as told by your health care provider. ? Follow instructions from your health care provider about changing the dosage of your current medicines, if this applies. ? Do not stop or adjust any of your medicines on your own. General instructions  Wear compression stockings as told by your health care provider.  Get up slowly from lying down or sitting positions. This gives your blood pressure a chance to adjust.  Avoid hot showers and excessive heat as directed by your health care provider.  Return to your normal activities as told by your health care provider. Ask your health care provider what activities are safe for you.  Do not use any products that contain nicotine or tobacco, such as cigarettes, e-cigarettes, and chewing tobacco. If you need help quitting, ask your health care provider.  Keep all follow-up visits as told by your health care provider. This is important.   Contact a health care provider if you:  Vomit.  Have diarrhea.  Have a fever for more than 2-3 days.  Feel more thirsty than usual.  Feel weak and tired. Get help right away if you:  Have chest pain.  Have a fast or irregular heartbeat.  Develop numbness in any part of your body.  Cannot move your arms or your legs.  Have trouble speaking.  Become sweaty or feel light-headed.  Faint.  Feel short of breath.  Have trouble staying awake.  Feel confused. Summary  Orthostatic hypotension is a sudden drop in blood pressure that happens when you quickly change positions.  Orthostatic hypotension is usually not a serious problem.  It is  diagnosed by having your blood pressure taken lying down, sitting, and then standing.  It may be treated by changing your diet or adjusting your  medicines. This information is not intended to replace advice given to you by your health care provider. Make sure you discuss any questions you have with your health care provider. Document Revised: 05/09/2018 Document Reviewed: 05/09/2018 Elsevier Patient Education  2021 Elsevier Inc.      I,Mathew Stumpf,acting as a Neurosurgeon for Meriam Sprague, MD.,have documented all relevant documentation on the behalf of Meriam Sprague, MD,as directed by  Meriam Sprague, MD while in the presence of Meriam Sprague, MD.  ***  Signed, Meriam Sprague, MD  04/18/2021 5:22 PM    [facility]

## 2021-04-18 NOTE — Progress Notes (Deleted)
Cardiology Office Note:    Date:  04/18/2021   ID:  Bethany Harris, DOB 01/20/2001, MRN [id number]  PCP:  Barbette Merino, NP   [facility] Providers Cardiologist:  None {   Referring MD: Barbette Merino, NP     History of Present Illness:    Bethany Harris is a 20 y.o. female with a hx of near syncope and family history of congenital heart disease who was initially seen by Dr. Izora Ribas now presenting to clinic for follow-up.  Last saw Dr. Izora Ribas on 01/18/21 where she was having episodes of near syncope over the past year. TTE obtained on 02/09/21 which showed LVEF 60-65%, normal strain significant valve disease.  Today,  She denies any chest pain, shortness of breath, palpitations, or exertional symptoms. No headaches, lightheadedness, or syncope to report. Also has no lower extremity edema, orthopnea or PND.   Past Medical History:  Diagnosis Date  . GERD (gastroesophageal reflux disease)     No past surgical history on file.  Current Medications: Current Meds  Medication Sig  . amitriptyline (ELAVIL) 50 MG tablet Take 1 tablet (50 mg total) by mouth at bedtime.  . miconazole (MICOTIN) 2 % cream Apply 1 application topically 2 (two) times daily.  . Norgestimate-Ethinyl Estradiol Triphasic (ORTHO TRI-CYCLEN LO) 0.18/0.215/0.25 MG-25 MCG tab Take 1 tablet by mouth daily.  Marland Kitchen triamcinolone ointment (KENALOG) 0.5 % Apply 1 application topically 2 (two) times daily.     Allergies:   Patient has no known allergies.   Social History   Socioeconomic History  . Marital status: Single    Spouse name: Not on file  . Number of children: Not on file  . Years of education: Not on file  . Highest education level: Not on file  Occupational History  . Occupation: Consulting civil engineer  Tobacco Use  . Smoking status: Never Smoker  . Smokeless tobacco: Never Used  Vaping Use  . Vaping Use: Never used  Substance and Sexual Activity  . Alcohol use: Never  . Drug use: Never  .  Sexual activity: Not on file  Other Topics Concern  . Not on file  Social History Narrative   Lives with dad and 6 other siblings   Right handed   Drinks caffeine rarely   Social Determinants of Health   Financial Resource Strain: Not on file  Food Insecurity: Not on file  Transportation Needs: Not on file  Physical Activity: Not on file  Stress: Not on file  Social Connections: Not on file     Family History: The patient's family history is not on file.  ROS:   Please see the history of present illness.    Review of Systems  Constitutional: Negative for chills, fever and weight loss.  HENT: Negative for congestion, hearing loss, nosebleeds and tinnitus.   Eyes: Negative for double vision, photophobia and pain.  Respiratory: Negative for hemoptysis, sputum production and shortness of breath.   Cardiovascular: Negative for chest pain, palpitations, orthopnea, claudication, leg swelling and PND.  Gastrointestinal: Negative for heartburn, nausea and vomiting.  Genitourinary: Negative for dysuria and hematuria.  Musculoskeletal: Negative for back pain, myalgias and neck pain.  Neurological: Negative for dizziness, tremors, loss of consciousness and headaches.  Endo/Heme/Allergies: Does not bruise/bleed easily.  Psychiatric/Behavioral: Negative for depression and hallucinations. The patient does not have insomnia.      EKGs/Labs/Other Studies Reviewed:    The following studies were reviewed today: Echo 02/09/2021: 1. Left ventricular ejection fraction, by estimation, is 60  to 65%. Left  ventricular ejection fraction by 3D volume is 62 %. The left ventricle has  normal function. The left ventricle has no regional wall motion  abnormalities. Left ventricular diastolic  parameters were normal. The average left ventricular global longitudinal  strain is -25.9 %.  2. Right ventricular systolic function is normal. The right ventricular  size is normal. There is normal pulmonary  artery systolic pressure.  3. The mitral valve is normal in structure. Trivial mitral valve  regurgitation.  4. The aortic valve is tricuspid. Aortic valve regurgitation is not  visualized. No aortic stenosis is present.  5. The inferior vena cava is normal in size with greater than 50%  respiratory variability, suggesting right atrial pressure of 3 mmHg.  EKG:   04/18/2021:  ***  Recent Labs: 12/16/2020: BUN 12; Creatinine, Ser 0.62; Hemoglobin 13.3; Platelets 195; Potassium 4.2; Sodium 141 01/03/2021: TSH 1.140  Recent Lipid Panel No results found for: CHOL, TRIG, HDL, CHOLHDL, VLDL, LDLCALC, LDLDIRECT   Risk Assessment/Calculations:   {Does this patient have ATRIAL FIBRILLATION?:[phone]}   Physical Exam:    VS:  BP 108/68   Pulse 97   Ht 5\' 3"  (1.6 m)   Wt 125 lb (56.7 kg)   LMP 03/30/2021   SpO2 99%   BMI 22.14 kg/m     Wt Readings from Last 3 Encounters:  04/18/21 125 lb (56.7 kg) (45 %, Z= -0.13)*  04/01/21 127 lb (57.6 kg) (49 %, Z= -0.03)*  03/21/21 124 lb 12.5 oz (56.6 kg) (45 %, Z= -0.13)*   * Growth percentiles are based on CDC (Girls, 2-20 Years) data.     GEN: Well nourished, well developed in no acute distress HEENT: Normal NECK: No JVD; No carotid bruits LYMPHATICS: No lymphadenopathy CARDIAC: RRR, no murmurs, rubs, gallops RESPIRATORY:  Clear to auscultation without rales, wheezing or rhonchi  ABDOMEN: Soft, non-tender, non-distended MUSCULOSKELETAL:  No edema; No deformity  SKIN: Warm and dry NEUROLOGIC:  Alert and oriented x 3 PSYCHIATRIC:  Normal affect   ASSESSMENT:    1. Syncope and collapse   2. Abnormal menses   3. Palpitations    PLAN:    In order of problems listed above:  #Syncope: Suspected vasovagal etiology. TTE with normal LVEF, normal strain, no significant valve disease. -Compression socks -Increase hydration -Avoid triggers -If presyncopal, lay down and elevate leg  Will need to continue to follow with FEMALE  providers   {Are you ordering a CV Procedure (e.g. stress test, cath, DCCV, TEE, etc)?   Press F2        :03/23/21   Follow-up in *** months.  Medication Adjustments/Labs and Tests Ordered: Current medicines are reviewed at length with the patient today.  Concerns regarding medicines are outlined above.  Orders Placed This Encounter  Procedures  . Ambulatory referral to Obstetrics / Gynecology  . LONG TERM MONITOR (3-14 DAYS)   No orders of the defined types were placed in this encounter.   Patient Instructions   Medication Instructions:   Your physician recommends that you continue on your current medications as directed. Please refer to the Current Medication list given to you today.  *If you need a refill on your cardiac medications before your next appointment, please call your pharmacy*   You have been referred to DR. ELISE LEGER WITH OBGYN TO ESTABLISH CARE   Testing/Procedures:  ZIO XT- Long Term Monitor Instructions   Your physician has requested you wear a ZIO patch monitor for _14__ days.  This is a single patch monitor.   [employer] supplies one patch monitor per enrollment. Additional stickers are not available. Please do not apply patch if you will be having a Nuclear Stress Test, Echocardiogram, Cardiac CT, MRI, or Chest Xray during the period you would be wearing the monitor. The patch cannot be worn during these tests. You cannot remove and re-apply the ZIO XT patch monitor.  Your ZIO patch monitor will be sent Fed Ex from [employer] directly to your home address. It may take 3-5 days to receive your monitor after you have been enrolled.  Once you have received your monitor, please review the enclosed instructions. Your monitor has already been registered assigning a specific monitor serial # to you.  Billing and Patient Assistance Program Information   We have supplied [employer] with any of your insurance information on file for billing  purposes. [employer] offers a sliding scale Patient Assistance Program for patients that do not have insurance, or whose insurance does not completely cover the cost of the ZIO monitor.   You must apply for the Patient Assistance Program to qualify for this discounted rate.     To apply, please call [employer] at [phone], select option 4, then select option 2, and ask to apply for Patient Assistance Program.  Meredeth Ide will ask your household income, and how many people are in your household.  They will quote your out-of-pocket cost based on that information.  [employer] will also be able to set up a 8-month, interest-free payment plan if needed.  Applying the monitor   Shave hair from upper left chest.  Hold abrader disc by orange tab. Rub abrader in 40 strokes over the upper left chest as indicated in your monitor instructions.  Clean area with 4 enclosed alcohol pads. Let dry.  Apply patch as indicated in monitor instructions. Patch will be placed under collarbone on left side of chest with arrow pointing upward.  Rub patch adhesive wings for 2 minutes. Remove white label marked "1". Remove the white label marked "2". Rub patch adhesive wings for 2 additional minutes.  While looking in a mirror, press and release button in center of patch. A small green light will flash 3-4 times. This will be your only indicator that the monitor has been turned on. ?  Do not shower for the first 24 hours. You may shower after the first 24 hours.  Press the button if you feel a symptom. You will hear a small click. Record Date, Time and Symptom in the Patient Logbook.  When you are ready to remove the patch, follow instructions on the last 2 pages of the Patient Logbook. Stick patch monitor onto the last page of Patient Logbook.  Place Patient Logbook in the blue and white box.  Use locking tab on box and tape box closed securely.  The blue and white box has prepaid postage on it. Please place it in the mailbox as soon  as possible. Your physician should have your test results approximately 7 days after the monitor has been mailed back to [facility].  Call [facility] Customer Care at [phone] if you have questions regarding your ZIO XT patch monitor. Call them immediately if you see an orange light blinking on your monitor.  If your monitor falls off in less than 4 days, contact our Monitor department at [phone]. ?If your monitor becomes loose or falls off after 4 days call [employer] at [phone] for suggestions on securing your monitor.?   Follow-Up: At  [facility], you and your health needs are our priority.  As part of our continuing mission to provide you with exceptional heart care, we have created designated Provider Care Teams.  These Care Teams include your primary Cardiologist (physician) and Advanced Practice Providers (APPs -  Physician Assistants and Nurse Practitioners) who all work together to provide you with the care you need, when you need it.  We recommend signing up for the patient portal called "MyChart".  Sign up information is provided on this After Visit Summary.  MyChart is used to connect with patients for Virtual Visits (Telemedicine).  Patients are able to view lab/test results, encounter notes, upcoming appointments, etc.  Non-urgent messages can be sent to your provider as well.   To learn more about what you can do with MyChart, go to [email or web address].    Your next appointment:   8 month(s)  The format for your next appointment:   In Person  Provider:   Laurance Flatten, MD   Other Instructions    Orthostatic Hypotension Blood pressure is a measurement of how strongly, or weakly, your blood is pressing against the walls of your arteries. Orthostatic hypotension is a sudden drop in blood pressure that happens when you quickly change positions, such as when you get up from sitting or lying down. Arteries are blood vessels that carry blood from  your heart throughout your body. When blood pressure is too low, you may not get enough blood to your brain or to the rest of your organs. This can cause weakness, light-headedness, rapid heartbeat, and fainting. This can last for just a few seconds or for up to a few minutes. Orthostatic hypotension is usually not a serious problem. However, if it happens frequently or gets worse, it may be a sign of something more serious. What are the causes? This condition may be caused by:  Sudden changes in posture, such as standing up quickly after you have been sitting or lying down.  Blood loss.  Loss of body fluids (dehydration).  Heart problems.  Hormone (endocrine) problems.  Pregnancy.  Severe infection.  Lack of certain nutrients.  Severe allergic reactions (anaphylaxis).  Certain medicines, such as blood pressure medicine or medicines that make the body lose excess fluids (diuretics). Sometimes, this condition can be caused by not taking medicine as directed, such as taking too much of a certain medicine. What increases the risk? The following factors may make you more likely to develop this condition:  Age. Risk increases as you get older.  Conditions that affect the heart or the central nervous system.  Taking certain medicines, such as blood pressure medicine or diuretics.  Being pregnant. What are the signs or symptoms? Symptoms of this condition may include:  Weakness.  Light-headedness.  Dizziness.  Blurred vision.  Fatigue.  Rapid heartbeat.  Fainting, in severe cases. How is this diagnosed? This condition is diagnosed based on:  Your medical history.  Your symptoms.  Your blood pressure measurement. Your health care provider will check your blood pressure when you are: ? Lying down. ? Sitting. ? Standing. A blood pressure reading is recorded as two numbers, such as "120 over 80" (or 120/80). The first ("top") number is called the systolic pressure. It  is a measure of the pressure in your arteries as your heart beats. The second ("bottom") number is called the diastolic pressure. It is a measure of the pressure in your arteries when your heart relaxes between beats. Blood pressure is measured in a  unit called mm Hg. Healthy blood pressure for most adults is 120/80. If your blood pressure is below 90/60, you may be diagnosed with hypotension. Other information or tests that may be used to diagnose orthostatic hypotension include:  Your other vital signs, such as your heart rate and temperature.  Blood tests.  Tilt table test. For this test, you will be safely secured to a table that moves you from a lying position to an upright position. Your heart rhythm and blood pressure will be monitored during the test. How is this treated? This condition may be treated by:  Changing your diet. This may involve eating more salt (sodium) or drinking more water.  Taking medicines to raise your blood pressure.  Changing the dosage of certain medicines you are taking that might be lowering your blood pressure.  Wearing compression stockings. These stockings help to prevent blood clots and reduce swelling in your legs. In some cases, you may need to go to the hospital for:  Fluid replacement. This means you will receive fluids through an IV.  Blood replacement. This means you will receive donated blood through an IV (transfusion).  Treating an infection or heart problems, if this applies.  Monitoring. You may need to be monitored while medicines that you are taking wear off. Follow these instructions at home: Eating and drinking  Drink enough fluid to keep your urine pale yellow.  Eat a healthy diet, and follow instructions from your health care provider about eating or drinking restrictions. A healthy diet includes: ? Fresh fruits and vegetables. ? Whole grains. ? Lean meats. ? Low-fat dairy products.  Eat extra salt only as directed. Do not  add extra salt to your diet unless your health care provider told you to do that.  Eat frequent, small meals.  Avoid standing up suddenly after eating.   Medicines  Take over-the-counter and prescription medicines only as told by your health care provider. ? Follow instructions from your health care provider about changing the dosage of your current medicines, if this applies. ? Do not stop or adjust any of your medicines on your own. General instructions  Wear compression stockings as told by your health care provider.  Get up slowly from lying down or sitting positions. This gives your blood pressure a chance to adjust.  Avoid hot showers and excessive heat as directed by your health care provider.  Return to your normal activities as told by your health care provider. Ask your health care provider what activities are safe for you.  Do not use any products that contain nicotine or tobacco, such as cigarettes, e-cigarettes, and chewing tobacco. If you need help quitting, ask your health care provider.  Keep all follow-up visits as told by your health care provider. This is important.   Contact a health care provider if you:  Vomit.  Have diarrhea.  Have a fever for more than 2-3 days.  Feel more thirsty than usual.  Feel weak and tired. Get help right away if you:  Have chest pain.  Have a fast or irregular heartbeat.  Develop numbness in any part of your body.  Cannot move your arms or your legs.  Have trouble speaking.  Become sweaty or feel light-headed.  Faint.  Feel short of breath.  Have trouble staying awake.  Feel confused. Summary  Orthostatic hypotension is a sudden drop in blood pressure that happens when you quickly change positions.  Orthostatic hypotension is usually not a serious problem.  It is  diagnosed by having your blood pressure taken lying down, sitting, and then standing.  It may be treated by changing your diet or adjusting your  medicines. This information is not intended to replace advice given to you by your health care provider. Make sure you discuss any questions you have with your health care provider. Document Revised: 05/09/2018 Document Reviewed: 05/09/2018 Elsevier Patient Education  2021 Elsevier Inc.      I,Mathew Stumpf,acting as a Neurosurgeon for Meriam Sprague, MD.,have documented all relevant documentation on the behalf of Meriam Sprague, MD,as directed by  Meriam Sprague, MD while in the presence of Meriam Sprague, MD.  ***  Signed, Meriam Sprague, MD  04/18/2021 5:21 PM    [facility]

## 2021-04-19 ENCOUNTER — Ambulatory Visit: Payer: Medicaid Other

## 2021-04-19 ENCOUNTER — Ambulatory Visit (INDEPENDENT_AMBULATORY_CARE_PROVIDER_SITE_OTHER): Payer: Medicaid Other

## 2021-04-19 ENCOUNTER — Telehealth: Payer: Self-pay | Admitting: *Deleted

## 2021-04-19 DIAGNOSIS — R002 Palpitations: Secondary | ICD-10-CM

## 2021-04-19 NOTE — Progress Notes (Unsigned)
Patient enrolled for [employer] to mail a 14 day ZIO XT monitor to her home. 

## 2021-04-19 NOTE — Telephone Encounter (Signed)
-----   Message from Ernst Bowler sent at 04/19/2021  8:12 AM EDT ----- Regarding: RE: 14 DAY ZIO PER PEMBERTON Done  ----- Message ----- From: Loa Socks, LPN Sent: 1/54/0086   4:24 PM EDT To: Ernst Bowler, Katrina Carmell [place] Subject: 14 DAY ZIO PER PEMBERTON                       Per Dr. Shari Prows she needs 14 day zio for palpitations.  Order is in and need you to enroll. She speaks Dari.  Can you please enroll and let me know?  Thanks, [employer]

## 2021-05-06 ENCOUNTER — Telehealth: Payer: Self-pay | Admitting: *Deleted

## 2021-05-06 DIAGNOSIS — N926 Irregular menstruation, unspecified: Secondary | ICD-10-CM

## 2021-05-06 NOTE — Telephone Encounter (Signed)
New referral for the pt to see OBGYN at [facility], for they take her insurance Medicaid.  [facility] Scheduler Omar Person will be coordinating this referral for the pt.

## 2021-05-11 NOTE — Congregational Nurse Program (Signed)
Patient had gotten burned while cooking on right anterior forearm. Middle of burned area was not yet scabbed over. Educated patient to keep burn clean and covered, use neosporin to prevent infection. Patient will return for skin check next Tuesday. Instructed patient to seek help with PCP if burn became more painful or red before then. Patient verbalized understanding.   Arman Bogus RN BSn PCCN  [facility] [phone]-cell [phone]-office

## 2021-05-17 DIAGNOSIS — R002 Palpitations: Secondary | ICD-10-CM | POA: Diagnosis not present

## 2021-05-31 NOTE — Congregational Nurse Program (Signed)
  Dept: [phone]   Congregational Nurse Program Note  Date of Encounter: 05/31/2021  Past Medical History: Past Medical History:  Diagnosis Date   GERD (gastroesophageal reflux disease)     Encounter Details:  Patient came with c/o rash left wrist. Rash looks similar to rash on her upper back. Previously prescribed miconazole 2% for similar rash on her back.She has Miconazole at home. Advised to use it for the rash.  Arman Bogus RN BSn PCCN  [facility] [phone]-cell [phone]-office

## 2021-06-01 ENCOUNTER — Ambulatory Visit (INDEPENDENT_AMBULATORY_CARE_PROVIDER_SITE_OTHER): Payer: Medicaid Other | Admitting: Women's Health

## 2021-06-01 ENCOUNTER — Encounter: Payer: Self-pay | Admitting: Women's Health

## 2021-06-01 ENCOUNTER — Other Ambulatory Visit: Payer: Self-pay

## 2021-06-01 VITALS — BP 118/66 | HR 82 | Ht 64.0 in | Wt 124.6 lb

## 2021-06-01 DIAGNOSIS — Z3009 Encounter for other general counseling and advice on contraception: Secondary | ICD-10-CM | POA: Diagnosis not present

## 2021-06-01 DIAGNOSIS — R3989 Other symptoms and signs involving the genitourinary system: Secondary | ICD-10-CM

## 2021-06-01 LAB — POCT URINALYSIS DIPSTICK
Blood, UA: NEGATIVE
Nitrite, UA: NEGATIVE

## 2021-06-01 MED ORDER — NORETHIN ACE-ETH ESTRAD-FE 1-20 MG-MCG PO TABS: 1.0000 | ORAL_TABLET | Freq: Every day | ORAL | 11 refills | Status: DC

## 2021-06-01 NOTE — Patient Instructions (Signed)
When using your birth control, if you experience any of the following, please call the office or report to the nearest emergency room immediately: -severe abdominal pain/weakness -chest pain/shortness of breath -the worst HA you have ever had in your life -sudden changes in vision -difficulty speaking -severe leg pain/redness/swelling Please also refer to the additional information you were given in the office today while using your birth control.       Norethindrone Acetate; Ethinyl Estradiol; Ferrous Fumarate Capsules or Tablets What is this medication? NORETHINDRONE ACETATE; ETHINYL ESTRADIOL; FERROUS FUMARATE (nor eth IN drone AS e tate; ETH in il es tra DYE ole; FER [place] FUE ma rate) prevents ovulation and pregnancy. It may also be used to treat acne. It belongs to a group of medications called oral contraceptives. It is a combination of the hormonesestrogen and progestin. This medicine may be used for other purposes; ask your health care provider orpharmacist if you have questions. COMMON BRAND NAME(S): Aurovela [address] 1/20, [address], [place] [address], [address] [place], [place], [place], [address], [address] Fe 1.5/30, [place] Fe 1/20, Hailey 24 Fe, Hailey Fe 1.5/30, Junel Fe 1.5/30, Junel Fe 1/20, [place]el Fe 24, [place], Lo Loestrin Fe, Loestrin 24 Fe, Loestrin FE 1.5/30, Loestrin FE 1/20, Lomedia 24 Fe, [place], Microgestin 24 Fe, Microgestin Fe 1.5/30, Microgestin Fe1/20, Tarina 24 Fe, Tarina Fe 1/20, Taysofy, Taytulla, Tilia Fe, Tri-Legest Fe What should I tell my care team before I take this medication? They need to know if you have any of these conditions: Abnormal vaginal bleeding Blood vessel disease or blood clots Breast, cervical, endometrial, ovarian, liver, or uterine cancer Diabetes Gallbladder disease Having surgery Heart disease or recent heart attack High blood pressure High cholesterol or triglycerides History of irregular heartbeat or heart valve problems Kidney  disease Liver disease Migraine headaches Protein C deficiency Protein S deficiency Recently had a baby, miscarriage, or abortion Stroke Systemic lupus erythematosus (SLE) Tobacco smoker An unusual or allergic reaction to estrogens, progestins, other medications, foods, dyes, or preservatives Pregnant or trying to get pregnant Breast-feeding How should I use this medication? Take this medication by mouth. To reduce nausea, this medication may be taken with food. Follow the directions on the prescription label. Take this medication at the same time each day and in the order directed on the package.Do not take your medication more often than directed. A patient package insert for the product will be given with each prescription and refill. Read this sheet carefully each time. The sheet may changefrequently. Contact your care team regarding the use of this medication in children. Special care may be needed. This medication has been used in female childrenwho have started having menstrual periods. Overdosage: If you think you have taken too much of this medicine contact apoison control center or emergency room at once. NOTE: This medicine is only for you. Do not share this medicine with others. What if I miss a dose? If you miss a dose, refer to the patient information sheet you received with your medication for direction. If you miss more than one pill, this medicationmay not be as effective, and you may need to use another form of birth control. What may interact with this medication? Do not take this medication with the following: Dasabuvir; ombitasvir; paritaprevir; ritonavir Ombitasvir; paritaprevir; ritonavir This medication may also interact with the following: Acetaminophen Antibiotics or medications for infections, especially rifampin, rifabutin, rifapentine, and griseofulvin, and possibly penicillins or tetracyclines Aprepitant Ascorbic acid (vitamin C) Atorvastatin Barbiturate  medications, such as phenobarbital Bosentan Carbamazepine Caffeine Clofibrate Cyclosporine Dantrolene Doxercalciferol Felbamate Grapefruit juice Hydrocortisone Medications for anxiety or sleeping problems, such as diazepam or temazepam Medications for diabetes, including pioglitazone Mineral oil Modafinil Mycophenolate Nefazodone Oxcarbazepine Phenytoin Prednisolone Ritonavir or other medications for HIV infection or AIDS Rosuvastatin Selegiline Soy isoflavones supplements St. John's wort Tamoxifen or raloxifene Theophylline Thyroid hormones Topiramate Warfarin This list may not describe all possible interactions. Give your health care provider a list of all the medicines, herbs, non-prescription drugs, or dietary supplements you use. Also tell them if you smoke, drink alcohol, or use illegaldrugs. Some items may interact with your medicine. What should I watch for while using this medication? Visit your care team for regular checks on your progress. You will need aregular breast and pelvic exam and Pap smear while on this medication. Use an additional method of contraception during the first cycle that you takethese tablets. If you have any reason to think you are pregnant, stop taking this medicationright away and contact your care team. If you are taking this medication for hormone related problems, it may takeseveral cycles of use to see improvement in your condition. Smoking increases the risk of getting a blood clot or having a stroke while you are taking birth control pills, especially if you are more than 20 years old.You are strongly advised not to smoke. This medication can make your body retain fluid, making your fingers, hands, or ankles swell. Your blood pressure can go up. Contact your care team if you feelyou are retaining fluid. This medication can make you more sensitive to the sun. Keep out of the sun. If you cannot avoid being in the sun, wear protective  clothing and use sunscreen.Do not use sun lamps or tanning beds/booths. If you wear contact lenses and notice visual changes, or if the lenses begin tofeel uncomfortable, consult your eye care specialist. In some women, tenderness, swelling, or minor bleeding of the gums may occur. Notify your dentist if this happens. Brushing and flossing your teeth regularly may help limit this. See your dentist regularly and inform your dentist of themedications you are taking. If you are going to have elective surgery, you may need to stop taking thismedication before the surgery. Consult your care team for advice. This medication does not protect you against HIV infection (AIDS) or any othersexually transmitted diseases. What side effects may I notice from receiving this medication? Side effects that you should report to your care team as soon as possible: Allergic reactions-skin rash, itching, hives, swelling of the face, lips, tongue, or throat Blood clot-pain, swelling, or warmth in the leg, shortness of breath, chest pain Gallbladder problems-severe stomach pain, nausea, vomiting, fever Increase in blood pressure Liver injury-right upper belly pain, loss of appetite, light-colored stool, dark yellow or brown urine, yellowing skin or eyes, unusual weakness or fatigue New or worsening migraines or headaches Stroke-sudden numbness or weakness of the face, arm or leg, trouble speaking, confusion, trouble walking, loss of balance or coordination, dizziness, severe headache, change in vision Unusual vaginal discharge, itching, or odor Worsening mood, feelings of depression Side effects that usually do not require medical attention (report these toyour care team if they continue or are bothersome): Breast pain or tenderness Dark patches of skin on the face or other sun-exposed areas Irregular menstrual cycles or spotting Nausea Weight gain This list may not describe all possible side effects. Call your doctor  for medical advice about side effects. You may report side effects to FDA  at1-800-FDA-1088. Where should I keep my medication? Keep out of the reach of children and pets. Store at room temperature between 15 and 30 degrees C (59 and 86 degrees F).Throw away any unused medication after the expiration date. NOTE: This sheet is a summary. It may not cover all possible information. If you have questions about this medicine, talk to your doctor, pharmacist, orhealth care provider.  2022 Elsevier/Gold Standard (2020-10-04 12:27:45)

## 2021-06-01 NOTE — Progress Notes (Signed)
  History:  Bethany Harris is a 20 y.o. G0P0000 who presents to clinic today for heavy menstrual bleeding. Patient reports her periods are monthly, they last 5-6 days, she uses 4 pads on her heaviest day, and her cramping is very intense. Patient would like to help resolve the cramping. Patient reports she was put on Tri-Lo-Sprintec by another provider, which made the bleeding heavier and cramping worse. Patient reports NKDA and denies and PMH. Patient is not currently taking any medications other than Kenalog and miconzaole topical cream for a rash.  Patient also reports she experienced bladder pain one day this week and would like to be checked for a UTI.  Patient also requests an exam of her external genitalia to ensure they are normal for cultural purposes regarding marriage.  Dari interpreter used for entire visit.  The following portions of the patient's history were reviewed and updated as appropriate: allergies, current medications, family history, past medical history, social history, past surgical history and problem list.  Review of Systems:  Review of Systems  All other systems reviewed and are negative.   Objective:  Physical Exam BP 118/66   Pulse 82   Ht 5\' 4"  (1.626 m)   Wt 124 lb 9.6 oz (56.5 kg)   BMI 21.39 kg/m   Physical Exam Vitals and nursing note reviewed.  Constitutional:      General: She is not in acute distress.    Appearance: Normal appearance. She is not ill-appearing, toxic-appearing or diaphoretic.  HENT:     Head: Normocephalic and atraumatic.  Pulmonary:     Effort: Pulmonary effort is normal.  Neurological:     Mental Status: She is alert and oriented to person, place, and time.  Psychiatric:        Mood and Affect: Mood normal.        Behavior: Behavior normal.        Thought Content: Thought content normal.        Judgment: Judgment normal.   Labs and Imaging No results found for this or any previous visit (from the past 24  hour(s)).  No results found.  Health Maintenance Due  Topic Date Due   HPV VACCINES (1 - 2-dose series) Never done   Assessment & Plan:  1. Encounter for general counseling on prescription of oral contraceptives - norethindrone-ethinyl estradiol-FE (JUNEL FE 1/20) 1-20 MG-MCG tablet; Take 1 tablet by mouth daily.  Dispense: 28 tablet; Refill: 11  2. Bladder pain - POCT Urinalysis Dipstick  Approximately 10 minutes of total time was spent with this patient on counseling and exam.  2/20, NP 06/01/2021 3:17 PM

## 2021-06-03 LAB — URINE CULTURE

## 2021-06-05 NOTE — Progress Notes (Signed)
Please call patient to notify of normal urine culture results. Patient needs Dari/Farsi/Persian interpreter.  Thank you, Joni Reining

## 2021-06-08 ENCOUNTER — Other Ambulatory Visit: Payer: Self-pay | Admitting: *Deleted

## 2021-06-08 DIAGNOSIS — R002 Palpitations: Secondary | ICD-10-CM

## 2021-06-08 NOTE — Progress Notes (Unsigned)
Redo 14 day ZIO XT long term holter monitor.  First monitor fell off in 24 hours.

## 2021-06-13 ENCOUNTER — Ambulatory Visit (HOSPITAL_COMMUNITY): Payer: Medicaid Other | Admitting: Clinical

## 2021-07-01 ENCOUNTER — Other Ambulatory Visit: Payer: Self-pay

## 2021-07-01 ENCOUNTER — Emergency Department (HOSPITAL_COMMUNITY)
Admission: EM | Admit: 2021-07-01 | Discharge: 2021-07-02 | Disposition: A | Discharge status: Home / Self Care | Payer: Medicaid Other | Attending: Emergency Medicine | Admitting: Emergency Medicine

## 2021-07-01 DIAGNOSIS — R102 Pelvic and perineal pain: Secondary | ICD-10-CM | POA: Insufficient documentation

## 2021-07-01 DIAGNOSIS — R1030 Lower abdominal pain, unspecified: Secondary | ICD-10-CM | POA: Diagnosis not present

## 2021-07-01 DIAGNOSIS — Z5321 Procedure and treatment not carried out due to patient leaving prior to being seen by health care provider: Secondary | ICD-10-CM | POA: Insufficient documentation

## 2021-07-01 LAB — I-STAT BETA HCG BLOOD, ED (MC, WL, AP ONLY): I-stat hCG, quantitative: <5 m[IU]/mL (ref <5)

## 2021-07-01 NOTE — ED Triage Notes (Signed)
Pt is on her period - having lower abdominal pain - also states her period came 4 days early. Denies passing any clots.

## 2021-07-02 LAB — CBC
HCT: 37.1 % (ref 36.0–46.0)
Hemoglobin: 12.5 g/dL (ref 12.0–15.0)
MCH: 28.3 pg (ref 26.0–34.0)
MCHC: 33.7 g/dL (ref 30.0–36.0)
MCV: 83.9 fL (ref 80.0–100.0)
Platelets: 183 10*3/uL (ref 150–400)
RBC: 4.42 MIL/uL (ref 3.87–5.11)
RDW: 11.9 % (ref 11.5–15.5)
WBC: 8.2 10*3/uL (ref 4.0–10.5)
nRBC: 0 % (ref 0.0–0.2)

## 2021-07-02 LAB — COMPREHENSIVE METABOLIC PANEL
ALT: 14 U/L (ref 0–44)
AST: 18 U/L (ref 15–41)
Albumin: 4.1 g/dL (ref 3.5–5.0)
Alkaline Phosphatase: 66 U/L (ref 38–126)
Anion gap: 6 (ref 5–15)
BUN: 14 mg/dL (ref 6–20)
CO2: 24 mmol/L (ref 22–32)
Calcium: 9 mg/dL (ref 8.9–10.3)
Chloride: 108 mmol/L (ref 98–111)
Creatinine, Ser: 0.69 mg/dL (ref 0.44–1.00)
GFR, Estimated: >60 mL/min (ref >60)
Glucose, Bld: 128 mg/dL — ABNORMAL HIGH (ref 70–99)
Potassium: 3.6 mmol/L (ref 3.5–5.1)
Sodium: 138 mmol/L (ref 135–145)
Total Bilirubin: 0.6 mg/dL (ref 0.3–1.2)
Total Protein: 6.5 g/dL (ref 6.5–8.1)

## 2021-07-02 MED ORDER — ACETAMINOPHEN 325 MG PO TABS
650.0000 mg | ORAL_TABLET | Freq: Once | ORAL | Status: DC
  Filled 2021-07-02: qty 2

## 2021-07-02 NOTE — ED Notes (Signed)
Patients father states she is feeling better and he wishes to take her home now. States if the pain gets worse he will bring her back

## 2021-07-13 ENCOUNTER — Ambulatory Visit: Payer: Medicaid Other | Admitting: Women's Health

## 2021-07-13 ENCOUNTER — Other Ambulatory Visit: Payer: Self-pay

## 2021-07-13 NOTE — Progress Notes (Signed)
Pt states she has had problems with cycle, pain and nausea.  States cycle has been heavy, 5-6 days.

## 2021-07-13 NOTE — Progress Notes (Signed)
Pt here as she was not aware she had a year's worth of refills on her OCP and thought she needed to come to office to obtain new RX. Clarified process and refills with patient using Armed forces training and education officer, pt verbalizes undrestanding.  Marylen Ponto, NP  2:24 PM 07/13/2021

## 2021-07-14 ENCOUNTER — Other Ambulatory Visit (HOSPITAL_COMMUNITY): Payer: Self-pay | Admitting: Psychiatry

## 2021-07-14 ENCOUNTER — Encounter: Payer: Self-pay | Admitting: Adult Health

## 2021-07-14 ENCOUNTER — Encounter (HOSPITAL_COMMUNITY): Payer: Medicaid Other | Admitting: Psychiatry

## 2021-07-14 ENCOUNTER — Ambulatory Visit (INDEPENDENT_AMBULATORY_CARE_PROVIDER_SITE_OTHER): Payer: Medicaid Other | Admitting: Adult Health

## 2021-07-14 ENCOUNTER — Telehealth (HOSPITAL_COMMUNITY): Payer: Self-pay | Admitting: Psychiatry

## 2021-07-14 VITALS — BP 100/62 | HR 92 | Ht 64.0 in | Wt 127.0 lb

## 2021-07-14 DIAGNOSIS — F32A Depression, unspecified: Secondary | ICD-10-CM

## 2021-07-14 DIAGNOSIS — R55 Syncope and collapse: Secondary | ICD-10-CM

## 2021-07-14 DIAGNOSIS — F320 Major depressive disorder, single episode, mild: Secondary | ICD-10-CM

## 2021-07-14 DIAGNOSIS — L659 Nonscarring hair loss, unspecified: Secondary | ICD-10-CM

## 2021-07-14 DIAGNOSIS — G43109 Migraine with aura, not intractable, without status migrainosus: Secondary | ICD-10-CM

## 2021-07-14 DIAGNOSIS — F411 Generalized anxiety disorder: Secondary | ICD-10-CM

## 2021-07-14 MED ORDER — DICLOFENAC SODIUM 75 MG PO TBEC: 75.0000 mg | DELAYED_RELEASE_TABLET | Freq: Every day | ORAL | 5 refills | Status: DC | PRN

## 2021-07-14 MED ORDER — AMITRIPTYLINE HCL 50 MG PO TABS: 50.0000 mg | ORAL_TABLET | Freq: Every day | ORAL | 3 refills | Status: DC

## 2021-07-14 NOTE — Patient Instructions (Addendum)
Recommend trying diclofenac 75 mg as needed at migraine onset. May repeat x1 after 6 hours if needed  Recommend speaking to your OB/GYN (gynecologist) regarding use of estrogen birth control to due what sounds like aura migraines as this can increase risk of blood clotting  Use of amitriptyline may potentially be contributing to your hair loss concerns. Prior blood work completed by your primary doctor looked good. Would recommend speaking to your psychiatrist or primary care doctor about potentially changing amitriptyline to a different type of medication    Follow-up in 4 months or call earlier if needed    Other things to do at home to help migraine headaches: Cool Compress. Lie down and place a cool compress on your head.   Avoid headache triggers. If certain foods or odors seem to have triggered your migraines in the past, avoid them. A headache diary might help you identify triggers.   Include physical activity in your daily routine.  Manage stress. Find healthy ways to cope with the stressors, such as delegating tasks on your to-do list.   Practice relaxation techniques. Try deep breathing, yoga, massage and visualization.   Eat regularly. Eating regularly scheduled meals and maintaining a healthy diet might help prevent headaches. Also, drink plenty of fluids.   Follow a regular sleep schedule. Sleep deprivation might contribute to headaches Consider biofeedback. With this mind-body technique, you learn to control certain bodily functions -- such as muscle tension, heart rate and blood pressure -- to prevent headaches or reduce headache pain.

## 2021-07-14 NOTE — Progress Notes (Addendum)
[facility] [address] [place]. [place] [address] [phone]       OFFICE FOLLOW UP NOTE  [name] Date of Birth:  26-Jul-2001 Medical Record Number:  [id number]   Referring MD: Bethany Harris  Reason for Referral: Syncope, headache  Chief Complaint  Patient presents with   Follow-up    Rm 2 here for 6 month f/u Reports she has been doing well since her last visit. Headaches have improved       HPI:   Bethany Harris is a 20 year old female with PMH of syncopal events, headaches, insomnia and anxiety.  She was previously seen by Bethany Harris on 03/21/2021 and initial visit with Bethany Harris on 01/18/2021 for recurrent episodes of passing out over the past 6 months.  Per Bethany Harris "These episodes are quite stereotypical. The patient complains of a headache before the episode and then states she wants to lie down.  She is quite unresponsive and even when water is splashed on her face she does not respond.  Episodes have lasted about 10 minutes to about an hour or 2". Of note, mother had similar symptoms and passed away from ruptured aneurysm.  MR brain and MRA head/neck unremarkable. EEG negative.  Cardiology recommended cardiac monitor for episodes of dizziness associated with severe headache, nausea and palpitations.  8 day monitor completed 05/2021 which was negative for any significant arrhythmias. She has establish care with OB/GYN for irregular and heavy menses recently started on estrogen birth control that she reports was just started Sunday 8/14.she continues to follow with psychiatry for anxiety.  She was prescribed amitriptyline in 11/2020 for headache, anxiety and insomnia during evaluation for syncopal event.  Returns today,07/14/2021, for routine follow up.  Reports she has been doing well since prior visit 4 months ago.  She has not had any additional syncopal events.  Reports improvement of headaches currently occurring approx 1x weekly.  Intermittent use of  amitriptyline -currently prescribed 50 mg nightly but will only take at night if she has a headache.  Headache will typically start in the morning and lasts all day but after taking amitriptyline prior to bed, headache will be gone in the morning.  These are typically located on right side with throbbing sensation associated with photophobia and phonophobia and occasionally with dizziness and nausea.  Prior to headache onset, reports vision changes with both positive and negative symptoms.  Last migraine headache on Tuesday (8/14).  Reports over the past 7 months, noticed her hair falling out more. Denies actual bald spots. Does report dry, flaky scalp.  No further concerns at this time.      ROS:   14 system review of systems is positive for those listed in HPI all other systems negative  PMH:  Past Medical History:  Diagnosis Date   GERD (gastroesophageal reflux disease)     Social History:  Social History   Socioeconomic History   Marital status: Single    Spouse name: Not on file   Number of children: Not on file   Years of education: Not on file   Highest education level: Not on file  Occupational History   Occupation: Student  Tobacco Use   Smoking status: Never   Smokeless tobacco: Never  Vaping Use   Vaping Use: Never used  Substance and Sexual Activity   Alcohol use: Never   Drug use: Never   Sexual activity: Never  Other Topics Concern   Not on file  Social History  Narrative   Lives with dad and 6 other siblings   Right handed   Drinks caffeine rarely   Social Determinants of Health   Financial Resource Strain: Not on file  Food Insecurity: Not on file  Transportation Needs: Not on file  Physical Activity: Not on file  Stress: Not on file  Social Connections: Not on file  Intimate Partner Violence: Not on file    Medications:   Current Outpatient Medications on File Prior to Visit  Medication Sig Dispense Refill   ALPRAZolam (XANAX) 0.25 MG tablet  Take 0.25 mg by mouth at bedtime as needed for anxiety.     amitriptyline (ELAVIL) 50 MG tablet Take 1 tablet (50 mg total) by mouth at bedtime. 30 tablet 2   No current facility-administered medications on file prior to visit.    Allergies:  No Known Allergies  Physical Exam Today's Vitals   07/14/21 0930  BP: 100/62  Pulse: 92  Weight: 127 lb (57.6 kg)  Height: 5\' 4"  (1.626 m)   Body mass index is 21.8 kg/m.  General: well developed, well nourished very pleasant young African girl, seated, in no evident distress Head: head normocephalic and atraumatic.   Neck: supple with no carotid or supraclavicular bruits Cardiovascular: regular rate and rhythm, no murmurs Musculoskeletal: no deformity Skin:  no rash/petichiae Vascular:  Normal pulses all extremities  Neurologic Exam limited due to language barrier Mental Status: Awake and fully alert. Oriented to place and time. Recent and remote memory intact. Attention span, concentration and fund of knowledge appropriate. Mood and affect appropriate.  Cranial Nerves: Pupils equal, briskly reactive to light. Extraocular movements full without nystagmus. Visual fields full to confrontation. Hearing intact. Facial sensation intact. Face, tongue, palate moves normally and symmetrically.  Motor: Normal bulk and tone. Normal strength in all tested extremity muscles. Sensory.: intact to touch , pinprick , position and vibratory sensation.  Coordination: Rapid alternating movements normal in all extremities. Finger-to-nose and heel-to-shin performed accurately bilaterally. Gait and Station: Arises from chair without difficulty. Stance is normal. Gait demonstrates normal stride length and balance . Able to heel, toe and tandem walk without difficulty.  Reflexes: 1+ and symmetric. Toes downgoing.       ASSESSMENT/PLAN: 20 year old African girl with recurrent episodes of transient loss of consciousness and unresponsiveness of unclear etiology.   Possibly nonepileptic spells triggered by stress.  Extensive work-up neurologically and cardiac unremarkable.  Also ongoing headaches with reported symptoms consistent with migraine with aura    Syncope No recent events Cardiac monitor negative MR brain and MRA head/neck unremarkable EEG negative Unknown etiology - continue to monitor Migraine with aura Start diclofenac 75 mg as needed for acute migraine relief Hesitant to trial triptans due to possible side effects with already migraine associated symptoms of dizziness and nausea MR brain unremarkable Advised to discuss use of estrogen birth control with OB/GYN as this can increase risk of blood clots in a patient with aura migraines Alopecia Present over the past 7 months gradually worsening TSH and iron/ferritin levels 01/03/2021 within normal limits Possibly due to use of amitriptyline -advised to discuss further with PCP and psychiatry for other alternatives. May need referral to derm - defer to PCP    CC:  [facility] provider: Dr. 03/03/2021, Azucena Fallen, NP   I spent 35 minutes of face-to-face and non-face-to-face time with patient.  This included previsit chart review, lab review, study review, order entry, electronic health record documentation, patient education and discussion regarding prior syncopal events,  migraine with aura and further treatment options, medication use and potential side effects, alopecia and possible contributing factors and answered all other questions to patient's satisfaction  Ihor Austin, AGNP-BC  [facility] [address] [place], [place] [address]  Phone [phone] Fax [phone] Note: This document was prepared with digital dictation and possible smart phrase technology. Any transcriptional errors that result from this process are unintentional.

## 2021-07-14 NOTE — Telephone Encounter (Signed)
Patient did not show for appt at scheduled time today.  Pt reports with assistance of interpreter, that she picked up her prescription .   Scheduled appt for 09/28/21 with Doyne Keel. Appt confirmed with interpreter assistance. Please send any additional prescriptions with enough refills until her appt 09/28/21.

## 2021-07-14 NOTE — Telephone Encounter (Signed)
Medication refilled and sent to preferred pharmacy

## 2021-07-18 ENCOUNTER — Telehealth: Payer: Self-pay | Admitting: *Deleted

## 2021-07-18 NOTE — Telephone Encounter (Signed)
Diclofenac Na PA form from [facility] pharmacy placed on NP's desk for review, signature.

## 2021-07-19 NOTE — Progress Notes (Signed)
I agree with the above plan 

## 2021-07-19 NOTE — Telephone Encounter (Signed)
Faxed diclofenac Na PA to [facility], received confirmation.

## 2021-07-20 ENCOUNTER — Other Ambulatory Visit: Payer: Self-pay

## 2021-07-20 ENCOUNTER — Ambulatory Visit (INDEPENDENT_AMBULATORY_CARE_PROVIDER_SITE_OTHER): Payer: Medicaid Other | Admitting: Nurse Practitioner

## 2021-07-20 ENCOUNTER — Encounter: Payer: Self-pay | Admitting: Nurse Practitioner

## 2021-07-20 VITALS — BP 97/63 | HR 107 | Temp 97.5°F | Ht 63.0 in | Wt 126.0 lb

## 2021-07-20 DIAGNOSIS — H9202 Otalgia, left ear: Secondary | ICD-10-CM

## 2021-07-20 DIAGNOSIS — H9192 Unspecified hearing loss, left ear: Secondary | ICD-10-CM | POA: Diagnosis not present

## 2021-07-20 DIAGNOSIS — H9313 Tinnitus, bilateral: Secondary | ICD-10-CM

## 2021-07-20 DIAGNOSIS — H6123 Impacted cerumen, bilateral: Secondary | ICD-10-CM

## 2021-07-20 NOTE — Patient Instructions (Signed)
You were seen today for your ear pain and decreased hearing. We completed an ear irrigation in office, which resolved your symptoms. Please follow up as needed

## 2021-07-20 NOTE — Progress Notes (Signed)
[facility] Patient [facility] [address] Anastasia Pall [place], [place]  [address] Phone:  [phone]   Fax:  [phone] Subjective:   Patient ID: Bethany Harris, female    DOB: [birth year], 20 y.o.   MRN: [id number]  Chief Complaint  Patient presents with   Acute Visit    Left ear pain, started beginning of month. Thinks cake got into her ear from a birthday party     Bethany Harris 20 y.o. female presents with   Otalgia  There is pain in the left ear. This is a new problem. The current episode started 1 to 4 weeks ago. The problem occurs constantly. The problem has been unchanged. There has been no fever. Pain scale: No pain during visit, but when it occurs it is a 8-9/10. Associated symptoms include hearing loss. Pertinent negatives include no abdominal pain, coughing, diarrhea, ear discharge, headaches or vomiting. She has tried NSAIDs (allergy medication) for the symptoms. The treatment provided no relief. There is no history of a chronic ear infection or a tympanostomy tube.   Patient states that symptoms began after a birthday party where the children got cake in her ear. She endorses ringing in left ear and decreased hearing. Describes pain as throbbing when it occurs. Denies any drainage from ears.States that she has only taken allergy medication once for symptoms due to sedative effect.  Past Medical History:  Diagnosis Date   GERD (gastroesophageal reflux disease)     No past surgical history on file.  No family history on file.  Social History   Socioeconomic History   Marital status: Single    Spouse name: Not on file   Number of children: Not on file   Years of education: Not on file   Highest education level: Not on file  Occupational History   Occupation: Student  Tobacco Use   Smoking status: Never   Smokeless tobacco: Never  Vaping Use   Vaping Use: Never used  Substance and Sexual Activity   Alcohol use: Never   Drug use: Never   Sexual activity: Never   Other Topics Concern   Not on file  Social History Narrative   Lives with dad and 6 other siblings   Right handed   Drinks caffeine rarely   Social Determinants of Health   Financial Resource Strain: Not on file  Food Insecurity: Not on file  Transportation Needs: Not on file  Physical Activity: Not on file  Stress: Not on file  Social Connections: Not on file  Intimate Partner Violence: Not on file    Outpatient Medications Prior to Visit  Medication Sig Dispense Refill   amitriptyline (ELAVIL) 50 MG tablet Take 1 tablet (50 mg total) by mouth at bedtime. 30 tablet 3   ALPRAZolam (XANAX) 0.25 MG tablet Take 0.25 mg by mouth at bedtime as needed for anxiety. (Patient not taking: Reported on 07/20/2021)     diclofenac (VOLTAREN) 75 MG EC tablet Take 1 tablet (75 mg total) by mouth daily as needed (For emergent migraine). Take at migraine onset (Patient not taking: Reported on 07/20/2021) 30 tablet 5   No facility-administered medications prior to visit.    No Known Allergies  Review of Systems  Constitutional:  Negative for chills, fever and malaise/fatigue.  HENT:  Positive for ear pain, hearing loss and tinnitus. Negative for ear discharge.   Respiratory:  Negative for cough and shortness of breath.   Cardiovascular:  Negative for chest pain, palpitations and leg swelling.  Gastrointestinal:  Negative for abdominal pain, blood in stool, constipation, diarrhea, nausea and vomiting.  Skin: Negative.   Neurological:  Negative for dizziness and headaches.  Psychiatric/Behavioral:  Negative for depression. The patient is not nervous/anxious.   All other systems reviewed and are negative.     Objective:    Physical Exam Vitals reviewed.  Constitutional:      General: She is not in acute distress.    Appearance: Normal appearance.  HENT:     Head: Normocephalic.     Right Ear: There is impacted cerumen.     Left Ear: There is impacted cerumen.  Cardiovascular:     Rate  and Rhythm: Normal rate and regular rhythm.     Pulses: Normal pulses.     Heart sounds: Normal heart sounds.     Comments: No obvious peripheral edema Pulmonary:     Effort: Pulmonary effort is normal.     Breath sounds: Normal breath sounds.  Skin:    General: Skin is warm and dry.     Capillary Refill: Capillary refill takes less than 2 seconds.  Neurological:     Mental Status: She is alert.  Psychiatric:        Mood and Affect: Mood normal.        Behavior: Behavior normal.        Thought Content: Thought content normal.        Judgment: Judgment normal.    BP 97/63   Pulse (!) 107   Temp (!) 97.5 F (36.4 C)   Ht 5\' 3"  (1.6 m)   Wt 126 lb (57.2 kg)   LMP 07/01/2021   SpO2 98%   BMI 22.32 kg/m  Wt Readings from Last 3 Encounters:  07/20/21 126 lb (57.2 kg) (46 %, Z= -0.10)*  07/14/21 127 lb (57.6 kg) (48 %, Z= -0.05)*  07/13/21 124 lb (56.2 kg) (42 %, Z= -0.20)*   * Growth percentiles are based on CDC (Girls, 2-20 Years) data.     There is no immunization history on file for this patient.  Diabetic Foot Exam - Simple   No data filed     Lab Results  Component Value Date   TSH 1.140 01/03/2021   Lab Results  Component Value Date   WBC 8.2 07/01/2021   HGB 12.5 07/01/2021   HCT 37.1 07/01/2021   MCV 83.9 07/01/2021   PLT 183 07/01/2021   Lab Results  Component Value Date   NA 138 07/01/2021   K 3.6 07/01/2021   CO2 24 07/01/2021   GLUCOSE 128 (H) 07/01/2021   BUN 14 07/01/2021   CREATININE 0.69 07/01/2021   BILITOT 0.6 07/01/2021   ALKPHOS 66 07/01/2021   AST 18 07/01/2021   ALT 14 07/01/2021   PROT 6.5 07/01/2021   ALBUMIN 4.1 07/01/2021   CALCIUM 9.0 07/01/2021   ANIONGAP 6 07/01/2021   No results found for: CHOL No results found for: HDL No results found for: LDLCALC No results found for: TRIG No results found for: CHOLHDL No results found for: 08/31/2021     Assessment & Plan:   Problem List Items Addressed This Visit    None Visit Diagnoses     Left ear pain    -  Primary   Relevant Orders   Ear Lavage   Decreased hearing of left ear       Relevant Orders   Ear Lavage   Tinnitus of both ears       Relevant Orders  Ear Lavage   Bilateral impacted cerumen            Bilateral ear irrigation completed with removal of large amounts of cerumen. No other drainage noted. No immediate complications. Patient symptoms resolved s/p procedure. Reevaluation of of bilateral ears is wnl with no TM erythema. Landmarks identifiable.  Patient to follow up as needed.    I am having Bethany Harris maintain her ALPRAZolam, diclofenac, and amitriptyline.  No orders of the defined types were placed in this encounter.    Kathrynn Speed, NP

## 2021-07-21 NOTE — Telephone Encounter (Signed)
Yeah could try GoodRx or can rx. One that is recommended.up to the patient.

## 2021-07-21 NOTE — Telephone Encounter (Signed)
Have not received PA decision on diclofenac na, called [employer], spoke with rep who stated PA was canceled yesterday, additional information needed. I advised him I haven't received request for additional information. He transferred me to PA line, spoke with Fleet Contras who stated she must try/fail 2 of following:  Celecoxib, OTC ibuprofen, indomethacin, ketorolac, meloxicam, OTC naproxen EC/DR, OTC plain naproxen,  sulindac. New PA submitted must include ICD 10 code, directions for use, tried/failed and clinicals stating tried/failed.  Of note, with Good Rx price for 30 tabs is as low as $3 at [employer]. Will notify NP.

## 2021-07-25 NOTE — Telephone Encounter (Signed)
Called [employer] language line, interpreter [phone], Molli Hazard who called patient. Person who answered call said she was at school, would call us back later.

## 2021-07-27 ENCOUNTER — Telehealth: Payer: Self-pay

## 2021-07-27 MED ORDER — ELYXYB 120 MG/4.8ML PO SOLN: 120.0000 mg | Freq: Every day | ORAL | 5 refills | Status: DC | PRN

## 2021-07-27 NOTE — Telephone Encounter (Signed)
Recommend trialing celecoxib (Elyxyb) 120 mg solution daily as needed for acute migraine relief. Max dose 120mg /24hrs. Do not use more than 14 days/month. Please take at onset of migraine headache for greatest benefit.

## 2021-07-27 NOTE — Telephone Encounter (Signed)
PA for Elyxyb (Key: [id number])  The [employer] is reviewing your PA request. Typically an electronic response will be received within 24-72 hours. To check for an update later, open this request from your dashboard.  You may close this dialog and return to your dashboard to perform other tasks.

## 2021-07-27 NOTE — Addendum Note (Signed)
Addended by: Ihor Austin L on: 07/27/2021 10:59 AM   Modules accepted: Orders

## 2021-07-27 NOTE — Telephone Encounter (Signed)
Used language line, Interpreter, [phone], Magid who called patient. He relayed NP's message. The patient would like NP to prescribe a different medicine that her insurance will approve. Will advise NP.

## 2021-07-28 NOTE — Telephone Encounter (Signed)
Received fax from [facility] with additional questions re: which triptans, NSAIDS has she tried. Triptans contraindicated due to her dizziness and nausea with migraines. NSAIDS contraindicated due to her GERD. Faxed back to [facility]. Of note, PA is for Elyxyb but Rx is for generic celecoxib which is on her formulary. Received confirmation of fax.

## 2021-08-02 ENCOUNTER — Encounter: Payer: Self-pay | Admitting: *Deleted

## 2021-08-02 NOTE — Telephone Encounter (Signed)
Approved.  Request Reference Number: [id number]. ELYXYB SOL 120/4.8 is approved through 07/28/2022. For further questions, call [employer] at [phone].

## 2021-08-09 NOTE — Congregational Nurse Program (Signed)
  Dept: [phone]   Congregational Nurse Program Note  Date of Encounter: 08/09/2021  Past Medical History: Past Medical History:  Diagnosis Date   GERD (gastroesophageal reflux disease)     Encounter Details:  Patient came in with accidental cut to her right second finger 2 days ago. Wound cleaned with normal saline and new dressing applied. No signs of infection noted. Advised to keep wound clean and dry.  Arman Bogus RN BSn PCCN  [facility]regational Nurse [phone]-cell [phone]-office

## 2021-08-15 ENCOUNTER — Ambulatory Visit (INDEPENDENT_AMBULATORY_CARE_PROVIDER_SITE_OTHER): Payer: Medicaid Other | Admitting: Clinical

## 2021-08-15 ENCOUNTER — Other Ambulatory Visit: Payer: Self-pay

## 2021-08-15 DIAGNOSIS — F320 Major depressive disorder, single episode, mild: Secondary | ICD-10-CM

## 2021-08-15 DIAGNOSIS — F32A Depression, unspecified: Secondary | ICD-10-CM

## 2021-08-15 NOTE — Progress Notes (Signed)
   THERAPIST PROGRESS NOTE  Session Time: 16 minutes  Participation Level: Active  Behavioral Response: CasualAlertEuthymic  Type of Therapy: Individual Therapy  Treatment Goals addressed: Coping  Interventions: CBT  Summary:  Bethany Harris is a 20 y.o. female who presents for the scheduled session oriented x5, appropriately dressed, and friendly.  Client denied hallucinations and delusions.  Client presented to the appointment with interpreter.  Client reported on today she is doing well.  Client reported she was told by a doctor that she needs to come to talk to someone about her health and that is what she thought the appointment for was today.  Client reported she understood the interpretation of what mental health therapy is and feels that she does not need at this time.  Client reported since she started taking her medication her symptoms have improved.  Client reported her home environment has also improved.  Client reported she is eating and sleeping well without complaint.    Suicidal/Homicidal: Nowithout intent/plan  Therapist Response:  Therapist began the appointment making introductions and discussing confidentiality. Therapist used CBT to explain helping with her mental health therapy issues. Therapist used CBT to ask the client about her current symptoms compared to medication compliance. Therapist used CBT to ask open-ended questions to assess depression and anxiety symptoms, and SI/HI. Therapist discussed options for follow-up as needed.   Plan: Client reported that she is doing well and is not desiring to follow-up with outpatient therapy at this time.  Therapist provided the client with office information if she chooses to schedule outpatient therapy in the future.  Diagnosis: Mild depression  Neena Rhymes Krystin Keeven, LCSW 08/15/2021

## 2021-08-31 ENCOUNTER — Ambulatory Visit: Payer: Medicaid Other | Admitting: Dermatology

## 2021-09-22 ENCOUNTER — Ambulatory Visit: Payer: Medicaid Other | Admitting: Adult Health

## 2021-09-28 ENCOUNTER — Ambulatory Visit (INDEPENDENT_AMBULATORY_CARE_PROVIDER_SITE_OTHER): Payer: Medicaid Other | Admitting: Psychiatry

## 2021-09-28 ENCOUNTER — Encounter (HOSPITAL_COMMUNITY): Payer: Self-pay | Admitting: Psychiatry

## 2021-09-28 ENCOUNTER — Other Ambulatory Visit: Payer: Self-pay

## 2021-09-28 VITALS — BP 117/71 | HR 88 | Ht 63.0 in | Wt 124.0 lb

## 2021-09-28 DIAGNOSIS — F411 Generalized anxiety disorder: Secondary | ICD-10-CM | POA: Diagnosis not present

## 2021-09-28 MED ORDER — MIRTAZAPINE 15 MG PO TABS: 15.0000 mg | ORAL_TABLET | Freq: Every day | ORAL | 2 refills | Status: DC

## 2021-09-28 NOTE — Progress Notes (Signed)
BH MD/PA/NP OP Progress Note  09/28/2021 3:06 PM Bethany Harris  MRN:  [id number]  Chief Complaint: "Im very Happy" Chief Complaint   Medication Management     HPI: 20 year old female seen today for follow up psychiatric evaluation. The patient has a history of anxiety and depression. She is currently managed on Amitriptyline 50mg  nightly. She notes that she stopped taking Amitriptyline 2 weeks ago due to having less anxiety and improvement in her headaches.  Today she is well-groomed, pleasant, cooperative, engaged in conversation, and maintains eye contact. Provider utilized an interpreter since the patient speaks [place]. Patient notes that since her last visit, she has been doing very well and recently got engaged. The patient is very excited about this and reports that her fianc is a very good guy. Her fianc is going to move from to be here with her. So far, the patient's father approves of her upcoming marriage. The patient is concerned that her father may change his mind.   The patient presents with her right hand wrapped in a bandage. The patient reports injuring her hand when she fell while running to answer the phone at midnight. The patient is still studying [place] and wishes to go to college. Her headaches have improved since her last visit.  Today she notes that she has minimal anxiety and depression.  Provider conducted her GAD-7  and the patient scored a 4, at her last she scored 19. A PHQ-9 was also administered by the and she scored a 3, at her last visit she scored a 14.  The patient reports a poor appetite, this has been her baseline for a while. She denies purposefully withholding food from herself. She states, "I just do not get hungry." The provider encouraged the patient to eat balanced meals and was educated on the benefits of eating on her mental and emotional health. The patient was also encouraged to drink protein shakes which may help with her appetite and nutrient  requirements.  She endorsed understanding but asked if there was a medication that could help increase her appetite.  Today she denies SI, HI, AVH, mania, and paranoia.  Today she is agreeable to starting Mirtazapine 15 mg to help with her poor appetite, anxiety, and depression. Potential side effects of medication and risks vs benefits of treatment vs non-treatment were explained and discussed. All questions were answered. The patient does not wish to restart Amitriptyline at this time. No other concerns noted at this time.   Visit Diagnosis:    ICD-10-CM   1. GAD (generalized anxiety disorder)  F41.1 mirtazapine (REMERON) 15 MG tablet      Past Psychiatric History: Depression and anxiety  Past Medical History:  Past Medical History:  Diagnosis Date   GERD (gastroesophageal reflux disease)    No past surgical history on file.  Family Psychiatric History: Denies  Family History: No family history on file.  Social History:  Social History   Socioeconomic History   Marital status: Single    Spouse name: Not on file   Number of children: Not on file   Years of education: Not on file   Highest education level: Not on file  Occupational History   Occupation: Student  Tobacco Use   Smoking status: Never   Smokeless tobacco: Never  Vaping Use   Vaping Use: Never used  Substance and Sexual Activity   Alcohol use: Never   Drug use: Never   Sexual activity: Never  Other Topics Concern  Not on file  Social History Narrative   Lives with dad and 6 other siblings   Right handed   Drinks caffeine rarely   Social Determinants of Health   Financial Resource Strain: Not on file  Food Insecurity: Not on file  Transportation Needs: Not on file  Physical Activity: Not on file  Stress: Not on file  Social Connections: Not on file    Allergies: No Known Allergies  Metabolic Disorder Labs: No results found for: HGBA1C, MPG No results found for: PROLACTIN No results found  for: CHOL, TRIG, HDL, CHOLHDL, VLDL, LDLCALC Lab Results  Component Value Date   TSH 1.140 01/03/2021    Therapeutic Level Labs: No results found for: LITHIUM No results found for: VALPROATE No components found for:  CBMZ  Current Medications: Current Outpatient Medications  Medication Sig Dispense Refill   mirtazapine (REMERON) 15 MG tablet Take 1 tablet (15 mg total) by mouth at bedtime. 30 tablet 2   ALPRAZolam (XANAX) 0.25 MG tablet Take 0.25 mg by mouth at bedtime as needed for anxiety. (Patient not taking: Reported on 07/20/2021)     Celecoxib (ELYXYB) 120 MG/4.8ML SOLN Take 120 mg by mouth daily as needed (for acute migraine relief). 14 mL 5   No current facility-administered medications for this visit.     Musculoskeletal: Strength & Muscle Tone: within normal limits Gait & Station: normal Patient leans: N/A  Psychiatric Specialty Exam: Review of Systems  Blood pressure 117/71, pulse 88, height 5\' 3"  (1.6 m), weight 124 lb (56.2 kg), SpO2 100 %.Body mass index is 21.97 kg/m.  General Appearance: Well Groomed  Eye Contact:  Good  Speech:  Clear and Coherent and Normal Rate  Volume:  Normal  Mood:  Euphoric and notes anxious about upcoming wedding  Affect:  Appropriate and Congruent  Thought Process:  Coherent, Goal Directed and Linear  Orientation:  Full (Time, Place, and Person)  Thought Content: WDL and Logical   Suicidal Thoughts:  No  Homicidal Thoughts:  No  Memory:  Immediate;   Good Recent;   Good Remote;   Good  Judgement:  Good  Insight:  Good  Psychomotor Activity:  Normal  Concentration:  Concentration: Good and Attention Span: Good  Recall:  Good  Fund of Knowledge: Good  Language: Good  Akathisia:  No  Handed:  Right  AIMS (if indicated): Not done  Assets:  Communication Skills Desire for Improvement Financial Resources/Insurance Housing Social Support  ADL's:  Intact  Cognition: WNL  Sleep:  Good   Screenings: GAD-7    Flowsheet  Row Clinical Support from 09/28/2021 in [facility] Office Visit from 04/13/2021 in [facility] Office Visit from 03/04/2021 in [facility]  Total GAD-7 Score 4 19 11       PHQ2-9    Flowsheet Row Clinical Support from 09/28/2021 in [facility] Office Visit from 07/20/2021 in [facility] Office Visit from 04/13/2021 in [facility] Office Visit from 03/04/2021 in [facility] Office Visit from 01/14/2021 in [facility]  PHQ-2 Total Score 0 2 4 4 3   PHQ-9 Total Score 3 9 14 15 10       Flowsheet Row Clinical Support from 09/28/2021 in [facility] Counselor from 08/15/2021 in [facility] ED from 07/01/2021 in [facility] EMERGENCY DEPARTMENT  C-SSRS RISK CATEGORY No  Risk No Risk No Risk        Assessment and Plan: Patient notes that her provider and her depression has improved since her last visit.  She did Archivist that she is anxious about her engagement/marriage and fears that her father will reject the marriage.  She does Archivist that she has poor appetite and requests medication to help manage her appetite.  Today mirtazapine 15 mg started to help manage anxiety, depression, and appetite.  Provider encouraged patient to eat well-balanced meals or have protein shakes if she is not able to eat.  She endorsed understanding and agreed.  At this time amitriptyline not restarted.  1. GAD (generalized anxiety disorder)  Start- mirtazapine (REMERON) 15 MG tablet; Take 1 tablet (15 mg total) by mouth at bedtime.  Dispense: 30 tablet; Refill: 2  Follow-up in 3 months Follow-up with therapy  Shanna Cisco, NP 09/28/2021, 3:06 PM

## 2021-10-03 ENCOUNTER — Ambulatory Visit (INDEPENDENT_AMBULATORY_CARE_PROVIDER_SITE_OTHER): Payer: Medicaid Other | Admitting: Dermatology

## 2021-10-03 ENCOUNTER — Other Ambulatory Visit: Payer: Self-pay

## 2021-10-03 DIAGNOSIS — L249 Irritant contact dermatitis, unspecified cause: Secondary | ICD-10-CM | POA: Diagnosis not present

## 2021-10-03 DIAGNOSIS — L700 Acne vulgaris: Secondary | ICD-10-CM

## 2021-10-03 DIAGNOSIS — L239 Allergic contact dermatitis, unspecified cause: Secondary | ICD-10-CM

## 2021-10-03 DIAGNOSIS — B360 Pityriasis versicolor: Secondary | ICD-10-CM

## 2021-10-03 MED ORDER — CLOBETASOL PROPIONATE 0.05 % EX CREA: 1.0000 "application " | TOPICAL_CREAM | Freq: Two times a day (BID) | CUTANEOUS | 0 refills | Status: DC

## 2021-10-03 MED ORDER — SELENIUM SULFIDE 2.5 % EX LOTN: 1.0000 "application " | TOPICAL_LOTION | CUTANEOUS | 1 refills | Status: DC

## 2021-10-03 MED ORDER — CLOBETASOL PROPIONATE 0.05 % EX SOLN: 1.0000 "application " | Freq: Two times a day (BID) | CUTANEOUS | 0 refills | Status: DC

## 2021-10-03 MED ORDER — FLUCONAZOLE 200 MG PO TABS: 200.0000 mg | ORAL_TABLET | Freq: Every day | ORAL | 0 refills | Status: AC

## 2021-10-03 NOTE — Progress Notes (Signed)
New Patient Visit  Subjective  Bethany Harris is a 20 y.o. female who presents for the following: rash (Chest, back, feet, R wrist, ~76yr for chest and back, arm and lower legs 4-52m, used in the past miconazole cr to chest, and TMC 0.5% oint to back, very itchy) and Acne (forehead).  Patient accompanied by neighbor who contributes to history.  The following portions of the chart were reviewed this encounter and updated as appropriate:       Review of Systems:  No other skin or systemic complaints except as noted in HPI or Assessment and Plan.  Objective  Well appearing patient in no apparent distress; mood and affect are within normal limits.  A focused examination was performed including face, chest, back, arms, feet. Relevant physical exam findings are noted in the Assessment and Plan.  back Tan brown macules with fine scale post neck, upper back, chest  L forearm, legs Circumferential band of pink brown crusted scaly paps on L wrist, in area where metal bracelet rests Multiple pink brown excoriated paps with follicular prominence and xerosis lower legs  face Closed comedones forehead   Assessment & Plan  Tinea versicolor back  Tinea versicolor is a chronic recurrent skin rash causing discolored scaly spots most commonly seen on back, chest, and/or shoulders.  It is generally asymptomatic. The rash is due to overgrowth of a common type of yeast present on everyone's skin and it is not contagious.  It tends to flare more in the summer due to increased sweating on trunk.  After rash is treated, the scaliness will resolve, but the discoloration will take longer to return to normal pigmentation. The periodic use of an OTC medicated soap/shampoo with zinc or selenium sulfide can be helpful to prevent yeast overgrowth and recurrence.   Start Fluconazole 200mg  1 po qd x 3 days Start Selenium sulfide shampoo apply to body hs and wash off in the am x 3 nights, then use 2 times a week in  the shower as body wash.  Let sit several minutes prior to rinsing.  Side effects of fluconazole (diflucan) include nausea, diarrhea, headache, dizziness, taste changes, rare risk of irritation of the liver, allergy, or decreased blood counts (which could show up as infection or tiredness).    selenium sulfide (SELSUN) 2.5 % shampoo - back Apply 1 application topically See admin instructions. Apply to chest, neck, back nightly for 3 nights and wash off in the morning, then use as a body wash on back, chest and neck 2 times a week, let sit 5 minutes and rinse off  fluconazole (DIFLUCAN) 200 MG tablet - back Take 1 tablet (200 mg total) by mouth daily for 3 days.  Allergic contact dermatitis, unspecified trigger L forearm, legs  With Id reaction, likely to nickel contact from bracelet  Stop wearing metal bracelet  Start Clobetasol cream bid to aa rash on left arm until clear, avoid face, groin, axilla. Caution atrophy with long-term use.   Start Clobetasol/Cerave mix bid to legs until clear, avoid face, groin, axilla.  Instructions given.  clobetasol cream (TEMOVATE) 0.05 % - L forearm, legs Apply 1 application topically 2 (two) times daily. Apply to rash on left arm bid until clear, avoid face, groin, axilla  clobetasol (TEMOVATE) 0.05 % external solution - L forearm, legs Apply 1 application topically 2 (two) times daily. Mix whole bottle in 1 tub of cerave cream and use on legs bid until rash clear, avoid face, groin, axilla  Acne  vulgaris face  Comedonal   Will discuss acne treatment on f/u  Return in about 1 month (around 11/02/2021) for TV, ACD with Id reaction, acne.   I, Ardis Rowan, RMA, am acting as scribe for Willeen Niece, MD .  Documentation: I have reviewed the above documentation for accuracy and completeness, and I agree with the above.  Willeen Niece MD

## 2021-10-03 NOTE — Patient Instructions (Addendum)
If you have any questions or concerns for your doctor, please call our main line at [phone] and press option 4 to reach your doctor's medical assistant. If no one answers, please leave a voicemail as directed and we will return your call as soon as possible. Messages left after 4 pm will be answered the following business day.   You may also send [place] a message via [place]. We typically respond to MyChart messages within 1-2 business days.  For prescription refills, please ask your pharmacy to contact our office. Our fax number is [phone].  If you have an urgent issue when the clinic is closed that cannot wait until the next business day, you can page your doctor at the number below.    Please note that while we do our best to be available for urgent issues outside of office hours, we are not available 24/7.   If you have an urgent issue and are unable to reach [place], you may choose to seek medical care at your doctor's office, retail clinic, urgent care center, or emergency room.  If you have a medical emergency, please immediately call 911 or go to the emergency department.  Pager Numbers  - Dr. Nehemiah Massed: [phone]  - Dr. Laurence Ferrari: [phone]  - Dr. Nicole Kindred: [phone]  In the event of inclement weather, please call our main line at [phone] for an update on the status of any delays or closures.  Dermatology Medication Tips: Please keep the boxes that topical medications come in in order to help keep track of the instructions about where and how to use these. Pharmacies typically print the medication instructions only on the boxes and not directly on the medication tubes.   If your medication is too expensive, please contact our office at [phone] option 4 or send [place] a message through [place].   We are unable to tell what your co-pay for medications will be in advance as this is different depending on your insurance coverage. However, we may be able to find a substitute  medication at lower cost or fill out paperwork to get insurance to cover a needed medication.   If a prior authorization is required to get your medication covered by your insurance company, please allow [place] 1-2 business days to complete this process.  Drug prices often vary depending on where the prescription is filled and some pharmacies may offer cheaper prices.  The website www.goodrx.com contains coupons for medications through different pharmacies. The prices here do not account for what the cost may be with help from insurance (it may be cheaper with your insurance), but the website can give you the price if you did not use any insurance.  - You can print the associated coupon and take it with your prescription to the pharmacy.  - You may also stop by our office during regular business hours and pick up a GoodRx coupon card.  - If you need your prescription sent electronically to a different pharmacy, notify our office through [facility] or by phone at [phone] option 4.   Eczema Skin Care  Buy TWO 16oz jars of CeraVe moisturizing cream  [facility], [facility], [facility] (no prescription needed)  Costs about $15 per jar   Jar #1: Use as a moisturizer as needed. Can be applied to any area of the body. Use twice daily to unaffected areas.  Jar #2: Pour one 50ml bottle of clobetasol 0.05% solution into jar, mix well. Label this jar to indicate the medication has been  added. Use twice daily to affected areas. Do not apply to face, groin or underarms.  Moisturizer may burn or sting initially. Try for at least 4 weeks.      Dry Skin Care  What causes dry skin?  Dry skin is common and results from inadequate moisture in the outer skin layers. Dry skin usually results from the excessive loss of moisture from the skin surface. This occurs due to two major factors: Normally the skin's oil glands deposit a layer of oil on the skin's surface. This layer of oil prevents the loss of  moisture from the skin. Exposure to soaps, cleaners, solvents, and disinfectants removes this oily film, allowing water to escape. Water loss from the skin increases when the humidity is low. During winter months we spend a lot of time indoors where the air is heated. Heated air has very low humidity. This also contributes to dry skin.  A tendency for dry skin may accompany such disorders as eczema. Also, as people age, the number of functioning oil glands decreases, and the tendency toward dry skin can be a sensation of skin tightness when emerging from the shower.  How do I manage dry skin?  Humidify your environment. This can be accomplished by using a humidifier in your bedroom at night during winter months. Bathing can actually put moisture back into your skin if done right. Take the following steps while bathing to sooth dry skin: Avoid hot water, which only dries the skin and makes itching worse. Use warm water. Avoid washcloths or extensive rubbing or scrubbing. Use mild soaps like unscented Dove, Oil of Olay, Cetaphil, Basis, or CeraVe. If you take baths rather than showers, rinse off soap residue with clean water before getting out of tub. Once out of the shower/tub, pat dry gently with a soft towel. Leave your skin damp. While still damp, apply any medicated ointment/cream you were prescribed to the affected areas. After you apply your medicated ointment/cream, then apply your moisturizer to your whole body.This is the most important step in dry skin care. If this is omitted, your skin will continue to be dry. The choice of moisturizer is also very important. In general, lotion will not provider enough moisture to severely dry skin because it is water based. You should use an ointment or cream. Moisturizers should also be unscented. Good choices include Vaseline (plain petrolatum), Aquaphor, Cetaphil, CeraVe, Vanicream, DML Forte, Aveeno moisture, or Eucerin Cream. Bath oils can be  helpful, but do not replace the application of moisturizer after the bath. In addition, they make the tub slippery causing an increased risk for falls. Therefore, we do not recommend their use.

## 2021-11-02 ENCOUNTER — Other Ambulatory Visit: Payer: Self-pay

## 2021-11-02 ENCOUNTER — Encounter: Payer: Self-pay | Admitting: Dermatology

## 2021-11-02 ENCOUNTER — Ambulatory Visit (INDEPENDENT_AMBULATORY_CARE_PROVIDER_SITE_OTHER): Payer: Medicaid Other | Admitting: Dermatology

## 2021-11-02 DIAGNOSIS — L239 Allergic contact dermatitis, unspecified cause: Secondary | ICD-10-CM

## 2021-11-02 DIAGNOSIS — B360 Pityriasis versicolor: Secondary | ICD-10-CM

## 2021-11-02 DIAGNOSIS — L503 Dermatographic urticaria: Secondary | ICD-10-CM

## 2021-11-02 DIAGNOSIS — L810 Postinflammatory hyperpigmentation: Secondary | ICD-10-CM

## 2021-11-02 DIAGNOSIS — L249 Irritant contact dermatitis, unspecified cause: Secondary | ICD-10-CM | POA: Diagnosis not present

## 2021-11-02 MED ORDER — HYDROXYZINE HCL 10 MG PO TABS: ORAL_TABLET | ORAL | 2 refills | Status: DC

## 2021-11-02 MED ORDER — LEVOCETIRIZINE DIHYDROCHLORIDE 5 MG PO TABS: 5.0000 mg | ORAL_TABLET | Freq: Every morning | ORAL | 2 refills | Status: DC

## 2021-11-02 MED ORDER — PREDNISONE 5 MG PO TABS
ORAL_TABLET | ORAL | 0 refills | Status: DC
Start: 2021-11-02 — End: 2021-11-14

## 2021-11-02 MED ORDER — KETOCONAZOLE 2 % EX CREA
TOPICAL_CREAM | CUTANEOUS | 2 refills | Status: DC
Start: 2021-11-02 — End: 2021-11-14

## 2021-11-02 NOTE — Progress Notes (Signed)
   Follow-Up Visit   Subjective  Bethany Harris is a 20 y.o. female who presents for the following: Follow-up (One month recheck TV and ACD. Not improved. Clobetasol cream helped some at first, not helping now. Itching, worse at night. Selenium sulfide shampoo was too expensive so she did not purchase. Fluconazole tablets helped. ). No H/O allergies or asthma, per patient.  No one else itching at home or with similar rash.   The following portions of the chart were reviewed this encounter and updated as appropriate:      Review of Systems: No other skin or systemic complaints except as noted in HPI or Assessment and Plan.   Objective  Well appearing patient in no apparent distress; mood and affect are within normal limits.  A focused examination was performed including face, chest, back, arms, legs. Relevant physical exam findings are noted in the Assessment and Plan.  post neck/upper back, chest Multiple dyspigmented macules and patches with fine scale.   legs, left arm/wrist Scaly hyperpigmented papules and patches with excoriations. Pink linear edematous plaque R mid back at braline Hands, wrists, web spaces, axilla clear (doubt scabies)  Left Forearm - Anterior hyperpigmented macules and patches circumferential L wrist  Assessment & Plan  Tinea versicolor post neck/upper back, chest  Start Ketoconazole 2% cream twice daily to affected areas on chest and back.   Selenium sulfide lotion was not covered  ketoconazole (NIZORAL) 2 % cream - post neck/upper back, chest Apply twice daily to affected areas on chest and back  Allergic contact dermatitis, unspecified trigger legs, left arm/wrist  +Dermatographism, with persistent ID reaction (continued itching)  Start 2 week Prednisone taper, starting at 60 mg PO qAM, instructions given Risks of prednisone taper discussed including mood irritability, insomnia, weight gain, stomach ulcers, increased risk of infection, increased  blood sugar (diabetes), hypertension, osteoporosis with long-term or frequent use, and rare risk of avascular necrosis of the hip.    Start Levocetirizine 5mg  1 tablet every morning.   Start Hydroxyzine 10mg  take 1 to 3 tablets every night prn for itching.   Continue Clobetasol solution/CeraVe cream mixture to affected body areas bid for itchy rash. Avoid face, groin, underarms.  Recommend mild soap (pt uses Dove) and moisturizing cream 1-2 times daily.  Gentle skin care handout provided.    levocetirizine (XYZAL) 5 MG tablet - legs, left arm/wrist Take 1 tablet (5 mg total) by mouth every morning.  hydrOXYzine (ATARAX) 10 MG tablet - legs, left arm/wrist Take 1 to 3 tablets qhs for itching  predniSONE (DELTASONE) 5 MG tablet - legs, left arm/wrist Take PO QAM with food as directed until gone for 2 week taper.  Related Medications clobetasol cream (TEMOVATE) 0.05 % Apply 1 application topically 2 (two) times daily. Apply to rash on left arm bid until clear, avoid face, groin, axilla  clobetasol (TEMOVATE) 0.05 % external solution Apply 1 application topically 2 (two) times daily. Mix whole bottle in 1 tub of cerave cream and use on legs bid until rash clear, avoid face, groin, axilla  Post-inflammatory hyperpigmentation Left Forearm - Anterior  Due to recent metal dermatitis.  Improved. Pigmentation will take time to fade  Return for TV/ACD recheck in 3 weeks.  I, , CMA, am acting as scribe for , MD.  Documentation: I have reviewed the above documentation for accuracy and completeness, and I agree with the above.  Lawson Radar MD

## 2021-11-02 NOTE — Patient Instructions (Addendum)
Gentle Skin Care Guide  1. Bathe no more than once a day.  2. Avoid bathing in hot water  3. Use a mild soap like Dove, Vanicream, Cetaphil, CeraVe. Can use Lever 2000 or Cetaphil antibacterial soap  4. Use soap only where you need it. On most days, use it under your arms, between your legs, and on your feet. Let the water rinse other areas unless visibly dirty.  5. When you get out of the bath/shower, use a towel to gently blot your skin dry, don't rub it.  6. While your skin is still a little damp, apply a moisturizing cream such as Vanicream, CeraVe, Cetaphil, Eucerin, Sarna lotion or plain Vaseline Jelly. For hands apply Neutrogena [place] Hand Cream or Excipial Hand Cream.  7. Reapply moisturizer any time you start to itch or feel dry.  8. Sometimes using free and clear laundry detergents can be helpful. Fabric softener sheets should be avoided. Downy Free & Gentle liquid, or any liquid fabric softener that is free of dyes and perfumes, it acceptable to use  9. If your doctor has given you prescription creams you may apply moisturizers over them   Recommend OTC [place] Rapid Relief Anti-Itch cream (pramoxine + menthol), CeraVe Anti-itch cream or lotion (pramoxine), Sarna lotion (Original- menthol + camphor or Sensitive- pramoxine) or Eucerin 12 hour Itch Relief lotion (menthol) up to 3 times per day to areas on body that are itchy.  Start Levocetirizine 5mg  1 tablet every morning.   Start Hydroxyzine 10mg  take 1 to 3 tablets every night as needed for itching.   Continue Clobetasol solution/CeraVe cream mixture to affected body areas as needed for itching. Avoid face, groin, underarms.  Start Ketoconazole 2% cream twice daily to affected areas (dark spots) on chest and back.   2 Week Prednisone Taper  You will be given a prescription for 100 tablets of oral Prednisone. It is very important that you take this according to the exact schedule provided below. This type of regimen  for taking medication is often called a "taper", because your dosage will steadily decrease over a two week period until it is discontinued altogether.  ALWAYS take this medicine with food to prevent it from irritating your stomach. You should also take your Prednisone during morning hours.  Call the clinic at [phone] if you gain more than two pounds in one day, notice swelling anywhere on your body, have shortness of breath, black or red bowel movements, brown or red vomitus, desire to drink large amounts of fluids, a fever, or extreme weakness.   Oral Prednisone over Two Weeks  Day  Week 1  Week 2   1  12  tablets  7 tablets   2  12 tablets  6 tablets   3  11 tablets  5 tablets   4  10 tablets  4 tablets   5  10 tablets  3 tablets   6  9 tablets  2 tablets   7  8 tablets  1 tablet     Risks of prednisone taper discussed including mood irritability, insomnia, weight gain, stomach ulcers, increased risk of infection, increased blood sugar (diabetes), hypertension, osteoporosis with long-term or frequent use, and rare risk of avascular necrosis of the hip.

## 2021-11-04 ENCOUNTER — Emergency Department (HOSPITAL_COMMUNITY): Payer: Medicaid Other

## 2021-11-04 ENCOUNTER — Encounter (HOSPITAL_COMMUNITY): Payer: Self-pay | Admitting: *Deleted

## 2021-11-04 ENCOUNTER — Emergency Department (HOSPITAL_COMMUNITY)
Admission: EM | Admit: 2021-11-04 | Discharge: 2021-11-05 | Disposition: A | Discharge status: Home / Self Care | Payer: Medicaid Other | Attending: Emergency Medicine | Admitting: Emergency Medicine

## 2021-11-04 ENCOUNTER — Other Ambulatory Visit: Payer: Self-pay

## 2021-11-04 DIAGNOSIS — R1032 Left lower quadrant pain: Secondary | ICD-10-CM | POA: Insufficient documentation

## 2021-11-04 DIAGNOSIS — R112 Nausea with vomiting, unspecified: Secondary | ICD-10-CM | POA: Diagnosis not present

## 2021-11-04 DIAGNOSIS — Z20822 Contact with and (suspected) exposure to covid-19: Secondary | ICD-10-CM | POA: Insufficient documentation

## 2021-11-04 DIAGNOSIS — R197 Diarrhea, unspecified: Secondary | ICD-10-CM | POA: Insufficient documentation

## 2021-11-04 LAB — I-STAT BETA HCG BLOOD, ED (MC, WL, AP ONLY): I-stat hCG, quantitative: <5 m[IU]/mL (ref <5)

## 2021-11-04 LAB — CBC
HCT: 39.1 % (ref 36.0–46.0)
Hemoglobin: 13.5 g/dL (ref 12.0–15.0)
MCH: 28.9 pg (ref 26.0–34.0)
MCHC: 34.5 g/dL (ref 30.0–36.0)
MCV: 83.7 fL (ref 80.0–100.0)
Platelets: 212 10*3/uL (ref 150–400)
RBC: 4.67 MIL/uL (ref 3.87–5.11)
RDW: 11.8 % (ref 11.5–15.5)
WBC: 10.3 10*3/uL (ref 4.0–10.5)
nRBC: 0 % (ref 0.0–0.2)

## 2021-11-04 LAB — COMPREHENSIVE METABOLIC PANEL WITH GFR
ALT: 13 U/L (ref 0–44)
AST: 20 U/L (ref 15–41)
Albumin: 4.6 g/dL (ref 3.5–5.0)
Alkaline Phosphatase: 76 U/L (ref 38–126)
Anion gap: 11 (ref 5–15)
BUN: 8 mg/dL (ref 6–20)
CO2: 20 mmol/L — ABNORMAL LOW (ref 22–32)
Calcium: 10.2 mg/dL (ref 8.9–10.3)
Chloride: 109 mmol/L (ref 98–111)
Creatinine, Ser: 0.74 mg/dL (ref 0.44–1.00)
GFR, Estimated: >60 mL/min (ref >60)
Glucose, Bld: 144 mg/dL — ABNORMAL HIGH (ref 70–99)
Potassium: 3.6 mmol/L (ref 3.5–5.1)
Sodium: 140 mmol/L (ref 135–145)
Total Bilirubin: 0.4 mg/dL (ref 0.3–1.2)
Total Protein: 7.3 g/dL (ref 6.5–8.1)

## 2021-11-04 LAB — LIPASE, BLOOD: Lipase: 50 U/L (ref 11–51)

## 2021-11-04 LAB — RESP PANEL BY RT-PCR (FLU A&B, COVID) ARPGX2
Influenza A by PCR: NEGATIVE
Influenza B by PCR: NEGATIVE
SARS Coronavirus 2 by RT PCR: NEGATIVE

## 2021-11-04 MED ORDER — KETOROLAC TROMETHAMINE 15 MG/ML IJ SOLN
15.0000 mg | Freq: Once | INTRAMUSCULAR | Status: AC
  Administered 2021-11-04: 15 mg via INTRAVENOUS
  Filled 2021-11-04: qty 1

## 2021-11-04 MED ORDER — ACETAMINOPHEN 500 MG PO TABS
1000.0000 mg | ORAL_TABLET | Freq: Once | ORAL | Status: AC
  Administered 2021-11-05: 1000 mg via ORAL
  Filled 2021-11-04: qty 2

## 2021-11-04 MED ORDER — IOHEXOL 300 MG/ML  SOLN
100.0000 mL | Freq: Once | INTRAMUSCULAR | Status: AC | PRN
  Administered 2021-11-04: 100 mL via INTRAVENOUS

## 2021-11-04 NOTE — ED Provider Notes (Signed)
[facility] EMERGENCY DEPARTMENT Provider Note   CSN: [id number] Arrival date & time: 11/04/21  2012     History Chief Complaint  Patient presents with   Abdominal Pain    Bethany Harris is a 20 y.o. female.  The history is provided by the patient and medical records.  Abdominal Pain Pain location:  LLQ Pain quality: aching   Pain radiates to:  Does not radiate Pain severity:  Severe Onset quality:  Gradual Duration:  3 days Timing:  Intermittent Progression:  Waxing and waning Chronicity:  New Relieved by:  Nothing Worsened by:  Nothing Associated symptoms: diarrhea, nausea and vomiting   Associated symptoms: no chest pain, no chills, no cough, no dysuria, no fever, no hematuria, no shortness of breath and no sore throat       Past Medical History:  Diagnosis Date   GERD (gastroesophageal reflux disease)     Patient Active Problem List   Diagnosis Date Noted   GAD (generalized anxiety disorder) 04/13/2021   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 No pertinent surgical history.   OB History     Gravida  0   Para  0   Term  0   Preterm  0   AB  0   Living  0      SAB  0   IAB  0   Ectopic  0   Multiple  0   Live Births  0           No family history on file.  Social History   Tobacco Use   Smoking status: Never   Smokeless tobacco: Never  Vaping Use   Vaping Use: Never used  Substance Use Topics   Alcohol use: Never   Drug use: Never    Home Medications Prior to Admission medications   Medication Sig Start Date End Date Taking? Authorizing Provider  ALPRAZolam Duanne Moron) 0.25 MG tablet Take 0.25 mg by mouth at bedtime as needed for anxiety. Patient not taking: Reported on  07/20/2021    [provider]  BLISOVI FE 1/20 1-20 MG-MCG tablet Take 1 tablet by mouth daily. 08/06/21   [provider]  Celecoxib (ELYXYB) 120 MG/4.8ML SOLN Take 120 mg by mouth daily as needed (for acute migraine relief). 07/27/21   Frann Rider, NP  clobetasol (TEMOVATE) 0.05 % external solution Apply 1 application topically 2 (two) times daily. Mix whole bottle in 1 tub of cerave cream and use on legs bid until rash clear, avoid face, groin, axilla 10/03/21   Brendolyn Patty, MD  clobetasol cream (TEMOVATE) [id number] % Apply 1 application topically 2 (two) times daily. Apply to rash on left arm bid until clear, avoid face, groin, axilla 10/03/21   Brendolyn Patty, MD  hydrOXYzine (ATARAX) 10 MG tablet Take 1 to 3 tablets qhs for itching 11/02/21   Brendolyn Patty, MD  ketoconazole (NIZORAL) 2 % cream Apply twice daily to affected areas on chest and back 11/02/21   Brendolyn Patty, MD  levocetirizine (XYZAL) 5 MG tablet Take 1 tablet (5 mg total) by mouth every morning. 11/02/21   Brendolyn Patty, MD  mirtazapine (REMERON) 15 MG tablet Take 1 tablet (15 mg total) by mouth at bedtime. Patient not taking: Reported on 10/03/2021 09/28/21   Ronne Binning,  Brittney E, NP  predniSONE (DELTASONE) 5 MG tablet Take PO QAM with food as directed until gone for 2 week taper. 11/02/21   Willeen NieceStewart, Tara, MD    Allergies    Patient has no known allergies.  Review of Systems   Review of Systems  Constitutional:  Negative for chills and fever.  HENT:  Negative for ear pain and sore throat.   Eyes:  Negative for pain and visual disturbance.  Respiratory:  Negative for cough and shortness of breath.   Cardiovascular:  Negative for chest pain and palpitations.  Gastrointestinal:  Positive for abdominal pain, diarrhea, nausea and vomiting.  Genitourinary:  Negative for dysuria and hematuria.  Musculoskeletal:  Negative for arthralgias and back pain.  Skin:  Negative for color change and rash.  Neurological:  Negative  for seizures and syncope.  All other systems reviewed and are negative.  Physical Exam Updated Vital Signs BP 124/82 (BP Location: Right Arm)   Pulse (!) 102   Temp 98.7 F (37.1 C) (Oral)   Resp 13   LMP 10/16/2021   SpO2 99%   Physical Exam Vitals and nursing note reviewed.  Constitutional:      General: She is not in acute distress.    Appearance: Normal appearance. She is well-developed.  HENT:     Head: Normocephalic and atraumatic.     Right Ear: External ear normal.     Left Ear: External ear normal.     Nose: Nose normal. No congestion or rhinorrhea.     Mouth/Throat:     Mouth: Mucous membranes are moist.  Eyes:     Extraocular Movements: Extraocular movements intact.     Conjunctiva/sclera: Conjunctivae normal.     Pupils: Pupils are equal, round, and reactive to light.  Cardiovascular:     Rate and Rhythm: Normal rate and regular rhythm.     Pulses: Normal pulses.     Heart sounds: No murmur heard. Pulmonary:     Effort: Pulmonary effort is normal. No respiratory distress.     Breath sounds: Normal breath sounds. No wheezing, rhonchi or rales.  Abdominal:     General: Abdomen is flat. Bowel sounds are normal.     Palpations: Abdomen is soft.     Tenderness: There is abdominal tenderness (LLQ). There is no guarding or rebound.  Musculoskeletal:        General: No swelling, tenderness or deformity.     Cervical back: Normal range of motion and neck supple. No rigidity.  Skin:    General: Skin is warm and dry.     Capillary Refill: Capillary refill takes less than 2 seconds.  Neurological:     General: No focal deficit present.     Mental Status: She is alert and oriented to person, place, and time.  Psychiatric:        Mood and Affect: Mood normal.    ED Results / Procedures / Treatments   Labs (all labs ordered are listed, but only abnormal results are displayed) Labs Reviewed  COMPREHENSIVE METABOLIC PANEL - Abnormal; Notable for the following  components:      Result Value   CO2 20 (*)    Glucose, Bld 144 (*)    All other components within normal limits  RESP PANEL BY RT-PCR (FLU A&B, COVID) ARPGX2  LIPASE, BLOOD  CBC  URINALYSIS, ROUTINE W REFLEX MICROSCOPIC  I-STAT BETA HCG BLOOD, ED ([facility], [facility], [facility] ONLY)    EKG None  Radiology US PELVIS (TRANSABDOMINAL ONLY)  Result Date: 11/04/2021 CLINICAL DATA:  Left lower quadrant abdominal pain EXAM: TRANSABDOMINAL ULTRASOUND OF PELVIS DOPPLER ULTRASOUND OF OVARIES TECHNIQUE: Transabdominal ultrasound examination of the pelvis was performed including evaluation of the uterus, ovaries, adnexal regions, and pelvic cul-de-sac. Color and duplex Doppler ultrasound was utilized to evaluate blood flow to the ovaries. COMPARISON:  None. FINDINGS: Uterus Measurements: 6.7 x 2.9 x 5.4 cm = volume: 54.8 mL. No fibroids or other mass visualized. Endometrium Thickness: 4 mm.  No focal abnormality visualized. Right ovary Measurements: 4.1 x 1.7 x 1.8 cm = volume: 6.6 mL. Normal appearance/no adnexal mass. Left ovary Measurements: 3.6 x 2.7 x 2.9 cm = volume: 14.2 mL. Normal appearance/no adnexal mass. Pulsed Doppler evaluation demonstrates normal low-resistance arterial and venous waveforms in both ovaries. Other: No free fluid. Patient refused endovaginal examination. IMPRESSION: 1. Age-appropriate pelvic ultrasound.  No acute findings. Electronically Signed   By: Sharlet Salina M.D.   On: 11/04/2021 22:31   US PELVIC DOPPLER (TORSION R/O OR MASS ARTERIAL FLOW)  Result Date: 11/04/2021 CLINICAL DATA:  Left lower quadrant abdominal pain EXAM: TRANSABDOMINAL ULTRASOUND OF PELVIS DOPPLER ULTRASOUND OF OVARIES TECHNIQUE: Transabdominal ultrasound examination of the pelvis was performed including evaluation of the uterus, ovaries, adnexal regions, and pelvic cul-de-sac. Color and duplex Doppler ultrasound was utilized to evaluate blood flow to the ovaries. COMPARISON:  None. FINDINGS: Uterus Measurements: 6.7 x  2.9 x 5.4 cm = volume: 54.8 mL. No fibroids or other mass visualized. Endometrium Thickness: 4 mm.  No focal abnormality visualized. Right ovary Measurements: 4.1 x 1.7 x 1.8 cm = volume: 6.6 mL. Normal appearance/no adnexal mass. Left ovary Measurements: 3.6 x 2.7 x 2.9 cm = volume: 14.2 mL. Normal appearance/no adnexal mass. Pulsed Doppler evaluation demonstrates normal low-resistance arterial and venous waveforms in both ovaries. Other: No free fluid. Patient refused endovaginal examination. IMPRESSION: 1. Age-appropriate pelvic ultrasound.  No acute findings. Electronically Signed   By: Sharlet Salina M.D.   On: 11/04/2021 22:31    Procedures Procedures   Medications Ordered in ED Medications  ketorolac (TORADOL) 15 MG/ML injection 15 mg (15 mg Intravenous Given 11/04/21 2112)    ED Course  I have reviewed the triage vital signs and the nursing notes.  Pertinent labs & imaging results that were available during my care of the patient were reviewed by me and considered in my medical decision making (see chart for details).    MDM Rules/Calculators/A&P                          20 year old female with abdominal pain and syncope.  EKG showed borderline sinus tachycardia with no signs of arrhythmia, no delta waves, no acute ischemic changes.  Exam as above.  She does have colicky left lower quadrant pain, which makes me concerned for ovarian torsion.  Ultrasound obtained and showed normal-appearing ovaries.  No free pelvic fluid.  Low concern for hemorrhagic cyst rupture.  Pelvic ultrasound refused.  Her beta hCG is negative.  Low concern for ectopic pregnancy.  CBC showed normal white count.  She is not tachycardic or febrile.  Low concern for sepsis.  Lipase normal.  Low concern for pancreatitis.  Electrolytes normal.  Creatinine normal.  COVID and flu negative.  Patient reassessed.  Still with persistent abdominal tenderness, no rebound or guarding.  CT abdomen pelvis obtained.  No acute  intra-abdominal pathology noted.  Patient reassessed.  Resting comfortably.  Pain improved.  She is tolerating p.o. she is  appropriate for discharge home with close follow-up with PCP.  Encouraged symptomatic management at home with Tylenol Motrin.  Strict return precautions provided.   Final Clinical Impression(s) / ED Diagnoses Final diagnoses:  LLQ abdominal pain    Rx / DC Orders ED Discharge Orders     None        Idamae Lusher, MD 11/04/21 [id number]    Margette Fast, MD 11/05/21 [phone]

## 2021-11-04 NOTE — ED Triage Notes (Signed)
Pt having abd pain x 3 days, worse since 7pm. Pt appears very uncomfortable on arrival, then more comfortable at present. LMP last month, denies pregnancy or possibility of pregnancy. Abd tender on palpation, worse in LLQ.   While waiting for triage, pt had a syncopal episode. C-collar placed postfall

## 2021-11-14 ENCOUNTER — Ambulatory Visit: Payer: Medicaid Other | Admitting: Adult Health

## 2021-11-14 ENCOUNTER — Encounter: Payer: Self-pay | Admitting: Adult Health

## 2021-11-14 VITALS — BP 100/62 | HR 89 | Ht 64.0 in | Wt 124.0 lb

## 2021-11-14 DIAGNOSIS — G43109 Migraine with aura, not intractable, without status migrainosus: Secondary | ICD-10-CM | POA: Diagnosis not present

## 2021-11-14 DIAGNOSIS — R55 Syncope and collapse: Secondary | ICD-10-CM

## 2021-11-14 DIAGNOSIS — N922 Excessive menstruation at puberty: Secondary | ICD-10-CM

## 2021-11-14 DIAGNOSIS — N920 Excessive and frequent menstruation with regular cycle: Secondary | ICD-10-CM | POA: Diagnosis not present

## 2021-11-14 NOTE — Patient Instructions (Signed)
Your Plan:  Use of Elyxyb as needed for migraine relief - take at onset of migraine headache  Continue to follow with your OBGYN as scheduled    Follow up in 6 months or call earlier if needed     Thank you for coming to see [place] at [facility]. I hope we have been able to provide you high quality care today.  You may receive a patient satisfaction survey over the next few weeks. We would appreciate your feedback and comments so that we may continue to improve ourselves and the health of our patients.

## 2021-11-14 NOTE — Progress Notes (Signed)
[facility] [address] [place]. [place] [address] [phone]       OFFICE FOLLOW UP NOTE  Ms. Dawnisha Marquina Date of Birth:  Oct 05, 2001 Medical Record Number:  [id number]   Referring MD: Jonah Blue  Reason for Referral: Syncope, headache  Chief Complaint  Patient presents with   Follow-up    Rm 2 (has [facility] interpreter) Pt is well and stable, she has been doing good. No concerns       HPI:   Miss. Fister is a 20 year old female with PMH of syncopal events, headaches, insomnia and anxiety.  She was previously seen by Dr. Frances Furbish on 03/21/2021 and initial visit with Dr. Pearlean Brownie on 01/18/2021 for recurrent episodes of passing out over the past 6 months.  Per Dr. Pearlean Brownie "These episodes are quite stereotypical. The patient complains of a headache before the episode and then states she wants to lie down.  She is quite unresponsive and even when water is splashed on her face she does not respond.  Episodes have lasted about 10 minutes to about an hour or 2". Of note, mother had similar symptoms and passed away from ruptured aneurysm.  MR brain and MRA head/neck unremarkable. EEG negative.  Cardiology recommended cardiac monitor for episodes of dizziness associated with severe headache, nausea and palpitations.  8 day monitor completed 05/2021 which was negative for any significant arrhythmias. She has establish care with OB/GYN for irregular and heavy menses recently started on estrogen birth control that she reports was just started Sunday 8/14.she continues to follow with psychiatry for anxiety.  She was prescribed amitriptyline in 11/2020 for headache, anxiety and insomnia during evaluation for syncopal event.  Update 07/14/2021 JM:  Reports she has been doing well since prior visit 4 months ago.  She has not had any additional syncopal events.  Reports improvement of headaches currently occurring approx 1x weekly.  Intermittent use of amitriptyline -currently prescribed 50 mg  nightly but will only take at night if she has a headache.  Headache will typically start in the morning and lasts all day but after taking amitriptyline prior to bed, headache will be gone in the morning.  These are typically located on right side with throbbing sensation associated with photophobia and phonophobia and occasionally with dizziness and nausea.  Prior to headache onset, reports vision changes with both positive and negative symptoms.  Last migraine headache on Tuesday (8/14).  Reports over the past 7 months, noticed her hair falling out more. Denies actual bald spots. Does report dry, flaky scalp.  No further concerns at this time.  Update 11/14/2021 JM: Returns for 4 month follow-up accompanied by [facility] interpreter.  Recent syncopal event on 12/9 in ED waiting room - presented for lower abdominal pain in setting of menses (per patient at todays visit).  EKG borderline sinus tachycardia but no evidence of arrhythmia or any other acute findings.  All other work-up unremarkable.  Has long standing issues of heavy menses. She was rx'd OCP by her OBGYN but did not take as she is concerned of potential side effects. No additional syncopal events.  She has not had any recent migraine headaches (unable to state when last migraine occurred). Currently prescribed celecoxib PRN for acute migraine relief but has not needed to use.  Insurance denied diclofenac.  No longer on amitriptyline as anxiety and headaches improved.  Continues to follow with psychiatry for anxiety currently on mirtazapine.  No further concerns at this time.  ROS:   14 system review of systems is positive for those listed in HPI all other systems negative  PMH:  Past Medical History:  Diagnosis Date   GERD (gastroesophageal reflux disease)     Social History:  Social History   Socioeconomic History   Marital status: Single    Spouse name: Not on file   Number of children: Not on file   Years of education: Not on  file   Highest education level: Not on file  Occupational History   Occupation: Consulting civil engineer  Tobacco Use   Smoking status: Never   Smokeless tobacco: Never  Vaping Use   Vaping Use: Never used  Substance and Sexual Activity   Alcohol use: Never   Drug use: Never   Sexual activity: Never  Other Topics Concern   Not on file  Social History Narrative   Lives with dad and 6 other siblings   Right handed   Drinks caffeine rarely   Social Determinants of Health   Financial Resource Strain: Not on file  Food Insecurity: Not on file  Transportation Needs: Not on file  Physical Activity: Not on file  Stress: Not on file  Social Connections: Not on file  Intimate Partner Violence: Not on file    Medications:   Current Outpatient Medications on File Prior to Visit  Medication Sig Dispense Refill   ALPRAZolam (XANAX) 0.25 MG tablet Take 0.25 mg by mouth at bedtime as needed for anxiety.     Celecoxib (ELYXYB) 120 MG/4.8ML SOLN Take 120 mg by mouth daily as needed (for acute migraine relief). 14 mL 5   clobetasol (TEMOVATE) 0.05 % external solution Apply 1 application topically 2 (two) times daily. Mix whole bottle in 1 tub of cerave cream and use on legs bid until rash clear, avoid face, groin, axilla 50 mL 0   clobetasol cream (TEMOVATE) 0.05 % Apply 1 application topically 2 (two) times daily. Apply to rash on left arm bid until clear, avoid face, groin, axilla 30 g 0   No current facility-administered medications on file prior to visit.    Allergies:  No Known Allergies  Physical Exam Today's Vitals   11/14/21 0904  BP: 100/62  Pulse: 89  Weight: 124 lb (56.2 kg)  Height: 5\' 4"  (1.626 m)    Body mass index is 21.28 kg/m.  General: well developed, well nourished very pleasant young female, seated, in no evident distress Head: head normocephalic and atraumatic.   Neck: supple with no carotid or supraclavicular bruits Cardiovascular: regular rate and rhythm, no  murmurs Musculoskeletal: no deformity Skin:  no rash/petichiae Vascular:  Normal pulses all extremities  Neurologic Exam limited due to language barrier Mental Status: Awake and fully alert. Oriented to place and time. Recent and remote memory intact. Attention span, concentration and fund of knowledge appropriate. Mood and affect appropriate.  Cranial Nerves: Pupils equal, briskly reactive to light. Extraocular movements full without nystagmus. Visual fields full to confrontation. Hearing intact. Facial sensation intact. Face, tongue, palate moves normally and symmetrically.  Motor: Normal bulk and tone. Normal strength in all tested extremity muscles. Sensory.: intact to touch , pinprick , position and vibratory sensation.  Coordination: Rapid alternating movements normal in all extremities. Finger-to-nose and heel-to-shin performed accurately bilaterally. Gait and Station: Arises from chair without difficulty. Stance is normal. Gait demonstrates normal stride length and balance . Able to heel, toe and tandem walk without difficulty.  Reflexes: 1+ and symmetric. Toes downgoing.  ASSESSMENT/PLAN: 20 year old African girl with recurrent episodes of transient loss of consciousness and unresponsiveness of unclear etiology.  Possibly nonepileptic spells triggered by stress.  Extensive work-up neurologically and cardiac unremarkable.  Also ongoing headaches with reported symptoms consistent with migraine with aura    Syncope Event 12/9 while waiting in ED for concern of lower abdominal pain in setting of menses - all workup unremarkable -no additional events Cardiac monitor negative MR brain and MRA head/neck unremarkable EEG negative per cards, suspected vasovagal etiology continue to monitor  Migraine with aura No recent migraine headache Use of celecoxib as needed for acute migraine relief (insurance denied diclofenac) Prior - Hesitant to trial triptans due to possible side  effects with already migraine associated symptoms of dizziness and nausea MR brain unremarkable  Heavy menses Ongoing issue followed by OBGYN Previously prescribed OCP but self discontinued - advised to reach out to OBGYN to discussed other treatment options. Would avoid use of estrogen containing BC due to migraine with auras    Follow-up in 6 months or call earlier if needed.    CC:  Barbette Merino, NP   I spent 28 minutes of face-to-face and non-face-to-face time with patient and [place] interpreter.  This included previsit chart review, lab review, study review, electronic health record documentation, patient education and discussion regarding prior syncopal events, migraine with aura and further treatment options, medication use and potential side effects, heavy menses as noted above and answered all other questions to patient's satisfaction  Ihor Austin, AGNP-BC  [facility] [address] [place], [place] [address]  Phone [phone] Fax [phone] Note: This document was prepared with digital dictation and possible smart phrase technology. Any transcriptional errors that result from this process are unintentional.

## 2021-11-23 ENCOUNTER — Ambulatory Visit (INDEPENDENT_AMBULATORY_CARE_PROVIDER_SITE_OTHER): Payer: Medicaid Other | Admitting: Dermatology

## 2021-11-23 ENCOUNTER — Other Ambulatory Visit: Payer: Self-pay

## 2021-11-23 DIAGNOSIS — L700 Acne vulgaris: Secondary | ICD-10-CM | POA: Diagnosis not present

## 2021-11-23 DIAGNOSIS — L239 Allergic contact dermatitis, unspecified cause: Secondary | ICD-10-CM

## 2021-11-23 MED ORDER — HYDROXYZINE HCL 10 MG PO TABS: ORAL_TABLET | ORAL | 1 refills | Status: DC

## 2021-11-23 MED ORDER — ELIDEL 1 % EX CREA
TOPICAL_CREAM | CUTANEOUS | 2 refills | Status: DC
Start: 2021-11-23 — End: 2022-01-31

## 2021-11-23 MED ORDER — DIFFERIN 0.3 % EX GEL
CUTANEOUS | 3 refills | Status: DC
Start: 2021-11-23 — End: 2022-03-02

## 2021-11-23 MED ORDER — LEVOCETIRIZINE DIHYDROCHLORIDE 5 MG PO TABS: 5.0000 mg | ORAL_TABLET | Freq: Every morning | ORAL | 3 refills | Status: DC

## 2021-11-23 NOTE — Progress Notes (Signed)
Follow-Up Visit   Subjective  Bethany Harris is a 20 y.o. female who presents for the following: Follow-up.  Patient here for 3 week follow-up allergic contact dermatitis, trigger to metal. She states that she was improved while taking Prednisone, hydroxyzine. She has also finished clobetasol cream and clobetasol solution/CeraVe mix. She has worsened some since off all of her meds. Has itchy bumps on face, and legs are also itching after using hair removal cream.  The following portions of the chart were reviewed this encounter and updated as appropriate:       Review of Systems:  No other skin or systemic complaints except as noted in HPI or Assessment and Plan.  Objective  Well appearing patient in no apparent distress; mood and affect are within normal limits.  A focused examination was performed including face, extremities, trunk. Relevant physical exam findings are noted in the Assessment and Plan.  legs, trunk Legs and trunk with xerosis and hyperpigmented macules  face, chest Closed comedones, some inflamed on the cheeks, forehead; inflamed comedones on the chest.    Assessment & Plan   Allergic contact dermatitis, unspecified trigger legs, trunk  Probable metal trigger- ID rxn with dermatographism improved post prednisone taper, some residual mild pruritus secondary to xerosis   Recommend mild soap and moisturizing cream 1-2 times daily.  Gentle skin care handout provided. CeraVe Cream apply all over daily after shower. May restart Clob/CeraVe mix qd/bid prn flares D/C topical depilatory cream on legs since causing increase in itching, change to electric shaver.   Start Elidel Cream Apply qd/bid AA face and body prn itchy rash dsp 100g 2Rf.   start Xyzal 5mg  take 1 po QD (pt didn't get from last visit) Continue hydroxyzine 10mg  take 1 to 3 tablets qhs prn itch  ELIDEL 1 % cream - legs, trunk Apply to affected areas face/body 1-2 times a day as needed for itchy  rash.  levocetirizine (XYZAL) 5 MG tablet - legs, trunk Take 1 tablet (5 mg total) by mouth in the morning.  Related Medications clobetasol cream (TEMOVATE) 0.05 % Apply 1 application topically 2 (two) times daily. Apply to rash on left arm bid until clear, avoid face, groin, axilla  clobetasol (TEMOVATE) 0.05 % external solution Apply 1 application topically 2 (two) times daily. Mix whole bottle in 1 tub of cerave cream and use on legs bid until rash clear, avoid face, groin, axilla  Acne vulgaris face, chest  Comedonal Start Differin 0.3% Gel Apply a pea-sized amount to face at night as tolerated for acne dsp 45g 3Rf.   Topical retinoid medications like tretinoin/Retin-A, adapalene/Differin, tazarotene/Fabior, and Epiduo/Epiduo Forte can cause dryness and irritation when first started. Only apply a pea-sized amount to the entire affected area. Avoid applying it around the eyes, edges of mouth and creases at the nose. If you experience irritation, use a good moisturizer first and/or apply the medicine less often. If you are doing well with the medicine, you can increase how often you use it until you are applying every night. Be careful with sun protection while using this medication as it can make you sensitive to the sun. This medicine should not be used by pregnant women.    DIFFERIN 0.3 % gel - face, chest Apply a pea-sized amount to face and chest.   Return in about 10 weeks (around 02/01/2022) for acne.  , CMA, am acting as scribe for 04/03/2022, MD . Documentation: I have reviewed the above documentation for  accuracy and completeness, and I agree with the above.  Brendolyn Patty MD

## 2021-11-23 NOTE — Patient Instructions (Addendum)
Differin 0.3% Gel - Apply a pea-sized amount to face at night as tolerated for acne. May apply CeraVe Cream to face before applying Differin Gel.   Topical retinoid medications like tretinoin/Retin-A, adapalene/Differin, tazarotene/Fabior, and Epiduo/Epiduo Forte can cause dryness and irritation when first started. Only apply a pea-sized amount to the entire affected area. Avoid applying it around the eyes, edges of mouth and creases at the nose. If you experience irritation, use a good moisturizer first and/or apply the medicine less often. If you are doing well with the medicine, you can increase how often you use it until you are applying every night. Be careful with sun protection while using this medication as it can make you sensitive to the sun. This medicine should not be used by pregnant women.   Elidel Cream - Apply 1-2 times a day to itchy rash on face and body as needed.   Xyzal (antihistamine) - take 1 pill every morning  Hydroxyzine 10mg  - take 1-3 tablets by mouth at night as needed for itching. May make drowsy.   Gentle Skin Care Guide  1. Bathe no more than once a day.  2. Avoid bathing in hot water  3. Use a mild soap like Dove, Vanicream, Cetaphil, CeraVe. Can use Lever 2000 or Cetaphil antibacterial soap  4. Use soap only where you need it. On most days, use it under your arms, between your legs, and on your feet. Let the water rinse other areas unless visibly dirty.  5. When you get out of the bath/shower, use a towel to gently blot your skin dry, don't rub it.  6. While your skin is still a little damp, apply a moisturizing cream such as Vanicream, CeraVe, Cetaphil, Eucerin, Sarna lotion or plain Vaseline Jelly. For hands apply Neutrogena Hand Cream or Excipial Hand Cream.  7. Reapply moisturizer any time you start to itch or feel dry.  8. Sometimes using free and clear laundry detergents can be helpful. Fabric softener sheets should be avoided. Downy Free &  Gentle liquid, or any liquid fabric softener that is free of dyes and perfumes, it acceptable to use  9. If your doctor has given you prescription creams you may apply moisturizers over them    If You Need Anything After Your Visit  If you have any questions or concerns for your doctor, please call our main line at [phone] and press option 4 to reach your doctor's medical assistant. If no one answers, please leave a voicemail as directed and we will return your call as soon as possible. Messages left after 4 pm will be answered the following business day.   You may also send [phone] a message via MyChart. We typically respond to MyChart messages within 1-2 business days.  For prescription refills, please ask your pharmacy to contact our office. Our fax number is [phone].  If you have an urgent issue when the clinic is closed that cannot wait until the next business day, you can page your doctor at the number below.    Please note that while we do our best to be available for urgent issues outside of office hours, we are not available 24/7.   If you have an urgent issue and are unable to reach [phone], you may choose to seek medical care at your doctor's office, retail clinic, urgent care center, or emergency room.  If you have a medical emergency, please immediately call 911 or go to the emergency department.  Pager Numbers  -  Dr. Nehemiah Massed: [phone]  - Dr. Laurence Ferrari: [phone]  - Dr. Nicole Kindred: [phone]  In the event of inclement weather, please call our main line at [phone] for an update on the status of any delays or closures.  Dermatology Medication Tips: Please keep the boxes that topical medications come in in order to help keep track of the instructions about where and how to use these. Pharmacies typically print the medication instructions only on the boxes and not directly on the medication tubes.   If your medication is too expensive, please contact our office at  [phone] option 4 or send [place] a message through [place].   We are unable to tell what your co-pay for medications will be in advance as this is different depending on your insurance coverage. However, we may be able to find a substitute medication at lower cost or fill out paperwork to get insurance to cover a needed medication.   If a prior authorization is required to get your medication covered by your insurance company, please allow [place] 1-2 business days to complete this process.  Drug prices often vary depending on where the prescription is filled and some pharmacies may offer cheaper prices.  The website www.goodrx.com contains coupons for medications through different pharmacies. The prices here do not account for what the cost may be with help from insurance (it may be cheaper with your insurance), but the website can give you the price if you did not use any insurance.  - You can print the associated coupon and take it with your prescription to the pharmacy.  - You may also stop by our office during regular business hours and pick up a GoodRx coupon card.  - If you need your prescription sent electronically to a different pharmacy, notify our office through [facility] or by phone at [phone] option 4.     Si Usted Necesita Algo Despus de Su Visita  Tambin puede enviarnos un mensaje a travs de Pharmacist, community. Por lo general respondemos a los mensajes de MyChart en el transcurso de 1 a 2 das hbiles.  Para renovar recetas, por favor pida a su farmacia que se ponga en contacto con nuestra oficina. Harland Dingwall de fax es [place] [phone].  Si tiene un asunto urgente cuando la clnica est cerrada y que no puede esperar hasta el siguiente da hbil, puede llamar/localizar a su doctor(a) al nmero que aparece a continuacin.   Por favor, tenga en cuenta que aunque hacemos todo lo posible para estar disponibles para asuntos urgentes fuera del horario de [place], no estamos  disponibles las 24 horas del da, los 7 das de la [place].   Si tiene un problema urgente y no puede comunicarse con nosotros, puede optar por buscar atencin mdica  en el consultorio de su doctor(a), en una clnica privada, en un centro de atencin urgente o en una sala de emergencias.  Si tiene Engineering geologist, por favor llame inmediatamente al 911 o vaya a la sala de emergencias.  Nmeros de bper  - Dr. Nehemiah Massed: [phone]  - Dra. Moye: [phone]  - Dra. Nicole Kindred: [phone]  En caso de inclemencias del [place], por favor llame a Johnsie Kindred principal al [phone] para una actualizacin sobre el [place] de cualquier retraso o cierre.  Consejos para la medicacin en dermatologa: Por favor, guarde las cajas en las que vienen los medicamentos de uso tpico para ayudarle a seguir las instrucciones sobre dnde y cmo usarlos. [place] instrucciones del medicamento  slo en las cajas y no directamente en los tubos del medicamento.   Si su medicamento es muy caro, por favor, pngase en contacto con Zigmund Daniel llamando al [phone] y presione la opcin 4 o envenos un mensaje a travs de Pharmacist, community.   No podemos decirle cul ser su copago por los medicamentos por adelantado ya que esto es diferente dependiendo de la cobertura de su seguro. Sin embargo, es posible que podamos encontrar un medicamento sustituto a Electrical engineer un formulario para que el seguro cubra el medicamento que se considera necesario.   Si se requiere una autorizacin previa para que su compaa de seguros [place] su medicamento, por favor permtanos de 1 a 2 das hbiles para completar este proceso.  Los precios de los medicamentos varan con frecuencia dependiendo del Environmental consultant de dnde se surte la receta y alguna farmacias pueden ofrecer precios ms baratos.  El sitio web www.goodrx.com tiene cupones para medicamentos de Airline pilot. Los precios aqu no  tienen en cuenta lo que podra costar con la ayuda del seguro (puede ser ms barato con su seguro), pero el sitio web puede darle el precio si no utiliz Research scientist (physical sciences).  - Puede imprimir el cupn correspondiente y llevarlo con su receta a la farmacia.  - Tambin puede pasar por nuestra oficina durante el horario de atencin regular y Charity fundraiser una tarjeta de cupones de GoodRx.  - Si necesita que su receta se enve electrnicamente a una farmacia diferente, informe a nuestra oficina a travs de MyChart de [place] o por telfono llamando al [phone] y presione la opcin 4.

## 2021-12-14 ENCOUNTER — Telehealth (INDEPENDENT_AMBULATORY_CARE_PROVIDER_SITE_OTHER): Payer: Medicaid Other | Admitting: Psychiatry

## 2021-12-14 ENCOUNTER — Encounter (HOSPITAL_COMMUNITY): Payer: Self-pay | Admitting: Psychiatry

## 2021-12-14 DIAGNOSIS — F325 Major depressive disorder, single episode, in full remission: Secondary | ICD-10-CM

## 2021-12-14 HISTORY — DX: Major depressive disorder, single episode, in full remission: F32.5

## 2021-12-14 NOTE — Progress Notes (Signed)
BH MD/PA/NP OP Progress Note Virtual Visit via Video Note  I connected with Bethany Harris on 12/14/21 at  1:00 PM EST by a video enabled telemedicine application and verified that I am speaking with the correct person using two identifiers.  Location: Patient: Home Provider: Clinic   I discussed the limitations of evaluation and management by telemedicine and the availability of in person appointments. The patient expressed understanding and agreed to proceed.  I provided 30 minutes of non-face-to-face time during this encounter.   12/14/2021 1:17 PM Bethany Harris  MRN:  [id number]  Chief Complaint: "I am doing very well"    HPI: 21 year old female seen today for follow up psychiatric evaluation. The patient has a history of anxiety and depression. She is currently managed on Mirtazapine 15 nightly. She notes that she stopped taking Mirtazipine and is doing well.   Today she is well-groomed, pleasant, cooperative, engaged in conversation, and maintains eye contact. Provider utilized an interpreter since the patient speaks D[place]. She informed Clinical research associate that she is doing very well but notes that she has a poor appetite and has lost weight (noting that she went from 58 Kg to 55 kg). Patient notes that she took mirtazipine for 4 days and then discontinued it because it caused sedation. At his time she does not want to restart medications.Today she denies SI/HI/VAH, mania, or paranoia.   Patient reports that she completed her English courses and is now taking computer classes. She reports that things continue to progress with her fianc.  Mirtazapine not restarted to day. Patient will follow up as needed. No other concerns noted at this time.    Visit Diagnosis:    ICD-10-CM   1. 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  Family Psychiatric History: Denies  Family History: No family history on file.  Social History:  Social History   Socioeconomic History   Marital status: Single    Spouse name: Not on file   Number of children: Not on file   Years of education: Not on file   Highest education level: Not on file  Occupational History   Occupation: Consulting civil engineer  Tobacco Use   Smoking status: Never   Smokeless tobacco: Never  Vaping Use   Vaping Use: Never used  Substance and Sexual Activity   Alcohol use: Never   Drug use: Never   Sexual activity: Never  Other Topics Concern   Not on file  Social History Narrative   Lives with dad and 6 other siblings   Right handed   Drinks caffeine rarely   Social Determinants of Health   Financial Resource Strain: Not on file  Food Insecurity: Not on file  Transportation Needs: Not on file  Physical Activity: Not on file  Stress: Not on file  Social Connections: Not on file    Allergies: No Known Allergies  Metabolic Disorder Labs: No results found for: HGBA1C, MPG No results found for: PROLACTIN No results found for: CHOL, TRIG, HDL, CHOLHDL, VLDL, LDLCALC Lab Results  Component Value Date   TSH 1.140 01/03/2021    Therapeutic Level Labs: No results found for: LITHIUM No results found for: VALPROATE No components found for:  CBMZ  Current Medications: Current Outpatient Medications  Medication Sig Dispense Refill   ALPRAZolam Prudy Feeler)  0.25 MG tablet Take 0.25 mg by mouth at bedtime as needed for anxiety.     Celecoxib (ELYXYB) 120 MG/4.8ML SOLN Take 120 mg by mouth daily as needed (for acute migraine relief). 14 mL 5   clobetasol (TEMOVATE) 0.05 % external solution Apply 1 application topically 2 (two) times daily. Mix whole bottle in 1 tub of cerave cream and use on legs bid until rash clear, avoid face, groin, axilla 50 mL 0   clobetasol cream (TEMOVATE) 0.05 % Apply 1 application topically 2 (two) times  daily. Apply to rash on left arm bid until clear, avoid face, groin, axilla 30 g 0   DIFFERIN 0.3 % gel Apply a pea-sized amount to face and chest. 45 g 3   ELIDEL 1 % cream Apply to affected areas face/body 1-2 times a day as needed for itchy rash. 100 g 2   hydrOXYzine (ATARAX) 10 MG tablet Take 1 - 3 tablets by mouth at night as needed for itching. May make drowsy. 90 tablet 1   levocetirizine (XYZAL) 5 MG tablet Take 1 tablet (5 mg total) by mouth in the morning. 30 tablet 3   mirtazapine (REMERON) 15 MG tablet Take 15 mg by mouth at bedtime.     No current facility-administered medications for this visit.     Musculoskeletal: Strength & Muscle Tone:  Unable to assess due to telhealth visit Gait & Station:  Unable to assess due to telhealth visit Patient leans: N/A  Psychiatric Specialty Exam: Review of Systems  There were no vitals taken for this visit.There is no height or weight on file to calculate BMI.  General Appearance: Well Groomed  Eye Contact:  Good  Speech:  Clear and Coherent and Normal Rate  Volume:  Normal  Mood:  Euphoric  Affect:  Appropriate and Congruent  Thought Process:  Coherent, Goal Directed and Linear  Orientation:  Full (Time, Place, and Person)  Thought Content: WDL and Logical   Suicidal Thoughts:  No  Homicidal Thoughts:  No  Memory:  Immediate;   Good Recent;   Good Remote;   Good  Judgement:  Good  Insight:  Good  Psychomotor Activity:  Normal  Concentration:  Concentration: Good and Attention Span: Good  Recall:  Good  Fund of Knowledge: Good  Language: Good  Akathisia:  No  Handed:  Right  AIMS (if indicated): Not done  Assets:  Communication Skills Desire for Improvement Financial Resources/Insurance Housing Social Support  ADL's:  Intact  Cognition: WNL  Sleep:  Good   Screenings: GAD-7    Flowsheet Row Clinical Support from 09/28/2021 in [facility] Office Visit from 04/13/2021 in [facility] Office Visit from 03/04/2021 in [facility]  Total GAD-7 Score 4 19 11       PHQ2-9    Flowsheet Row Clinical Support from 09/28/2021 in [facility] Office Visit from 07/20/2021 in [facility] Office Visit from 04/13/2021 in [facility] Office Visit from 03/04/2021 in [facility] Office Visit from 01/14/2021 in [facility]  PHQ-2 Total Score 0 2 4 4 3   PHQ-9 Total Score 3 9 14 15 10       Flowsheet Row ED from 11/04/2021 in [facility] EMERGENCY DEPARTMENT Clinical Support from 09/28/2021 in [facility] from 08/15/2021 in [facility]  C-SSRS RISK CATEGORY  No Risk No Risk No Risk        Assessment and Plan: Patient notes that her anxiety and depression are well managed however reports that she has a poor appetite.  At this time she notes that she does not want to restart medications.  Patient will follow-up as needed. 1. Depression, major, in remission [facility](HCC)   Follow-up in as needed Shanna CiscoBrittney E Laneya Gasaway, NP 12/14/2021, 1:17 PM

## 2022-01-09 ENCOUNTER — Other Ambulatory Visit (HOSPITAL_COMMUNITY): Payer: Self-pay | Admitting: Psychiatry

## 2022-01-31 ENCOUNTER — Other Ambulatory Visit: Payer: Self-pay

## 2022-01-31 ENCOUNTER — Ambulatory Visit (INDEPENDENT_AMBULATORY_CARE_PROVIDER_SITE_OTHER): Payer: Medicaid Other | Admitting: Dermatology

## 2022-01-31 DIAGNOSIS — L700 Acne vulgaris: Secondary | ICD-10-CM | POA: Diagnosis not present

## 2022-01-31 DIAGNOSIS — L810 Postinflammatory hyperpigmentation: Secondary | ICD-10-CM | POA: Diagnosis not present

## 2022-01-31 DIAGNOSIS — L249 Irritant contact dermatitis, unspecified cause: Secondary | ICD-10-CM | POA: Diagnosis not present

## 2022-01-31 DIAGNOSIS — L239 Allergic contact dermatitis, unspecified cause: Secondary | ICD-10-CM | POA: Diagnosis not present

## 2022-01-31 MED ORDER — AKLIEF 0.005 % EX CREA: TOPICAL_CREAM | CUTANEOUS | 2 refills | Status: DC

## 2022-01-31 MED ORDER — ELIDEL 1 % EX CREA: TOPICAL_CREAM | CUTANEOUS | 2 refills | Status: DC

## 2022-01-31 NOTE — Progress Notes (Signed)
? ?  Follow-Up Visit ?  ?Subjective  ?Bethany Harris is a 21 y.o. female who presents for the following: Acne (Face. Has not been using Differin. Caused itching on face. Has been using a mask for acne, something she watched on YouTube. Acne has improved but now has dark spots).  She is itching on her chest. ? ? ? ?The following portions of the chart were reviewed this encounter and updated as appropriate:   ?  ? ?Review of Systems: No other skin or systemic complaints except as noted in HPI or Assessment and Plan. ? ? ?Objective  ?Well appearing patient in no apparent distress; mood and affect are within normal limits. ? ?A focused examination was performed including face, chest. Relevant physical exam findings are noted in the Assessment and Plan. ? ?face ?Multiple scattered open/closed comedones, some inflamed. Hyperpigmented macules on cheeks consistent with superficial scarring.  ? ?face, arms, legs, chest ?Scattered hyperpigmented macules, mild erythema on chest ? ? ?Assessment & Plan  ?Acne vulgaris ?face ? ?Comedonal, With PIH ?Chronic and persistent condition with duration or expected duration over one year. Condition is bothersome/symptomatic for patient. Currently flared. ? ?Stop Differin gel due to irritation ? ?Start Aklief cream at bedtime to face and chest as tolerated ? ?Dove soap, CeraVe AM/PM ? ?Topical retinoid medications like tretinoin/Retin-A, adapalene/Differin, tazarotene/Fabior, and Epiduo/Epiduo Forte can cause dryness and irritation when first started. Only apply a pea-sized amount to the entire affected area. Avoid applying it around the eyes, edges of mouth and creases at the nose. If you experience irritation, use a good moisturizer first and/or apply the medicine less often. If you are doing well with the medicine, you can increase how often you use it until you are applying every night. Be careful with sun protection while using this medication as it can make you sensitive to the sun. This  medicine should not be used by pregnant women.   ? ? ?Trifarotene (AKLIEF) 0.005 % CREA - face ?Apply to face and chest at bedtime, wash off in morning ? ?Related Medications ?DIFFERIN 0.3 % gel ?Apply a pea-sized amount to face and chest. ? ?Irritant contact dermatitis, unspecified trigger ?face, arms, legs, chest ? ?Improving with PIH and mild itching on chest likely due to irritation from adapalene gel. ? ?Use Pimecrolimus cream 1-2 times daily as needed for itching on face and chest. ? ?Recommend mild soap and moisturizing cream 1-2 times daily.  Gentle skin care handout provided.   ? ?Allergic contact dermatitis, unspecified trigger ? ?Related Medications ?clobetasol cream (TEMOVATE) 0.05 % ?Apply 1 application topically 2 (two) times daily. Apply to rash on left arm bid until clear, avoid face, groin, axilla ? ?clobetasol (TEMOVATE) 0.05 % external solution ?Apply 1 application topically 2 (two) times daily. Mix whole bottle in 1 tub of cerave cream and use on legs bid until rash clear, avoid face, groin, axilla ? ?levocetirizine (XYZAL) 5 MG tablet ?Take 1 tablet (5 mg total) by mouth in the morning. ? ?ELIDEL 1 % cream ?Apply to affected areas face/body 1-2 times a day as needed for itchy rash. ? ? ?Return in about 3 months (around 05/03/2022) for Acne Follow Up. ? ?I, Lawson Radar, CMA, am acting as scribe for Willeen Niece, MD. ? ?Documentation: I have reviewed the above documentation for accuracy and completeness, and I agree with the above. ? ?Willeen Niece MD  ? ?

## 2022-01-31 NOTE — Patient Instructions (Addendum)
Stop Differin gel. ? ?Start Aklief cream at bedtime to face and chest, wash off in morning. Start out using this every other night. Increase to every night as tolerated. ? ?Okay to use Pimecrolimus cream 1 to 2 times daily for itching on chest and face. ? ?Use CeraVe AM moisturizer with sunscreen in morning. Use CeraVe PM facial moisturizer at bedtime.  ? ?Topical retinoid medications like tretinoin/Retin-A, adapalene/Differin, tazarotene/Fabior, and Epiduo/Epiduo Forte can cause dryness and irritation when first started. Only apply a pea-sized amount to the entire affected area. Avoid applying it around the eyes, edges of mouth and creases at the nose. If you experience irritation, use a good moisturizer first and/or apply the medicine less often. If you are doing well with the medicine, you can increase how often you use it until you are applying every night. Be careful with sun protection while using this medication as it can make you sensitive to the sun. This medicine should not be used by pregnant women.   ? ?Recommend daily broad spectrum sunscreen SPF 30+ to sun-exposed areas, reapply every 2 hours as needed. Call for new or changing lesions.  ?Staying in the shade or wearing long sleeves, sun glasses (UVA+UVB protection) and wide brim hats (4-inch brim around the entire circumference of the hat) are also recommended for sun protection.  ? ? ? ?If You Need Anything After Your Visit ? ?If you have any questions or concerns for your doctor, please call our main line at [phone] and press option 4 to reach your doctor's medical assistant. If no one answers, please leave a voicemail as directed and we will return your call as soon as possible. Messages left after 4 pm will be answered the following business day.  ? ?You may also send [place] a message via MyChart. We typically respond to MyChart messages within 1-2 business days. ? ?For prescription refills, please ask your pharmacy to contact our office. Our fax  number is [phone]. ? ?If you have an urgent issue when the clinic is closed that cannot wait until the next business day, you can page your doctor at the number below.   ? ?Please note that while we do our best to be available for urgent issues outside of office hours, we are not available 24/7.  ? ?If you have an urgent issue and are unable to reach [place], you may choose to seek medical care at your doctor's office, retail clinic, urgent care center, or emergency room. ? ?If you have a medical emergency, please immediately call 911 or go to the emergency department. ? ?Pager Numbers ? ?- Dr. Gwen Pounds: [phone] ? ?- Dr. Neale Burly: [phone] ? ?- Dr. Roseanne Reno: [phone] ? ?In the event of inclement weather, please call our main line at [phone] for an update on the status of any delays or closures. ? ?Dermatology Medication Tips: ?Please keep the boxes that topical medications come in in order to help keep track of the instructions about where and how to use these. Pharmacies typically print the medication instructions only on the boxes and not directly on the medication tubes.  ? ?If your medication is too expensive, please contact our office at [phone] option 4 or send [place] a message through MyChart.  ? ?We are unable to tell what your co-pay for medications will be in advance as this is different depending on your insurance coverage. However, we may be able to find a substitute medication at lower cost or fill out paperwork to get insurance  to cover a needed medication.  ? ?If a prior authorization is required to get your medication covered by your insurance company, please allow [place] 1-2 business days to complete this process. ? ?Drug prices often vary depending on where the prescription is filled and some pharmacies may offer cheaper prices. ? ?The website www.goodrx.com contains coupons for medications through different pharmacies. The prices here do not account for what the cost may be with help  from insurance (it may be cheaper with your insurance), but the website can give you the price if you did not use any insurance.  ?- You can print the associated coupon and take it with your prescription to the pharmacy.  ?- You may also stop by our office during regular business hours and pick up a GoodRx coupon card.  ?- If you need your prescription sent electronically to a different pharmacy, notify our office through [facility] or by phone at [phone] option 4. ? ? ? ? ?Si Usted Necesita Algo Despu?s de Su Visita ? ?Tambi?n puede enviarnos un mensaje a trav?s de MyChart. Por lo general respondemos a los mensajes de MyChart en el transcurso de 1 a 2 d?as h?biles. ? ?Para renovar recetas, por favor pida a su farmacia que se ponga en contacto con nuestra oficina. Nuestro n?mero de fax es el [phone]. ? ?Si tiene un asunto urgente cuando la cl?nica est? cerrada y que no puede esperar hasta el siguiente d?a h?bil, puede llamar/localizar a su doctor(a) al n?mero que aparece a continuaci?n.  ? ?Por favor, tenga en cuenta que aunque hacemos todo lo posible para estar disponibles para asuntos urgentes fuera del horario de oficina, no estamos disponibles las 24 horas del d?a, los 7 d?as de la semana.  ? ?Si tiene un problema urgente y no puede comunicarse con nosotros, puede optar por buscar atenci?n m?dica  en el consultorio de su doctor(a), en una cl?nica privada, en un centro de atenci?n urgente o en una sala de emergencias. ? ?Si tiene Radio broadcast assistant m?dica, por favor llame inmediatamente al 911 o vaya a la sala de emergencias. ? ?N?meros de b?per ? ?- Dr. Gwen Pounds: [phone] ? ?- Dra. Moye: [phone] ? ?- Dra. Roseanne Reno: [phone] ? ?En caso de inclemencias del tiempo, por favor llame a nuestra l?nea principal al [phone] para una actualizaci?n sobre el estado de cualquier retraso o cierre. ? ?Consejos para la medicaci?n en dermatolog?a: ?Por favor, guarde las cajas en las que vienen los  medicamentos de uso t?pico para ayudarle a seguir las instrucciones sobre d?nde y c?mo usarlos. Las farmacias generalmente imprimen las instrucciones del medicamento s?lo en las cajas y no directamente en los tubos del [place].  ? ?Si su medicamento es muy caro, por favor, p?ngase en contacto con Rolm Gala llamando al [phone] y presione la opci?n 4 o env?enos un mensaje a trav?s de MyChart.  ? ?No podemos decirle cu?l ser? su copago por los medicamentos por adelantado ya que esto es diferente dependiendo de la cobertura de su seguro. Sin embargo, es posible que podamos encontrar un medicamento sustituto a Audiological scientist un formulario para que el seguro cubra el medicamento que se considera necesario.  ? ?Si se requiere Neomia Dear autorizaci?n previa para que su compa??a de seguros [place] su medicamento, por favor perm?tanos de 1 a 2 d?as h?biles para completar este proceso. ? ?Los precios de los medicamentos var?an con frecuencia dependiendo del Environmental consultant de d?nde se surte la receta y alguna farmacias pueden ofrecer precios  m?s baratos. ? ?El sitio web www.goodrx.com tiene cupones para medicamentos de Health and safety inspector. Los precios aqu? no tienen en cuenta lo que podr?a costar con la ayuda del seguro (puede ser m?s barato con su seguro), pero el sitio web puede darle el precio si no utiliz? ning?n seguro.  ?- Puede imprimir el cup?n correspondiente y llevarlo con su receta a la farmacia.  ?- Tambi?n puede pasar por nuestra oficina durante el horario de atenci?n regular y recoger una tarjeta de cupones de GoodRx.  ?- Si necesita que su receta se env?e electr?nicamente a Psychiatrist, informe a nuestra oficina a trav?s de MyChart de [place] o por tel?fono llamando al [phone] y presione la opci?n 4.  ?

## 2022-03-02 ENCOUNTER — Emergency Department (HOSPITAL_COMMUNITY)
Admission: EM | Admit: 2022-03-02 | Discharge: 2022-03-02 | Disposition: A | Discharge status: Home / Self Care | Payer: Medicaid Other | Attending: Emergency Medicine | Admitting: Emergency Medicine

## 2022-03-02 ENCOUNTER — Ambulatory Visit (INDEPENDENT_AMBULATORY_CARE_PROVIDER_SITE_OTHER): Payer: Medicaid Other | Admitting: Nurse Practitioner

## 2022-03-02 ENCOUNTER — Encounter (HOSPITAL_COMMUNITY): Payer: Self-pay

## 2022-03-02 ENCOUNTER — Encounter: Payer: Self-pay | Admitting: Nurse Practitioner

## 2022-03-02 ENCOUNTER — Other Ambulatory Visit: Payer: Self-pay

## 2022-03-02 ENCOUNTER — Emergency Department (HOSPITAL_COMMUNITY): Payer: Medicaid Other

## 2022-03-02 VITALS — BP 121/85 | HR 88 | Temp 98.8°F | Ht 63.0 in | Wt 119.0 lb

## 2022-03-02 DIAGNOSIS — N939 Abnormal uterine and vaginal bleeding, unspecified: Secondary | ICD-10-CM | POA: Diagnosis not present

## 2022-03-02 DIAGNOSIS — R531 Weakness: Secondary | ICD-10-CM | POA: Diagnosis not present

## 2022-03-02 DIAGNOSIS — N946 Dysmenorrhea, unspecified: Secondary | ICD-10-CM

## 2022-03-02 DIAGNOSIS — R102 Pelvic and perineal pain: Secondary | ICD-10-CM

## 2022-03-02 DIAGNOSIS — R109 Unspecified abdominal pain: Secondary | ICD-10-CM | POA: Diagnosis present

## 2022-03-02 LAB — CBC WITH DIFFERENTIAL/PLATELET
Abs Immature Granulocytes: 0.02 10*3/uL (ref 0.00–0.07)
Basophils Absolute: 0 10*3/uL (ref 0.0–0.1)
Basophils Relative: 1 %
Eosinophils Absolute: 0.1 10*3/uL (ref 0.0–0.5)
Eosinophils Relative: 2 %
HCT: 40.1 % (ref 36.0–46.0)
Hemoglobin: 13.5 g/dL (ref 12.0–15.0)
Immature Granulocytes: 0 %
Lymphocytes Relative: 28 %
Lymphs Abs: 1.8 10*3/uL (ref 0.7–4.0)
MCH: 28.7 pg (ref 26.0–34.0)
MCHC: 33.7 g/dL (ref 30.0–36.0)
MCV: 85.3 fL (ref 80.0–100.0)
Monocytes Absolute: 0.4 10*3/uL (ref 0.1–1.0)
Monocytes Relative: 7 %
Neutro Abs: 4.1 10*3/uL (ref 1.7–7.7)
Neutrophils Relative %: 62 %
Platelets: 155 10*3/uL (ref 150–400)
RBC: 4.7 MIL/uL (ref 3.87–5.11)
RDW: 11.9 % (ref 11.5–15.5)
WBC: 6.5 10*3/uL (ref 4.0–10.5)
nRBC: 0 % (ref 0.0–0.2)

## 2022-03-02 LAB — COMPREHENSIVE METABOLIC PANEL
ALT: 13 U/L (ref 0–44)
AST: 15 U/L (ref 15–41)
Albumin: 4.6 g/dL (ref 3.5–5.0)
Alkaline Phosphatase: 66 U/L (ref 38–126)
Anion gap: 3 — ABNORMAL LOW (ref 5–15)
BUN: 12 mg/dL (ref 6–20)
CO2: 25 mmol/L (ref 22–32)
Calcium: 9.3 mg/dL (ref 8.9–10.3)
Chloride: 111 mmol/L (ref 98–111)
Creatinine, Ser: 0.77 mg/dL (ref 0.44–1.00)
GFR, Estimated: >60 mL/min (ref >60)
Glucose, Bld: 99 mg/dL (ref 70–99)
Potassium: 4.1 mmol/L (ref 3.5–5.1)
Sodium: 139 mmol/L (ref 135–145)
Total Bilirubin: 0.5 mg/dL (ref 0.3–1.2)
Total Protein: 7.4 g/dL (ref 6.5–8.1)

## 2022-03-02 LAB — URINALYSIS, ROUTINE W REFLEX MICROSCOPIC
Bilirubin Urine: NEGATIVE
Glucose, UA: NEGATIVE mg/dL
Ketones, ur: NEGATIVE mg/dL
Leukocytes,Ua: NEGATIVE
Nitrite: NEGATIVE
Protein, ur: NEGATIVE mg/dL
Specific Gravity, Urine: 1.009 (ref 1.005–1.030)
pH: 6 (ref 5.0–8.0)

## 2022-03-02 LAB — I-STAT BETA HCG BLOOD, ED (NOT ORDERABLE): I-stat hCG, quantitative: <5 m[IU]/mL (ref <5)

## 2022-03-02 LAB — CBG MONITORING, ED: Glucose-Capillary: 112 mg/dL — ABNORMAL HIGH (ref 70–99)

## 2022-03-02 MED ORDER — KETOROLAC TROMETHAMINE 15 MG/ML IJ SOLN
15.0000 mg | Freq: Once | INTRAMUSCULAR | Status: AC
  Administered 2022-03-02: 15 mg via INTRAVENOUS
  Filled 2022-03-02: qty 1

## 2022-03-02 MED ORDER — SODIUM CHLORIDE 0.9 % IV BOLUS
1000.0000 mL | Freq: Once | INTRAVENOUS | Status: AC
Start: 2022-03-02 — End: 2022-03-02
  Administered 2022-03-02: 1000 mL via INTRAVENOUS

## 2022-03-02 MED ORDER — IOHEXOL 300 MG/ML  SOLN
80.0000 mL | Freq: Once | INTRAMUSCULAR | Status: AC | PRN
  Administered 2022-03-02: 80 mL via INTRAVENOUS

## 2022-03-02 NOTE — ED Notes (Signed)
I STAT Beta: <0.5 ?

## 2022-03-02 NOTE — Progress Notes (Signed)
? ? [facility] ?[address] ?[place], [place]  [address] ?Phone:  [phone]   Fax:  [phone] ?Subjective:  ? Patient ID: Bethany Harris, female    DOB: [birth year], 21 y.o.   MRN: [id number] ? ?Chief Complaint  ?Patient presents with  ? Dysmenorrhea  ?  Patient is here today due to severe menstrual cycle pains. Patient states that when she is on her menstrual cycle she bleeds very heavy and go through 7 to 8 pads a day. Patient also states that she has severe pelvic pains, nausea, vomiting, pains in both legs, and feelings of passing out. Patient states that today is her 3rd day of being on her cycle for this month and see bleeds for 6 days.  ? ?HPI ?Bethany Harris 21 y.o. female  has a past medical history of GERD (gastroesophageal reflux disease). To the [facility] for weakness. ? ?Patient states that she has increased weakness and cramps during menstrual cycle. Has had similar symptoms in the past, requiring treatment in the ED.  ? ?Limited HPI and ROS due to patient symptoms, became non-verbal during visit due to fatigue and weakness.  ? ?Past Medical History:  ?Diagnosis Date  ? GERD (gastroesophageal reflux disease)   ? ? ?No past surgical history on file. ? ?No family history on file. ? ?Social History  ? ?Socioeconomic History  ? Marital status: Single  ?  Spouse name: Not on file  ? Number of children: Not on file  ? Years of education: Not on file  ? Highest education level: Not on file  ?Occupational History  ? Occupation: Consulting civil engineer  ?Tobacco Use  ? Smoking status: Never  ? Smokeless tobacco: Never  ?Vaping Use  ? Vaping Use: Never used  ?Substance and Sexual Activity  ? Alcohol use: Never  ? Drug use: Never  ? Sexual activity: Never  ?Other Topics Concern  ? Not on file  ?Social History Narrative  ? Lives with dad and 6 other siblings  ? Right handed  ? Drinks caffeine rarely  ? ?Social Determinants of Health  ? ?Financial Resource Strain: Not on file  ?Food Insecurity: Not on file   ?Transportation Needs: Not on file  ?Physical Activity: Not on file  ?Stress: Not on file  ?Social Connections: Not on file  ?Intimate Partner Violence: Not on file  ? ? ?Outpatient Medications Prior to Visit  ?Medication Sig Dispense Refill  ? ALPRAZolam (XANAX) 0.25 MG tablet Take 0.25 mg by mouth at bedtime as needed for anxiety. (Patient not taking: Reported on 03/02/2022)    ? Celecoxib (ELYXYB) 120 MG/4.8ML SOLN Take 120 mg by mouth daily as needed (for acute migraine relief). (Patient not taking: Reported on 03/02/2022) 14 mL 5  ? clobetasol (TEMOVATE) 0.05 % external solution Apply 1 application topically 2 (two) times daily. Mix whole bottle in 1 tub of cerave cream and use on legs bid until rash clear, avoid face, groin, axilla (Patient not taking: Reported on 03/02/2022) 50 mL 0  ? clobetasol cream (TEMOVATE) 0.05 % Apply 1 application topically 2 (two) times daily. Apply to rash on left arm bid until clear, avoid face, groin, axilla 30 g 0  ? DIFFERIN 0.3 % gel Apply a pea-sized amount to face and chest. 45 g 3  ? ELIDEL 1 % cream Apply to affected areas face/body 1-2 times a day as needed for itchy rash. 100 g 2  ? hydrOXYzine (ATARAX) 10 MG tablet Take 1 - 3 tablets by  mouth at night as needed for itching. May make drowsy. 90 tablet 1  ? levocetirizine (XYZAL) 5 MG tablet Take 1 tablet (5 mg total) by mouth in the morning. 30 tablet 3  ? mirtazapine (REMERON) 15 MG tablet TAKE 1 TABLET(15 MG) BY MOUTH AT BEDTIME 30 tablet 3  ? Trifarotene (AKLIEF) 0.005 % CREA Apply to face and chest at bedtime, wash off in morning 45 g 2  ? ?No facility-administered medications prior to visit.  ? ? ?No Known Allergies ? ?Review of Systems  ?Constitutional:  Positive for malaise/fatigue. Negative for chills and fever.  ?Respiratory:  Negative for cough and shortness of breath.   ?Cardiovascular:  Negative for chest pain, palpitations and leg swelling.  ?Gastrointestinal:  Negative for abdominal pain, blood in stool,  constipation, diarrhea, nausea and vomiting.  ?Genitourinary:  Negative for dysuria, flank pain, frequency, hematuria and urgency.  ?     Severe menstrual cramps  ?Skin: Negative.   ?Neurological:  Positive for weakness. Negative for dizziness, tingling, tremors, sensory change, speech change, focal weakness, seizures, loss of consciousness and headaches.  ?Psychiatric/Behavioral:  Negative for depression. The patient is not nervous/anxious.   ?All other systems reviewed and are negative. ? ?   ?Objective:  ?  ?Physical Exam ?Constitutional:   ?   General: She is not in acute distress. ?   Appearance: She is normal weight. She is ill-appearing.  ?HENT:  ?   Head: Normocephalic.  ?Cardiovascular:  ?   Rate and Rhythm: Normal rate and regular rhythm.  ?   Pulses: Normal pulses.  ?   Heart sounds: Normal heart sounds.  ?   Comments: No obvious peripheral edema ?Pulmonary:  ?   Effort: Pulmonary effort is normal.  ?   Breath sounds: Normal breath sounds.  ?Skin: ?   General: Skin is warm and dry.  ?   Capillary Refill: Capillary refill takes less than 2 seconds.  ?Neurological:  ?   General: No focal deficit present.  ?   Mental Status: She is oriented to person, place, and time and easily aroused. She is lethargic.  ?Psychiatric:     ?   Mood and Affect: Mood normal.     ?   Behavior: Behavior normal.     ?   Thought Content: Thought content normal.     ?   Judgment: Judgment normal.  ? ? ?BP 121/85   Pulse 88   Temp 98.8 ?F (37.1 ?C)   Ht 5\' 3"  (1.6 m)   Wt 119 lb (54 kg)   LMP 02/28/2022 (Exact Date)   SpO2 99%   BMI 21.08 kg/m?  ?Wt Readings from Last 3 Encounters:  ?03/02/22 119 lb (54 kg)  ?11/14/21 124 lb (56.2 kg)  ?07/20/21 126 lb (57.2 kg) (46 %, Z= -0.10)*  ? ?* Growth percentiles are based on CDC (Girls, 2-20 Years) data.  ? ? ? ?There is no immunization history on file for this patient. ? ?Diabetic Foot Exam - Simple   ?No data filed ?  ? ? ?Lab Results  ?Component Value Date  ? TSH 1.140 01/03/2021   ? ?Lab Results  ?Component Value Date  ? WBC 10.3 11/04/2021  ? HGB 13.5 11/04/2021  ? HCT 39.1 11/04/2021  ? MCV 83.7 11/04/2021  ? PLT 212 11/04/2021  ? ?Lab Results  ?Component Value Date  ? NA 140 11/04/2021  ? K 3.6 11/04/2021  ? CO2 20 (L) 11/04/2021  ? GLUCOSE 144 (H) 11/04/2021  ?  BUN 8 11/04/2021  ? CREATININE 0.74 11/04/2021  ? BILITOT 0.4 11/04/2021  ? ALKPHOS 76 11/04/2021  ? AST 20 11/04/2021  ? ALT 13 11/04/2021  ? PROT 7.3 11/04/2021  ? ALBUMIN 4.6 11/04/2021  ? CALCIUM 10.2 11/04/2021  ? ANIONGAP 11 11/04/2021  ? ?No results found for: CHOL ?No results found for: HDL ?No results found for: LDLCALC ?No results found for: TRIG ?No results found for: CHOLHDL ?No results found for: HGBA1C ? ?   ?Assessment & Plan:  ? ?Problem List Items Addressed This Visit   ?None ?Visit Diagnoses   ? ? Weakness    -  Primary ?Patient became very lethargic during visit and exhibiting significant pain, frequently moaning. She became non verbal during visit and was escorted to the ED for further evaluation and management of symptoms. VS when patient departed clinic and patient stable.   ? Menstrual cramps      ? ?Follow up in 1-2 wks after ED evaluation, sooner as needed   ? ? ?I have discontinued Leler Munguia's ALPRAZolam, Elyxyb, clobetasol cream, clobetasol, Differin, levocetirizine, hydrOXYzine, mirtazapine, Aklief, and Elidel. ? ?No orders of the defined types were placed in this encounter. ? ? ? ?Kathrynn Speedewana I Nabria Nevin, NP ?  ?

## 2022-03-02 NOTE — Discharge Instructions (Addendum)
You were seen in the emergency department for vaginal bleeding and pain. ? ?I think your pain is related to your menstrual cycle. Please use Tylenol or ibuprofen for pain.  You may use 600 mg ibuprofen every 6 hours or 1000 mg of Tylenol every 6 hours.  You may choose to alternate between the two, this would be most effective. Do not exceed 4000 mg of Tylenol within 24 hours.  Do not exceed 3200 mg ibuprofen within 24 hours. ? ?You can use heating pads on your abdomen. Make sure you stay well hydrated.  ? ?Pain like this can be very common, but very frustrating. I recommend you follow up with the gynecologist to talk about managing your symptoms in the long term.  ?------------------------- ???? ?? ??? ??????? ???? ??????? ???? ? ??? ???? ??. ? ??? ??? ?? ??? ??? ??? ?? ?? ???? ?????? ??? ?? ????? ???. ????? ?? ??????? ?? ????????? ???? ??? ??????? ????. ??? ???? ??? ?? ?? ???? ??? ????????? ?? ?? ???? ?? ?? ???? ???? ??? ?????? ?? ?? ???? ??????? ????. ??? ???? ??? ?????? ???? ?? ?????? ??? ??? ??? ??? ??? ?? ????? ???? ????. ??? ???? ???? ???? ??? ?? ?????? ?? ??? ???? ? ???? ???? ??? ?? ????. ??? ?? ???? ? ?? ?? ???? ??? ????????? ?? ??? ???? ? ???? ???? ??? ?? ????. ? ???? ?? ?????? ?? ???? ??? ??????? ??? ??? ??? ??????? ????. ??????? ???? ???? ?? ??? ?? ???? ??????? ???? ?????. ? ???? ??? ??? ?? ????? ????? ???? ???? ??? ????? ???? ????? ???. ?? ????? ?? ??? ??? ?? ?????? ?? ????? ?????? ?? ???? ?? ???? ?????? ????? ??? ?? ?? ???? ???. ?

## 2022-03-02 NOTE — ED Triage Notes (Addendum)
Patient reports that she is having a large amount of vaginal bleeding with her period which started 3-4 days ago. ?Patient c/o mid abdominal pain and low back pain. ? ?Patient states that she " passed out today" while at the doctor's office today.  ?

## 2022-03-02 NOTE — ED Notes (Signed)
Pt said she wants a pelvic exam because her husband wants to make sure she is a virgin. ?

## 2022-03-02 NOTE — ED Provider Notes (Signed)
?[facility]-EMERGENCY DEPT ?Provider Note ? ? ?CSN: [id number] ?Arrival date & time: 03/02/22  3662 ? ?  ? ?History ? ?Chief Complaint  ?Patient presents with  ? Loss of Consciousness  ? Leg Pain  ? Abdominal Pain  ? ? ?Bethany Harris is a 21 y.o. female who presents emergency department complaining of vaginal bleeding, abdominal pain, and syncope earlier today.  Patient states that she started having a large amount of vaginal bleeding with her period about 4 days ago.  Today she is complaining of some mid abdominal pain as well as low back pain.  She was at her doctor's office earlier today when she "passed out". ? ? ? ?Loss of Consciousness ?Associated symptoms: nausea and vomiting   ?Associated symptoms: no chest pain, no fever, no headaches and no shortness of breath   ?Leg Pain ?Associated symptoms: no fever   ?Abdominal Pain ?Associated symptoms: nausea, vaginal bleeding and vomiting   ?Associated symptoms: no chest pain, no constipation, no diarrhea, no dysuria, no fever, no hematuria and no shortness of breath   ? ?  ? ?Home Medications ?Prior to Admission medications   ?Not on File  ?   ? ?Allergies    ?Patient has no known allergies.   ? ?Review of Systems   ?Review of Systems  ?Constitutional:  Negative for fever.  ?Respiratory:  Negative for shortness of breath.   ?Cardiovascular:  Positive for syncope. Negative for chest pain.  ?Gastrointestinal:  Positive for abdominal pain, nausea and vomiting. Negative for constipation and diarrhea.  ?Genitourinary:  Positive for menstrual problem and vaginal bleeding. Negative for dysuria, flank pain and hematuria.  ?Neurological:  Positive for syncope. Negative for headaches.  ?All other systems reviewed and are negative. ? ?Physical Exam ?Updated Vital Signs ?BP 117/77 (BP Location: Left Arm)   Pulse 80   Temp 98.4 ?F (36.9 ?C) (Oral)   Resp 18   Ht 5\' 3"  (1.6 m)   LMP 02/28/2022 (Exact Date)   SpO2 100%   BMI 21.08 kg/m?  ?Physical  Exam ?Vitals and nursing note reviewed.  ?Constitutional:   ?   Appearance: Normal appearance.  ?HENT:  ?   Head: Normocephalic and atraumatic.  ?Eyes:  ?   Conjunctiva/sclera: Conjunctivae normal.  ?Cardiovascular:  ?   Rate and Rhythm: Normal rate and regular rhythm.  ?Pulmonary:  ?   Effort: Pulmonary effort is normal. No respiratory distress.  ?   Breath sounds: Normal breath sounds.  ?Abdominal:  ?   General: There is no distension.  ?   Palpations: Abdomen is soft.  ?   Tenderness: There is abdominal tenderness in the suprapubic area. There is guarding. There is no right CVA tenderness, left CVA tenderness or rebound.  ?Skin: ?   General: Skin is warm and dry.  ?Neurological:  ?   General: No focal deficit present.  ?   Mental Status: She is alert.  ? ? ?ED Results / Procedures / Treatments   ?Labs ?(all labs ordered are listed, but only abnormal results are displayed) ?Labs Reviewed  ?COMPREHENSIVE METABOLIC PANEL - Abnormal; Notable for the following components:  ?    Result Value  ? Anion gap 3 (*)   ? All other components within normal limits  ?URINALYSIS, ROUTINE W REFLEX MICROSCOPIC - Abnormal; Notable for the following components:  ? Color, Urine STRAW (*)   ? Hgb urine dipstick LARGE (*)   ? Bacteria, UA RARE (*)   ? All other components  within normal limits  ?CBG MONITORING, ED - Abnormal; Notable for the following components:  ? Glucose-Capillary 112 (*)   ? All other components within normal limits  ?CBC WITH DIFFERENTIAL/PLATELET  ?I-STAT BETA HCG BLOOD, ED ([facility], [facility], [facility] ONLY)  ? ? ?EKG ?EKG Interpretation ? ?Date/Time:  Thursday March 02 2022 09:59:13 EDT ?Ventricular Rate:  87 ?PR Interval:  106 ?QRS Duration: 93 ?QT Interval:  366 ?QTC Calculation: 441 ?R Axis:   74 ?Text Interpretation: Sinus rhythm Short PR interval Consider right atrial enlargement Consider right ventricular hypertrophy No significant change since last tracing Confirmed by Melene PlanFloyd, Dan [phone]([id number]) on 03/02/2022 1:20:23  PM ? ?Radiology ?CT ABDOMEN PELVIS W CONTRAST ? ?Result Date: 03/02/2022 ?CLINICAL DATA:  Mid abdominal and low back pain. EXAM: CT ABDOMEN AND PELVIS WITH CONTRAST TECHNIQUE: Multidetector CT imaging of the abdomen and pelvis was performed using the standard protocol following bolus administration of intravenous contrast. RADIATION DOSE REDUCTION: This exam was performed according to the departmental dose-optimization program which includes automated exposure control, adjustment of the mA and/or kV according to patient size and/or use of iterative reconstruction technique. CONTRAST:  80mL OMNIPAQUE IOHEXOL 300 MG/ML  SOLN COMPARISON:  Prior CT of the abdomen and pelvis on 11/04/2021 FINDINGS: Lower chest: No acute abnormality. Hepatobiliary: No focal liver abnormality is seen. No gallstones, gallbladder wall thickening, or biliary dilatation. Pancreas: Unremarkable. No pancreatic ductal dilatation or surrounding inflammatory changes. Spleen: Normal in size without focal abnormality. Adrenals/Urinary Tract: Adrenal glands are unremarkable. Kidneys are normal, without renal calculi, focal lesion, or hydronephrosis. Bladder is unremarkable. Stomach/Bowel: Bowel shows no evidence of obstruction, ileus, inflammation or lesion. The appendix is normal. No free intraperitoneal air. Vascular/Lymphatic: No significant vascular findings are present. No enlarged abdominal or pelvic lymph nodes. Reproductive: There is a mild amount of fluid in the endometrial cavity. No adnexal masses. Other: No abdominal wall hernia or abnormality. No abdominopelvic ascites. Musculoskeletal: No acute or significant osseous findings. IMPRESSION: No acute findings in the abdomen or pelvis. Mild amount of fluid in the endometrial cavity is likely physiologic. Electronically Signed   By: Irish LackGlenn  Yamagata M.D.   On: 03/02/2022 11:40   ? ?Procedures ?Procedures  ? ? ?Medications Ordered in ED ?Medications  ?sodium chloride 0.9 % bolus 1,000 mL (0 mLs  Intravenous Stopped 03/02/22 1122)  ?ketorolac (TORADOL) 15 MG/ML injection 15 mg (15 mg Intravenous Given 03/02/22 1038)  ?iohexol (OMNIPAQUE) 300 MG/ML solution 80 mL (80 mLs Intravenous Contrast Given 03/02/22 1114)  ? ? ?ED Course/ Medical Decision Making/ A&P ?  ?                        ?Medical Decision Making ?Amount and/or Complexity of Data Reviewed ?Labs: ordered. ? ? ?This patient presents to the ED for concern of 21 year old female, this involves an extensive number of treatment options, and is a complaint that carries with it a high risk of complications and morbidity. The emergent differential diagnosis prior to evaluation includes, but is not limited to,  CVA, ACS, arrhythmia, vasovagal syncope, orthostatic hypotension, sepsis, hypoglycemia, electrolyte disturbance, respiratory failure, symptomatic anemia, dehydration, heat injury, polypharmacy, malignancy, anxiety/panic attack.  ? ?This is not an exhaustive differential.  ? ?Past Medical History / Co-morbidities / Social History: ?GERD, anxiety, migraines ? ?Additional history: ?Additional history obtained from chart review. External records from outside source obtained and reviewed including PCP visit from earlier today. Patient was being seen for heavy menstrual bleeding and cramping.  Provider reported patient became very lethargic during visit and exhibiting significant pain. She became non-verbal and was escorted to ED for further evaluation.  ? ?Physical Exam: ?Physical exam performed. The pertinent findings include: Afebrile, not tachycardic, no acute distress.  Generalized abdominal tenderness to palpation, worse in the suprapubic region.  Patient refused pelvic exam. ? ?Lab Tests: ?I ordered, and personally interpreted labs.  The pertinent results include: No leukocytosis.  Normal hemoglobin.  Normal kidney function, electrolytes within normal limits.  Urinalysis positive for blood, likely contaminant from vaginal bleeding.  No evidence of  infection.  Pregnancy test negative. ?  ?Imaging Studies: ?I ordered imaging studies including CT abdomen/pelvis. I independently visualized and interpreted imaging which showed no acute findings, mild amount of fluid in endometrial cavity l

## 2022-03-30 ENCOUNTER — Other Ambulatory Visit: Payer: Self-pay

## 2022-03-30 ENCOUNTER — Encounter (HOSPITAL_COMMUNITY): Payer: Self-pay

## 2022-03-30 ENCOUNTER — Emergency Department (HOSPITAL_COMMUNITY)
Admission: EM | Admit: 2022-03-30 | Discharge: 2022-03-31 | Disposition: A | Discharge status: Home / Self Care | Payer: Medicaid Other | Attending: Emergency Medicine | Admitting: Emergency Medicine

## 2022-03-30 DIAGNOSIS — R1030 Lower abdominal pain, unspecified: Secondary | ICD-10-CM | POA: Diagnosis not present

## 2022-03-30 DIAGNOSIS — R531 Weakness: Secondary | ICD-10-CM | POA: Diagnosis not present

## 2022-03-30 DIAGNOSIS — N946 Dysmenorrhea, unspecified: Secondary | ICD-10-CM | POA: Diagnosis not present

## 2022-03-30 DIAGNOSIS — N9489 Other specified conditions associated with female genital organs and menstrual cycle: Secondary | ICD-10-CM | POA: Insufficient documentation

## 2022-03-30 DIAGNOSIS — R42 Dizziness and giddiness: Secondary | ICD-10-CM | POA: Diagnosis present

## 2022-03-30 LAB — CBC WITH DIFFERENTIAL/PLATELET
Abs Immature Granulocytes: 0.01 10*3/uL (ref 0.00–0.07)
Basophils Absolute: 0.1 10*3/uL (ref 0.0–0.1)
Basophils Relative: 1 %
Eosinophils Absolute: 0.1 10*3/uL (ref 0.0–0.5)
Eosinophils Relative: 2 %
HCT: 40.8 % (ref 36.0–46.0)
Hemoglobin: 13.5 g/dL (ref 12.0–15.0)
Immature Granulocytes: 0 %
Lymphocytes Relative: 34 %
Lymphs Abs: 2.5 10*3/uL (ref 0.7–4.0)
MCH: 28.3 pg (ref 26.0–34.0)
MCHC: 33.1 g/dL (ref 30.0–36.0)
MCV: 85.5 fL (ref 80.0–100.0)
Monocytes Absolute: 0.6 10*3/uL (ref 0.1–1.0)
Monocytes Relative: 8 %
Neutro Abs: 4.3 10*3/uL (ref 1.7–7.7)
Neutrophils Relative %: 55 %
Platelets: 196 10*3/uL (ref 150–400)
RBC: 4.77 MIL/uL (ref 3.87–5.11)
RDW: 11.8 % (ref 11.5–15.5)
WBC: 7.5 10*3/uL (ref 4.0–10.5)
nRBC: 0 % (ref 0.0–0.2)

## 2022-03-30 LAB — COMPREHENSIVE METABOLIC PANEL
ALT: 13 U/L (ref 0–44)
AST: 17 U/L (ref 15–41)
Albumin: 4.5 g/dL (ref 3.5–5.0)
Alkaline Phosphatase: 70 U/L (ref 38–126)
Anion gap: 7 (ref 5–15)
BUN: 15 mg/dL (ref 6–20)
CO2: 25 mmol/L (ref 22–32)
Calcium: 9.4 mg/dL (ref 8.9–10.3)
Chloride: 110 mmol/L (ref 98–111)
Creatinine, Ser: 0.75 mg/dL (ref 0.44–1.00)
GFR, Estimated: >60 mL/min (ref >60)
Glucose, Bld: 95 mg/dL (ref 70–99)
Potassium: 3.6 mmol/L (ref 3.5–5.1)
Sodium: 142 mmol/L (ref 135–145)
Total Bilirubin: 0.7 mg/dL (ref 0.3–1.2)
Total Protein: 7.2 g/dL (ref 6.5–8.1)

## 2022-03-30 LAB — LIPASE, BLOOD: Lipase: 47 U/L (ref 11–51)

## 2022-03-30 LAB — I-STAT BETA HCG BLOOD, ED (MC, WL, AP ONLY): I-stat hCG, quantitative: <5 m[IU]/mL (ref <5)

## 2022-03-30 MED ORDER — KETOROLAC TROMETHAMINE 10 MG PO TABS
10.0000 mg | ORAL_TABLET | Freq: Once | ORAL | Status: AC
  Administered 2022-03-31: 10 mg via ORAL
  Filled 2022-03-30: qty 1

## 2022-03-30 NOTE — ED Provider Notes (Signed)
?[facility]-EMERGENCY DEPT ?Provider Note ? ? ?CSN: [id number] ?Arrival date & time: 03/30/22  2246 ? ?  ? ?History ? ?Chief Complaint  ?Patient presents with  ? Abdominal Pain  ? ? ?Bethany Harris is a 21 y.o. female. ? ?21 year old female presents with complaint of painful menstrual cycle with lower abdominal pain, 1 episode of syncope today, was feeling weak and dizzy, no injuries as a result of.  States that she is on her third day of her menstrual cycle.  Reports similar episode with her menstrual cycle 1 month ago, came to the emergency room and is given IV fluids and Toradol and her symptoms were significantly proved.  Patient has been taking Tylenol without improvement.  Denies possibility of pregnancy.  No history of anemia, not anticoagulated, no prior blood transfusions. ? ? ?  ? ?Home Medications ?Prior to Admission medications   ?Medication Sig Start Date End Date Taking? Authorizing Provider  ?ketorolac (TORADOL) 10 MG tablet Take 1 tablet (10 mg total) by mouth every 6 (six) hours as needed. 03/31/22  Yes Jeannie Fend, PA-C  ?   ? ?Allergies    ?Patient has no known allergies.   ? ?Review of Systems   ?Review of Systems ?Negative except as per HPI ?Physical Exam ?Updated Vital Signs ?BP 110/70   Pulse 80   Temp (!) 97.5 ?F (36.4 ?C) (Oral)   Resp 18   Ht 5\' 2"  (1.575 m)   Wt 56.7 kg   SpO2 100%   BMI 22.86 kg/m?  ?Physical Exam ?Vitals and nursing note reviewed.  ?Constitutional:   ?   General: She is not in acute distress. ?   Appearance: She is well-developed. She is not diaphoretic.  ?HENT:  ?   Head: Normocephalic and atraumatic.  ?Cardiovascular:  ?   Rate and Rhythm: Normal rate and regular rhythm.  ?   Heart sounds: Normal heart sounds.  ?Pulmonary:  ?   Effort: Pulmonary effort is normal.  ?   Breath sounds: Normal breath sounds.  ?Abdominal:  ?   Palpations: Abdomen is soft.  ?   Tenderness: There is abdominal tenderness in the right lower quadrant, suprapubic area and  left lower quadrant.  ?Skin: ?   General: Skin is warm and dry.  ?   Coloration: Skin is not pale.  ?Neurological:  ?   Mental Status: She is alert and oriented to person, place, and time.  ?Psychiatric:     ?   Behavior: Behavior normal.  ? ? ?ED Results / Procedures / Treatments   ?Labs ?(all labs ordered are listed, but only abnormal results are displayed) ?Labs Reviewed  ?URINALYSIS, ROUTINE W REFLEX MICROSCOPIC - Abnormal; Notable for the following components:  ?    Result Value  ? Color, Urine RED (*)   ? APPearance CLOUDY (*)   ? pH 8.5 (*)   ? Glucose, UA 100 (*)   ? Hgb urine dipstick LARGE (*)   ? Ketones, ur 15 (*)   ? Protein, ur >300 (*)   ? Nitrite POSITIVE (*)   ? Leukocytes,Ua MODERATE (*)   ? All other components within normal limits  ?URINALYSIS, MICROSCOPIC (REFLEX) - Abnormal; Notable for the following components:  ? Bacteria, UA RARE (*)   ? All other components within normal limits  ?COMPREHENSIVE METABOLIC PANEL  ?CBC WITH DIFFERENTIAL/PLATELET  ?LIPASE, BLOOD  ?I-STAT BETA HCG BLOOD, ED ([facility], [facility], [facility] ONLY)  ? ? ?EKG ?None ? ?Radiology ?No results  found. ? ?Procedures ?Procedures  ? ? ?Medications Ordered in ED ?Medications  ?ketorolac (TORADOL) tablet 10 mg (10 mg Oral Given 03/31/22 0001)  ? ? ?ED Course/ Medical Decision Making/ A&P ?  ?                        ?Medical Decision Making ?Risk ?Prescription drug management. ? ? ?This patient presents to the ED for concern of painful menstrual cycle, this involves an extensive number of treatment options, and is a complaint that carries with it a high risk of complications and morbidity.  The differential diagnosis includes dysmenorrhea, ectopic pregnancy, AB, torsion, appendicitis, cystitis, Pilo ? ? ?Co morbidities that complicate the patient evaluation ? ?Otherwise healthy female with history of dysmenorrhea ? ? ?Additional history obtained: ? ?External records from outside source obtained and reviewed including prior to the emergency room on  March 02, 2022, treated with IV fluids and Toradol. ?Prior hemoglobin 13.5, unchanged today. ?CT abdomen pelvis from March 02, 2022 reviewed, no acute findings in the abdomen or pelvis, mild amount of fluid in the endometrial cavity is likely physiologic. ? ? ?Lab Tests: ? ?I Ordered, and personally interpreted labs.  The pertinent results include: CBC with stable H&H, hCG negative. ? ?Problem List / ED Course / Critical interventions / Medication management ? ?21 year old female with complaint of lower abdominal pain on her menstrual cycle.  Symptoms reported to be same as previous cycles, seeking Toradol as administered on prior ER visit which resulted in resolution of her pain.  Patient was given Toradol.  Vitals stable, pain improved, will discharge with short course of Toradol and recommend recheck with primary care provider. ?Vitals are stable, labs stable bleeding stable H&H, hCG negative.  Normal white blood cell count.  Doubt appendicitis or other infectious source.  Plan is to treat with Toradol and discharge with limited Rx for same with plan for recheck with PCP. ?I ordered medication including Toradol for pain ?Reevaluation of the patient after these medicines showed that the patient improved ?I have reviewed the patients home medicines and have made adjustments as needed ? ? ?Social Determinants of Health: ? ?Has PCP for follow-up care ? ? ?Test / Admission - Considered: ? ?Considered pelvic ultrasound versus CT scan.  Prior record reviewed from 1 month ago, CT without significant findings, generalized pelvic discomfort not localizing to one side more than another, do not suspect torsion at this time. ? ? ? ? ? ? ? ? ?Final Clinical Impression(s) / ED Diagnoses ?Final diagnoses:  ?Dysmenorrhea  ? ? ?Rx / DC Orders ?ED Discharge Orders   ? ?      Ordered  ?  ketorolac (TORADOL) 10 MG tablet  Every 6 hours PRN       ? 03/31/22 0030  ? ?  ?  ? ?  ? ? ?  ?Jeannie Fend, PA-C ?03/31/22 0133 ? ?  ?Tilden Fossa, MD ?03/31/22 5170 ? ?

## 2022-03-30 NOTE — ED Provider Triage Note (Signed)
Emergency Medicine Provider Triage Evaluation Note ? ?Bethany Harris , a 21 y.o. female  was evaluated in triage.  Pt complains of pelvic pain, vaginal bleeding, nausea, vomiting.  Symptoms started 3 days ago.  States that she has been experiencing these symptoms with her menstrual cycle over the past few months.  States that when seen last month she was "given a shot" and her symptoms resolved. ? ?Physical Exam  ?BP 114/72 (BP Location: Left Arm)   Pulse (!) 106   Temp (!) 97.5 ?F (36.4 ?C) (Oral)   Resp 16   Ht 5\' 2"  (1.575 m)   Wt 56.7 kg   LMP 02/28/2022 (Exact Date)   SpO2 100%   BMI 22.86 kg/m?  ?Gen:   Awake, no distress   ?Resp:  Normal effort  ?MSK:   Moves extremities without difficulty  ?Other:   ? ?Medical Decision Making  ?Medically screening exam initiated at 10:57 PM.  Appropriate orders placed.  Bethany Harris was informed that the remainder of the evaluation will be completed by another provider, this initial triage assessment does not replace that evaluation, and the importance of remaining in the ED until their evaluation is complete. ?  ?Rayna Sexton, PA-C ?03/30/22 2258 ? ?

## 2022-03-30 NOTE — ED Triage Notes (Signed)
Patient said she has been having abdominal pain and emesis for 3 days. Pain is right around her belly button. She said she is having trouble walking because both of her feet are numb. Her stomach hurts right when her period begins. ?

## 2022-03-30 NOTE — Discharge Instructions (Addendum)
Prescription given for Toradol, this is an NSAID pain reliever.  Do not take additional Advil/ibuprofen/Motrin/Aleve/naproxen.  Take this medication with food. ?Recommend follow-up with your primary care provider for recheck. ?Turn to the emergency room for worsening or concerning symptoms. ?

## 2022-03-31 LAB — URINALYSIS, MICROSCOPIC (REFLEX): Squamous Epithelial / HPF: NONE SEEN (ref 0–5)

## 2022-03-31 LAB — URINALYSIS, ROUTINE W REFLEX MICROSCOPIC
Bilirubin Urine: NEGATIVE
Glucose, UA: 100 mg/dL — AB
Ketones, ur: 15 mg/dL — AB
Nitrite: POSITIVE — AB
Protein, ur: >300 mg/dL — AB
Specific Gravity, Urine: 1.01 (ref 1.005–1.030)
pH: 8.5 — ABNORMAL HIGH (ref 5.0–8.0)

## 2022-03-31 MED ORDER — KETOROLAC TROMETHAMINE 10 MG PO TABS: 10.0000 mg | ORAL_TABLET | Freq: Four times a day (QID) | ORAL | 0 refills | Status: DC | PRN

## 2022-04-02 ENCOUNTER — Emergency Department (HOSPITAL_COMMUNITY)
Admission: EM | Admit: 2022-04-02 | Discharge: 2022-04-02 | Discharge status: Against Medical Advice | Payer: Medicaid Other | Attending: Emergency Medicine | Admitting: Emergency Medicine

## 2022-04-02 DIAGNOSIS — N946 Dysmenorrhea, unspecified: Secondary | ICD-10-CM | POA: Diagnosis not present

## 2022-04-02 DIAGNOSIS — Z5321 Procedure and treatment not carried out due to patient leaving prior to being seen by health care provider: Secondary | ICD-10-CM | POA: Insufficient documentation

## 2022-04-02 MED ORDER — KETOROLAC TROMETHAMINE 30 MG/ML IJ SOLN
30.0000 mg | Freq: Once | INTRAMUSCULAR | Status: AC
  Administered 2022-04-02: 30 mg via INTRAMUSCULAR
  Filled 2022-04-02: qty 1

## 2022-04-02 NOTE — ED Notes (Signed)
Pt seen leaving ER lobby. ?

## 2022-04-02 NOTE — ED Provider Triage Note (Signed)
Emergency Medicine Provider Triage Evaluation Note ? ?Bethany Harris , a 21 y.o. female  was evaluated in triage.  Pt complains of painful menstrual cycle.  Patient seen 3 days ago, given Toradol tablet and discharged home.  Patient states that this did not alleviate her pain.  Patient states that usually Toradol shots alleviate her pain.  Toradol shot has been provided in triage. ? ?Review of Systems  ?Positive:  ?Negative:  ? ?Physical Exam  ?BP 119/89   Pulse (!) 110   Temp 97.6 ?F (36.4 ?C) (Oral)   Resp 18   Ht 5\' 2"  (1.575 m)   Wt 56.7 kg   SpO2 100%   BMI 22.86 kg/m?  ?Gen:   Awake, no distress   ?Resp:  Normal effort  ?MSK:   Moves extremities without difficulty  ?Other:  Diffusely tender abdomen ? ?Medical Decision Making  ?Medically screening exam initiated at 1:00 PM.  Appropriate orders placed.  Gwendlyn Dia was informed that the remainder of the evaluation will be completed by another provider, this initial triage assessment does not replace that evaluation, and the importance of remaining in the ED until their evaluation is complete. ? ? ?  ? , PA-C ?04/02/22 1300 ? ?

## 2022-04-02 NOTE — ED Triage Notes (Signed)
Pt states she started having abdominal pain about 7 hours ago. States this is day 5 of her menstrual cycle. States this has been a heavier period than usual. Pt also states both of her legs hurt. ?

## 2022-05-08 ENCOUNTER — Ambulatory Visit (INDEPENDENT_AMBULATORY_CARE_PROVIDER_SITE_OTHER): Payer: Medicaid Other | Admitting: Dermatology

## 2022-05-08 DIAGNOSIS — L300 Nummular dermatitis: Secondary | ICD-10-CM

## 2022-05-08 DIAGNOSIS — L650 Telogen effluvium: Secondary | ICD-10-CM | POA: Diagnosis not present

## 2022-05-08 MED ORDER — MOMETASONE FUROATE 0.1 % EX CREA: TOPICAL_CREAM | CUTANEOUS | 1 refills | Status: DC

## 2022-05-08 MED ORDER — KETOCONAZOLE 2 % EX SHAM: MEDICATED_SHAMPOO | CUTANEOUS | 1 refills | Status: DC

## 2022-05-08 NOTE — Progress Notes (Deleted)
   Follow-Up Visit   Subjective  Bethany Harris is a 21 y.o. female who presents for the following: Acne.    The following portions of the chart were reviewed this encounter and updated as appropriate:       Review of Systems:  No other skin or systemic complaints except as noted in HPI or Assessment and Plan.  Objective  Well appearing patient in no apparent distress; mood and affect are within normal limits.  [id number] full examination was performed including scalp, head, eyes, ears, nose, lips, neck, chest, axillae, abdomen, back, buttocks, bilateral upper extremities, bilateral lower extremities, hands, feet, fingers, toes, fingernails, and toenails. All findings within normal limits unless otherwise noted below."}    Assessment & Plan   No follow-ups on file.

## 2022-05-08 NOTE — Progress Notes (Signed)
Follow-Up Visit   Subjective  Bethany Harris is a 21 y.o. female who presents for the following: Rash (Pt c/o itchy rash on the chest, back and arms that will come and go). Check scalp for hair shedding and thinning for several months, pt report no stressor, no sickness etc. She emigrated from [place] over a year ago, She received 15 vaccines at that time.   The following portions of the chart were reviewed this encounter and updated as appropriate:       Review of Systems:  No other skin or systemic complaints except as noted in HPI or Assessment and Plan.  Objective  Well appearing patient in no apparent distress; mood and affect are within normal limits.  A focused examination was performed including face,chest,back. Relevant physical exam findings are noted in the Assessment and Plan.  chest,back Pink patch on mid chest   scalp Shortened hairs at frontal/temporal hairline, frontal scalp and bilateral temples with mild diffuse thinning    Assessment & Plan  Nummular dermatitis chest,back  Nummular dermatitis vs Seborrheic dermatitis   Start Mometasone cream apply to affected areas 1-2 times a day prn itch  Topical steroids (such as triamcinolone, fluocinolone, fluocinonide, mometasone, clobetasol, halobetasol, betamethasone, hydrocortisone) can cause thinning and lightening of the skin if they are used for too long in the same area. Your physician has selected the right strength medicine for your problem and area affected on the body. Please use your medication only as directed by your physician to prevent side effects.    Related Medications mometasone (ELOCON) 0.1 % cream Apply to affected itchy skin qd-bid prn, Avoid applying to face, groin, and axilla. Use as directed. Long-term use can cause thinning of the skin.  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  It does not progress to baldness, and the hair eventually grows back on its own. It can be triggered by recent illness, recent surgery, thyroid disease, low iron stores, vitamin D deficiency, fad diets or rapid weight loss, hormonal changes such as pregnancy or birth control pills, and some medication. Usually the hair loss starts 2-3 months after the illness or health change. Rarely, it can continue for longer than a year.   Recommend minoxidil 5% (Rogaine for men) solution or foam to be applied to the scalp and left in. This should ideally be used twice daily for best results but it helps with hair regrowth when used at least three times per week. Rogaine initially can cause increased hair shedding for the first few weeks but this will stop with continued use. In studies, people who used minoxidil (Rogaine) for at least 6 months had thicker hair than people who did not. Minoxidil topical (Rogaine) only works as long as it continues to be used. If if it is no longer used then the hair it has been helping to regrow can fall out. Minoxidil topical (Rogaine) can cause increased facial hair growth which can usually be managed easily with a battery-operated hair trimmer. If facial hair growth is bothersome, switching to the 2% women's version can decrease the risk of unwanted facial hair growth.  Start Ketoconazole 2% shampoo apply two times per week, massage into scalp and leave in for 10 minutes before rinsing out    ketoconazole (NIZORAL) 2 % shampoo - scalp apply two times per week, massage into scalp and leave in for 10 minutes before rinsing out   Return  in about 3 months (around 08/08/2022) for nummular dermatitis, Telogen effluvium .  I, Angelique Holm, CMA, am acting as scribe for Willeen Niece, MD .   Documentation: I have reviewed the above documentation for accuracy and completeness, and I agree with the above.  Willeen Niece MD

## 2022-05-08 NOTE — Patient Instructions (Addendum)
Recommend minoxidil 5% (Rogaine for men) solution or foam to be applied to the scalp and left in. This should ideally be used twice daily for best results but it helps with hair regrowth when used at least three times per week. Rogaine initially can cause increased hair shedding for the first few weeks but this will stop with continued use. In studies, people who used minoxidil (Rogaine) for at least 6 months had thicker hair than people who did not. Minoxidil topical (Rogaine) only works as long as it continues to be used. If if it is no longer used then the hair it has been helping to regrow can fall out. Minoxidil topical (Rogaine) can cause increased facial hair growth which can usually be managed easily with a battery-operated hair trimmer. If facial hair growth is bothersome, switching to the 2% women's version can decrease the risk of unwanted facial hair growth.     Due to recent changes in healthcare laws, you may see results of your pathology and/or laboratory studies on MyChart before the doctors have had a chance to review them. We understand that in some cases there may be results that are confusing or concerning to you. Please understand that not all results are received at the same time and often the doctors may need to interpret multiple results in order to provide you with the best plan of care or course of treatment. Therefore, we ask that you please give us 2 business days to thoroughly review all your results before contacting the office for clarification. Should we see a critical lab result, you will be contacted sooner.   If You Need Anything After Your Visit  If you have any questions or concerns for your doctor, please call our main line at [phone] and press option 4 to reach your doctor's medical assistant. If no one answers, please leave a voicemail as directed and we will return your call as soon as possible. Messages left after 4 pm will be answered the following business  day.   You may also send us a message via MyChart. We typically respond to MyChart messages within 1-2 business days.  For prescription refills, please ask your pharmacy to contact our office. Our fax number is [phone].  If you have an urgent issue when the clinic is closed that cannot wait until the next business day, you can page your doctor at the number below.    Please note that while we do our best to be available for urgent issues outside of office hours, we are not available 24/7.   If you have an urgent issue and are unable to reach us, you may choose to seek medical care at your doctor's office, retail clinic, urgent care center, or emergency room.  If you have a medical emergency, please immediately call 911 or go to the emergency department.  Pager Numbers  - Dr. Kowalski: [phone]  - Dr. Moye: [phone]  - Dr. Stewart: [phone]  In the event of inclement weather, please call our main line at [phone] for an update on the status of any delays or closures.  Dermatology Medication Tips: Please keep the boxes that topical medications come in in order to help keep track of the instructions about where and how to use these. Pharmacies typically print the medication instructions only on the boxes and not directly on the medication tubes.   If your medication is too expensive, please contact our office at [phone] option 4 or send us a message through   MyChart.   We are unable to tell what your co-pay for medications will be in advance as this is different depending on your insurance coverage. However, we may be able to find a substitute medication at lower cost or fill out paperwork to get insurance to cover a needed medication.   If a prior authorization is required to get your medication covered by your insurance company, please allow us 1-2 business days to complete this process.  Drug prices often vary depending on where the prescription is filled and  some pharmacies may offer cheaper prices.  The website www.goodrx.com contains coupons for medications through different pharmacies. The prices here do not account for what the cost may be with help from insurance (it may be cheaper with your insurance), but the website can give you the price if you did not use any insurance.  - You can print the associated coupon and take it with your prescription to the pharmacy.  - You may also stop by our office during regular business hours and pick up a GoodRx coupon card.  - If you need your prescription sent electronically to a different pharmacy, notify our office through [facility] MyChart or by phone at [phone] option 4.     Si Usted Necesita Algo Despus de Su Visita  Tambin puede enviarnos un mensaje a travs de MyChart. Por lo general respondemos a los mensajes de MyChart en el transcurso de 1 a 2 das hbiles.  Para renovar recetas, por favor pida a su farmacia que se ponga en contacto con nuestra oficina. Nuestro nmero de fax es el [phone].  Si tiene un asunto urgente cuando la clnica est cerrada y que no puede esperar hasta el siguiente da hbil, puede llamar/localizar a su doctor(a) al nmero que aparece a continuacin.   Por favor, tenga en cuenta que aunque hacemos todo lo posible para estar disponibles para asuntos urgentes fuera del horario de oficina, no estamos disponibles las 24 horas del da, los 7 das de la semana.   Si tiene un problema urgente y no puede comunicarse con nosotros, puede optar por buscar atencin mdica  en el consultorio de su doctor(a), en una clnica privada, en un centro de atencin urgente o en una sala de emergencias.  Si tiene una emergencia mdica, por favor llame inmediatamente al 911 o vaya a la sala de emergencias.  Nmeros de bper  - Dr. Kowalski: [phone]  - Dra. Moye: [phone]  - Dra. Stewart: [phone]  En caso de inclemencias del tiempo, por favor llame a nuestra  lnea principal al [phone] para una actualizacin sobre el estado de cualquier retraso o cierre.  Consejos para la medicacin en dermatologa: Por favor, guarde las cajas en las que vienen los medicamentos de uso tpico para ayudarle a seguir las instrucciones sobre dnde y cmo usarlos. Las farmacias generalmente imprimen las instrucciones del medicamento slo en las cajas y no directamente en los tubos del medicamento.   Si su medicamento es muy caro, por favor, pngase en contacto con nuestra oficina llamando al [phone] y presione la opcin 4 o envenos un mensaje a travs de MyChart.   No podemos decirle cul ser su copago por los medicamentos por adelantado ya que esto es diferente dependiendo de la cobertura de su seguro. Sin embargo, es posible que podamos encontrar un medicamento sustituto a menor costo o llenar un formulario para que el seguro cubra el medicamento que se considera necesario.   Si se requiere una autorizacin previa   para que su compaa de seguros cubra su medicamento, por favor permtanos de 1 a 2 das hbiles para completar este proceso.  Los precios de los medicamentos varan con frecuencia dependiendo del lugar de dnde se surte la receta y alguna farmacias pueden ofrecer precios ms baratos.  El sitio web www.goodrx.com tiene cupones para medicamentos de diferentes farmacias. Los precios aqu no tienen en cuenta lo que podra costar con la ayuda del seguro (puede ser ms barato con su seguro), pero el sitio web puede darle el precio si no utiliz ningn seguro.  - Puede imprimir el cupn correspondiente y llevarlo con su receta a la farmacia.  - Tambin puede pasar por nuestra oficina durante el horario de atencin regular y recoger una tarjeta de cupones de GoodRx.  - Si necesita que su receta se enve electrnicamente a una farmacia diferente, informe a nuestra oficina a travs de MyChart de [facility] o por telfono llamando al [phone] y presione la  opcin 4.  

## 2022-05-09 NOTE — Congregational Nurse Program (Signed)
  Dept: [phone]   Congregational Nurse Program Note  Date of Encounter: 05/09/2022  Past Medical History: Past Medical History:  Diagnosis Date   GERD (gastroesophageal reflux disease)     Encounter Details:  CNP Questionnaire - 05/09/22 0956       Questionnaire   Location Patient Served  [facility]    Visit Setting Church or Organization    Patient Status Refugee    Insurance Medicare    Insurance Referral N/A    Medication N/A    Medical Provider Yes    Screening Referrals N/A    Medical Referral Urgent Care    Medical Appointment Made N/A    Food N/A    Transportation N/A    Housing/Utilities N/A    Interpersonal Safety N/A    Intervention Blood pressure;Advocate;Navigate Healthcare System;Case Management;Counsel;Educate;Support    ED Visit Averted Yes    Life-Saving Intervention Made N/A            Patient reported left foot pain after injury. Patient is unable to move toes in left foot due to pain. Foot is warm with palpable pulses. Referred her to go to urgent care for x ray to determine if there is a break. Also spoke with patient about making an appointment with OBGYN for dysmenorrhea. Will make appointment and notify patient when the appointment is made.   Nicole Cella Aamina Skiff RN BSN PCCN  [facility] [phone]-cell [phone]-office

## 2022-05-09 NOTE — Progress Notes (Signed)
This encounter was created in error - please disregard.

## 2022-05-10 NOTE — Congregational Nurse Program (Signed)
  Dept: [phone]   Congregational Nurse Program Note  Date of Encounter: 05/10/2022  Past Medical History: Past Medical History:  Diagnosis Date   GERD (gastroesophageal reflux disease)     Encounter Details:  CNP Questionnaire - 05/10/22 1049       Questionnaire   Do you give verbal consent to treat you today? Yes    Location Patient Served  [facility]    Visit Setting Church or Organization    Patient Status Refugee    Insurance Medicare    Insurance Referral N/A    Medication N/A    Medical Provider Yes    Screening Referrals N/A    Medical Referral Other    Medical Appointment Made N/A    Food N/A    Transportation N/A    Housing/Utilities N/A    Interpersonal Safety N/A    Intervention Blood pressure;Advocate;Navigate Healthcare System;Case Management;Counsel;Educate;Support    ED Visit Averted Yes    Life-Saving Intervention Made N/A            Patient came in and requested appointment for GYN. Assisted patient with scheduling appointment. Patient was advised to go to urgent care on 05/09/22 on that day for left foot injury and did not. She reports to have done home remedies. Advised patient to go to urgent care if pain persists.  Nicole Cella Kaladin Noseworthy RN BSN PCCN  [facility] [phone]

## 2022-05-29 ENCOUNTER — Emergency Department (HOSPITAL_COMMUNITY): Payer: Medicaid Other

## 2022-05-29 ENCOUNTER — Other Ambulatory Visit: Payer: Self-pay

## 2022-05-29 ENCOUNTER — Encounter (HOSPITAL_COMMUNITY): Payer: Self-pay

## 2022-05-29 ENCOUNTER — Emergency Department (HOSPITAL_COMMUNITY)
Admission: EM | Admit: 2022-05-29 | Discharge: 2022-05-29 | Disposition: A | Discharge status: Home / Self Care | Payer: Medicaid Other | Attending: Student | Admitting: Student

## 2022-05-29 DIAGNOSIS — R1030 Lower abdominal pain, unspecified: Secondary | ICD-10-CM | POA: Diagnosis present

## 2022-05-29 DIAGNOSIS — N946 Dysmenorrhea, unspecified: Secondary | ICD-10-CM | POA: Diagnosis not present

## 2022-05-29 LAB — COMPREHENSIVE METABOLIC PANEL
ALT: 13 U/L (ref 0–44)
AST: 14 U/L — ABNORMAL LOW (ref 15–41)
Albumin: 4.2 g/dL (ref 3.5–5.0)
Alkaline Phosphatase: 72 U/L (ref 38–126)
Anion gap: 5 (ref 5–15)
BUN: 17 mg/dL (ref 6–20)
CO2: 26 mmol/L (ref 22–32)
Calcium: 9.2 mg/dL (ref 8.9–10.3)
Chloride: 111 mmol/L (ref 98–111)
Creatinine, Ser: 0.8 mg/dL (ref 0.44–1.00)
GFR, Estimated: >60 mL/min (ref >60)
Glucose, Bld: 104 mg/dL — ABNORMAL HIGH (ref 70–99)
Potassium: 4 mmol/L (ref 3.5–5.1)
Sodium: 142 mmol/L (ref 135–145)
Total Bilirubin: 0.4 mg/dL (ref 0.3–1.2)
Total Protein: 6.7 g/dL (ref 6.5–8.1)

## 2022-05-29 LAB — PREGNANCY, URINE: Preg Test, Ur: NEGATIVE

## 2022-05-29 LAB — URINALYSIS, ROUTINE W REFLEX MICROSCOPIC
Bacteria, UA: NONE SEEN
Bilirubin Urine: NEGATIVE
Glucose, UA: NEGATIVE mg/dL
Ketones, ur: NEGATIVE mg/dL
Nitrite: NEGATIVE
Protein, ur: 100 mg/dL — AB
RBC / HPF: >50 RBC/hpf — ABNORMAL HIGH (ref 0–5)
Specific Gravity, Urine: 1.004 — ABNORMAL LOW (ref 1.005–1.030)
pH: 8 (ref 5.0–8.0)

## 2022-05-29 LAB — CBC WITH DIFFERENTIAL/PLATELET
Abs Immature Granulocytes: 0.02 10*3/uL (ref 0.00–0.07)
Basophils Absolute: 0 10*3/uL (ref 0.0–0.1)
Basophils Relative: 0 %
Eosinophils Absolute: 0.1 10*3/uL (ref 0.0–0.5)
Eosinophils Relative: 1 %
HCT: 37 % (ref 36.0–46.0)
Hemoglobin: 12.7 g/dL (ref 12.0–15.0)
Immature Granulocytes: 0 %
Lymphocytes Relative: 24 %
Lymphs Abs: 2.2 10*3/uL (ref 0.7–4.0)
MCH: 29.4 pg (ref 26.0–34.0)
MCHC: 34.3 g/dL (ref 30.0–36.0)
MCV: 85.6 fL (ref 80.0–100.0)
Monocytes Absolute: 0.7 10*3/uL (ref 0.1–1.0)
Monocytes Relative: 8 %
Neutro Abs: 6 10*3/uL (ref 1.7–7.7)
Neutrophils Relative %: 67 %
Platelets: 152 10*3/uL (ref 150–400)
RBC: 4.32 MIL/uL (ref 3.87–5.11)
RDW: 11.9 % (ref 11.5–15.5)
WBC: 9.2 10*3/uL (ref 4.0–10.5)
nRBC: 0 % (ref 0.0–0.2)

## 2022-05-29 LAB — LIPASE, BLOOD: Lipase: 47 U/L (ref 11–51)

## 2022-05-29 MED ORDER — HYDROMORPHONE HCL 1 MG/ML IJ SOLN
0.5000 mg | Freq: Once | INTRAMUSCULAR | Status: AC
  Administered 2022-05-29: 0.5 mg via INTRAVENOUS
  Filled 2022-05-29: qty 1

## 2022-05-29 MED ORDER — ONDANSETRON HCL 4 MG/2ML IJ SOLN
4.0000 mg | Freq: Once | INTRAMUSCULAR | Status: AC
  Administered 2022-05-29: 4 mg via INTRAVENOUS
  Filled 2022-05-29: qty 2

## 2022-05-29 MED ORDER — DIPHENHYDRAMINE HCL 25 MG PO CAPS
25.0000 mg | ORAL_CAPSULE | Freq: Once | ORAL | Status: AC
  Administered 2022-05-29: 25 mg via ORAL
  Filled 2022-05-29: qty 1

## 2022-05-29 MED ORDER — SODIUM CHLORIDE 0.9 % IV BOLUS
1000.0000 mL | Freq: Once | INTRAVENOUS | Status: AC
  Administered 2022-05-29: 1000 mL via INTRAVENOUS

## 2022-05-29 MED ORDER — IOHEXOL 300 MG/ML  SOLN
100.0000 mL | Freq: Once | INTRAMUSCULAR | Status: AC | PRN
  Administered 2022-05-29: 100 mL via INTRAVENOUS

## 2022-05-29 MED ORDER — IBUPROFEN 600 MG PO TABS: 600.0000 mg | ORAL_TABLET | Freq: Two times a day (BID) | ORAL | 0 refills | Status: DC | PRN

## 2022-05-29 MED ORDER — METOCLOPRAMIDE HCL 5 MG/ML IJ SOLN
10.0000 mg | Freq: Once | INTRAMUSCULAR | Status: AC
  Administered 2022-05-29: 10 mg via INTRAVENOUS
  Filled 2022-05-29: qty 2

## 2022-05-29 NOTE — Discharge Instructions (Addendum)
pain is from menstrual cramping, I have started you on pain medication please take as prescribed.  It is very important that you keep your appointment with OB/GYN on the sixth as you will need further evaluation.  Come back to the emergency department if you develop chest pain, shortness of breath, severe abdominal pain, uncontrolled nausea, vomiting, diarrhea.

## 2022-05-29 NOTE — ED Provider Notes (Signed)
[facility] DEPT Provider Note   CSN: [id number] Arrival date & time: 05/29/22  0003     History  Chief Complaint  Patient presents with   Abdominal Cramping    Bethany Harris is a 21 y.o. female.  HPI  Medical history including GERD, presents  with complaints of intense menstrual cramping.  Patient states that her menstrual cycle started yesterday, endorses that she is having pelvic pain as well as generalized stomach pain, she has had associated nausea and vomiting denies hematemesis or coffee-ground emesis and states that last bowel movement was today, denies melena or hematochezia, she has no significant abdominal history, no history of ovarian torsion's or ovarian cyst.  Patient states last menstrual cycle was June 2, notes that her menstrual are regular, she is not on birth control, states that she is not sexually active, states this feels like her typical menstrual cycle but does note that she has more bleeding than usual, she denies any urinary symptoms.  Patient also states that she did have a syncopal episode last night, states that she was in so much pain that she passed out, states that she has a slight headache but denies change in vision paresthesia or weakness of her lower extremities.  Reviewed patient's chart she has been seen here 2 other times for same presentation again with syncope from worsening menstrual cramping, she has had CT ab pelvis performed on 04/06 which was negative for acute findings.  Home Medications Prior to Admission medications   Medication Sig Start Date End Date Taking? Authorizing Provider  ibuprofen (ADVIL) 600 MG tablet Take 1 tablet (600 mg total) by mouth 2 (two) times daily as needed for up to 11 days for mild pain. 05/29/22 06/09/22 Yes Marcello Fennel, PA-C  ketoconazole (NIZORAL) 2 % shampoo apply two times per week, massage into scalp and leave in for 10 minutes before rinsing out 05/08/22  Yes Brendolyn Patty, MD   mometasone (ELOCON) 0.1 % cream Apply to affected itchy skin qd-bid prn, Avoid applying to face, groin, and axilla. Use as directed. Long-term use can cause thinning of the skin. 05/08/22  Yes Brendolyn Patty, MD  ketorolac (TORADOL) 10 MG tablet Take 1 tablet (10 mg total) by mouth every 6 (six) hours as needed. Patient not taking: Reported on 05/29/2022 03/31/22   Tacy Learn, PA-C      Allergies    Patient has no known allergies.    Review of Systems   Review of Systems  Constitutional:  Negative for chills and fever.  Respiratory:  Negative for shortness of breath.   Cardiovascular:  Negative for chest pain.  Gastrointestinal:  Positive for abdominal pain, nausea and vomiting. Negative for constipation and diarrhea.  Genitourinary:  Positive for menstrual problem and vaginal pain.  Neurological:  Positive for syncope. Negative for headaches.    Physical Exam Updated Vital Signs BP 107/67   Pulse 83   Temp 97.7 F (36.5 C) (Oral)   Resp 17   SpO2 99%  Physical Exam Vitals and nursing note reviewed.  Constitutional:      General: She is not in acute distress.    Appearance: She is not ill-appearing.  HENT:     Head: Normocephalic and atraumatic.     Comments: No deformity of  the head present no raccoon eyes or battle sign noted.    Nose: No congestion.  Eyes:     Conjunctiva/sclera: Conjunctivae normal.  Cardiovascular:     Rate and Rhythm:  Normal rate and regular rhythm.     Pulses: Normal pulses.     Heart sounds: No murmur heard.    No friction rub. No gallop.  Pulmonary:     Effort: No respiratory distress.     Breath sounds: No wheezing, rhonchi or rales.  Abdominal:     Palpations: Abdomen is soft.     Tenderness: There is abdominal tenderness. There is no right CVA tenderness or left CVA tenderness.     Comments: Abdomen nondistended, she has sharp stomach pain but worse in the lower abdomen, no guarding rebound has peritoneal sign negative Murphy sign  McBurney point.  Skin:    General: Skin is warm and dry.  Neurological:     Mental Status: She is alert.     Comments: Cranial nerves II through XII grossly intact, no difficulty with  word finding following two-step commands no real weakness present.  Psychiatric:        Mood and Affect: Mood normal.     ED Results / Procedures / Treatments   Labs (all labs ordered are listed, but only abnormal results are displayed) Labs Reviewed  URINALYSIS, ROUTINE W REFLEX MICROSCOPIC - Abnormal; Notable for the following components:      Result Value   Color, Urine RED (*)    APPearance HAZY (*)    Specific Gravity, Urine 1.004 (*)    Hgb urine dipstick MODERATE (*)    Protein, ur 100 (*)    Leukocytes,Ua MODERATE (*)    RBC / HPF >50 (*)    All other components within normal limits  COMPREHENSIVE METABOLIC PANEL - Abnormal; Notable for the following components:   Glucose, Bld 104 (*)    AST 14 (*)    All other components within normal limits  CBC WITH DIFFERENTIAL/PLATELET  LIPASE, BLOOD  PREGNANCY, URINE    EKG None  Radiology CT Abdomen Pelvis W Contrast  Result Date: 05/29/2022 CLINICAL DATA:  Right lower quadrant pain EXAM: CT ABDOMEN AND PELVIS WITH CONTRAST TECHNIQUE: Multidetector CT imaging of the abdomen and pelvis was performed using the standard protocol following bolus administration of intravenous contrast. RADIATION DOSE REDUCTION: This exam was performed according to the departmental dose-optimization program which includes automated exposure control, adjustment of the mA and/or kV according to patient size and/or use of iterative reconstruction technique. CONTRAST:  143m OMNIPAQUE IOHEXOL 300 MG/ML  SOLN COMPARISON:  03/02/2022 FINDINGS: Lower chest: No acute abnormality. Hepatobiliary: No focal liver abnormality is seen. No gallstones, gallbladder wall thickening, or biliary dilatation. Pancreas: Unremarkable. No pancreatic ductal dilatation or surrounding inflammatory  changes. Spleen: Normal in size without focal abnormality. Adrenals/Urinary Tract: Adrenal glands are within normal limits. Kidneys demonstrate a normal enhancement pattern bilaterally. No calculi or obstructive changes are seen. The bladder is well distended. Stomach/Bowel: The appendix is within normal limits. No obstructive or inflammatory changes of the colon are seen. Small bowel and stomach are within normal limits. Vascular/Lymphatic: No significant vascular findings are present. No enlarged abdominal or pelvic lymph nodes. Reproductive: Uterus and bilateral adnexa are unremarkable. Other: No abdominal wall hernia or abnormality. No abdominopelvic ascites. Musculoskeletal: No acute or significant osseous findings. IMPRESSION: No acute abnormality is noted. Electronically Signed   By: MInez CatalinaM.D.   On: 05/29/2022 02:20    Procedures Procedures    Medications Ordered in ED Medications  sodium chloride 0.9 % bolus 1,000 mL (1,000 mLs Intravenous New Bag/Given 05/29/22 0118)  ondansetron (ZOFRAN) injection 4 mg (4 mg Intravenous Given  05/29/22 0126)  HYDROmorphone (DILAUDID) injection 0.5 mg (0.5 mg Intravenous Given 05/29/22 0121)  metoCLOPramide (REGLAN) injection 10 mg (10 mg Intravenous Given 05/29/22 0124)  diphenhydrAMINE (BENADRYL) capsule 25 mg (25 mg Oral Given 05/29/22 0123)  iohexol (OMNIPAQUE) 300 MG/ML solution 100 mL (100 mLs Intravenous Contrast Given 05/29/22 0207)    ED Course/ Medical Decision Making/ A&P                           Medical Decision Making Amount and/or Complexity of Data Reviewed Labs: ordered. Radiology: ordered.  Risk Prescription drug management.   This patient presents to the ED for concern of abdominal pain, this involves an extensive number of treatment options, and is a complaint that carries with it a high risk of complications and morbidity.  The differential diagnosis includes bowel obstruction, appendicitis, diverticulitis, ovarian torsion/ovarian  cysts    Additional history obtained:  Additional history obtained from N/A External records from outside source obtained and reviewed including previous ED notes   Co morbidities that complicate the patient evaluation  Dysmenorrhea  Social Determinants of Health:  N/A    Lab Tests:  I Ordered, and personally interpreted labs.  The pertinent results include: CBC negative, CMP shows glucose of 104 AST 14, lipase 47 urine pregnancy negative, UA shows leukocytes red blood cells white blood cells no bacteria   Imaging Studies ordered:  I ordered imaging studies including CT abdomen pelvis I independently visualized and interpreted imaging which showed negative acute findings I agree with the radiologist interpretation   Cardiac Monitoring:  The patient was maintained on a cardiac monitor.  I personally viewed and interpreted the cardiac monitored which showed an underlying rhythm of: N/A   Medicines ordered and prescription drug management:  I ordered medication including fluids, antiemetics, pain medication I have reviewed the patients home medicines and have made adjustments as needed  Critical Interventions:  N/A    Reevaluation:  Presents with abdominal pain, notably tender in the lower abdomen, suspect this is possible, and for menstrual cycle but I am concerned for possible diverticulitis, appendicitis, ovarian torsion, I recommended pelvic exam but patient is deferring on this, will obtain basic lab work and proceed with CT on pelvis for further evaluation.  Reassessed resting comfortably states that her symptoms have resolved, she has no complaints and that she is ready for discharge.    Consultations Obtained:  N/A    Test Considered:  Transvaginal ultrasound-deferred as my suspicion for torsion is lower at this time, she has no adnexal mass present on exam, seems more consistent with likely dysmenorrhea.    Rule out Low suspicion for ectopic  pregnancy is urine pregnancy is negative.  I have low suspicion for PID as she denies vaginal discharge, she is not sexually active, more consistent with likely dysmenorrhea.  I have low suspicion for UTI, pyelo-, kidney stone denies any urinary symptoms no flank tenderness, UA is slightly abnormal with leukocytes red blood cells and white blood cells present with more secondary due to being on her menstrual cycle. I have low suspicion for liver or gallbladder abnormality as she has no right upper quadrant tenderness, liver enzymes, alk phos, T bili all within normal limits.  Low suspicion for pancreatitis as lipase is within normal limits.  Low suspicion for ruptured stomach ulcer as she has no peritoneal sign present on exam.  Low suspicion for bowel obstruction as abdomen is nondistended normal bowel sounds, so passing gas  and having normal bowel movements.  Low suspicion for complicated diverticulitis as she is nontoxic-appearing, vital signs reassuring no leukocytosis present.  Low suspicion for appendicitis as she has no right lower quadrant tenderness, vital signs reassuring.  I have low suspicion for CVA or intracranial head bleed no recent head trauma, not anticoag's, no focal deficit present on exam, headache is resolved her migraine cocktail likely secondary to pain.   Dispostion and problem list  After consideration of the diagnostic results and the patients response to treatment, I feel that the patent would benefit from discharge.  Dysmenorrhea-we will provide her with ibuprofen, and have her keep her appointment with OB/GYN which is scheduled on July 6.            Final Clinical Impression(s) / ED Diagnoses Final diagnoses:  Dysmenorrhea    Rx / DC Orders ED Discharge Orders          Ordered    ibuprofen (ADVIL) 600 MG tablet  2 times daily PRN        05/29/22 0246              Marcello Fennel, PA-C 05/29/22 0247    Teressa Lower, MD 05/29/22 [phone]

## 2022-05-29 NOTE — ED Triage Notes (Signed)
Patient started her period yesterday. She is having bad cramps. Said she passed out yesterday and her legs feel weak. Stated the color of her period blood was darker than normal.

## 2022-06-01 ENCOUNTER — Encounter: Payer: Self-pay | Admitting: Certified Nurse Midwife

## 2022-06-01 ENCOUNTER — Ambulatory Visit (INDEPENDENT_AMBULATORY_CARE_PROVIDER_SITE_OTHER): Payer: Medicaid Other | Admitting: Certified Nurse Midwife

## 2022-06-01 VITALS — BP 115/71 | HR 92 | Wt 117.0 lb

## 2022-06-01 DIAGNOSIS — N946 Dysmenorrhea, unspecified: Secondary | ICD-10-CM | POA: Diagnosis not present

## 2022-06-01 MED ORDER — DICYCLOMINE HCL 10 MG PO CAPS: 10.0000 mg | ORAL_CAPSULE | Freq: Three times a day (TID) | ORAL | 2 refills | Status: DC

## 2022-06-01 MED ORDER — IBUPROFEN 600 MG PO TABS: 600.0000 mg | ORAL_TABLET | Freq: Four times a day (QID) | ORAL | 3 refills | Status: DC | PRN

## 2022-06-01 NOTE — Progress Notes (Signed)
GYN presents for painful, heavy periods, NV lasting 6 days.

## 2022-06-01 NOTE — Progress Notes (Signed)
History:  Bethany Harris is a 21 y.o. [id number] who presents to clinic today for evaluation of heavy, painful periods that cause cramping into her abdomen and down into her legs. During her most recent cycle, she had severe cramping with an onset of gushing that made her pass out for approximately . When she woke up, she could not feel the tops of her legs so her father took her to the ED.   Has taken two different kinds of birth control pills in the past, states they make things worse. Does not want a LARC since she will soon be married.  Also concerned that her fiance is coming from [place] soon and she would like proof that she is a virgin. He and her father are very conservative (they are Pashtu) and it is very important to them that she is a virgin. She is not at all and has never been consensually sexually active.  Also notes weight loss from 64kg to 53kg since moving to the [place] - [place] food [place] "no good", now her family makes food and she wants to eat but gets full immediately.  Pt speaks English well, but Mahin (Dari interpreter) present to translate as needed.  The following portions of the patient's history were reviewed and updated as appropriate: allergies, current medications, family history, past medical history, social history, past surgical history and problem list.  Review of Systems:  Review of Systems  Constitutional:  Positive for weight loss (was 64kg before coming to [place] in Jan 2022, now down to 53kg). Negative for chills and fever.  Respiratory:  Negative for shortness of breath.   Cardiovascular:  Negative for chest pain and palpitations.  Gastrointestinal:  Positive for abdominal pain (throughout entire abdomen), nausea and vomiting (with menses).  Psychiatric/Behavioral:  The patient is nervous/anxious.    Objective:  Physical Exam BP 115/71   Pulse 92   Wt 117 lb (53.1 kg)   LMP 05/27/2022 (Exact Date)   BMI  21.40 kg/m  Physical Exam Vitals and nursing note reviewed.  Constitutional:      General: She is not in acute distress.    Appearance: Normal appearance. She is not ill-appearing.  HENT:     Head: Normocephalic and atraumatic.  Eyes:     Pupils: Pupils are equal, round, and reactive to light.  Cardiovascular:     Rate and Rhythm: Normal rate and regular rhythm.     Pulses: Normal pulses.  Pulmonary:     Effort: Pulmonary effort is normal.  Abdominal:     General: There is no distension.     Palpations: Abdomen is soft. There is no mass.     Tenderness: There is abdominal tenderness (especially over lower quadrants, pt is on 3rd day of menses). There is no guarding or rebound.  Genitourinary:    General: Normal vulva.  Musculoskeletal:        General: Normal range of motion.  Skin:    General: Skin is warm and dry.     Capillary Refill: Capillary refill takes less than 2 seconds.  Neurological:     Mental Status: She is alert and oriented to person, place, and time.  Psychiatric:        Mood and Affect: Mood normal.        Behavior: Behavior normal.        Thought Content: Thought content normal.        Judgment: Judgment normal.  Labs and Imaging Previous CT results reviewed, all normal.   Assessment & Plan:  1. Dysmenorrhea - Reviewed non-pharmacologic ways to reduce menstrual pain including regular meals, hydration, rest and stress modulation. Also encouraged daily dose of red raspberry leaf capsule. - Pt stated she took something called "buscopan" while in [place] and this worked well for her menstrual pain. She has a family member bringing her some - this is similar to scopolamine, listed as an anti-spasmodic. Will order Bentyl for her to try during this menstrual cycle. - Instructed to take ibuprofen q6hrs from the day before her cycle through the first 3 days to help with pain and heavy bleeding. - ibuprofen (ADVIL) 600 MG tablet; Take 1 tablet (600 mg total)  by mouth every 6 (six) hours as needed.  Dispense: 60 tablet; Refill: 3 - dicyclomine (BENTYL) 10 MG capsule; Take 1 capsule (10 mg total) by mouth 4 (four) times daily -  before meals and at bedtime.  Dispense: 60 capsule; Refill: 2 - CBC - Thyroid Panel With TSH  Discussed ways to build her appetite back up and printed letter stating she is a virgin.  Follow up PRN.  Bernerd Limbo, CNM 06/01/2022 1:19 PM

## 2022-06-02 LAB — CBC
Hematocrit: 38.3 % (ref 34.0–46.6)
Hemoglobin: 12.8 g/dL (ref 11.1–15.9)
MCH: 28.7 pg (ref 26.6–33.0)
MCHC: 33.4 g/dL (ref 31.5–35.7)
MCV: 86 fL (ref 79–97)
Platelets: 169 10*3/uL (ref 150–450)
RBC: 4.46 x10E6/uL (ref 3.77–5.28)
RDW: 11.9 % (ref 11.7–15.4)
WBC: 5 10*3/uL (ref 3.4–10.8)

## 2022-06-02 LAB — THYROID PANEL WITH TSH
Free Thyroxine Index: 1.9 (ref 1.2–4.9)
T3 Uptake Ratio: 27 % (ref 24–39)
T4, Total: 6.9 ug/dL (ref 4.5–12.0)
TSH: 1.27 u[IU]/mL (ref 0.450–4.500)

## 2022-07-18 ENCOUNTER — Encounter (HOSPITAL_COMMUNITY): Payer: Self-pay

## 2022-07-18 ENCOUNTER — Emergency Department (HOSPITAL_COMMUNITY)
Admission: EM | Admit: 2022-07-18 | Discharge: 2022-07-18 | Disposition: A | Discharge status: Home / Self Care | Payer: Medicaid Other | Attending: Emergency Medicine | Admitting: Emergency Medicine

## 2022-07-18 ENCOUNTER — Emergency Department (HOSPITAL_COMMUNITY): Payer: Medicaid Other

## 2022-07-18 DIAGNOSIS — W010XXA Fall on same level from slipping, tripping and stumbling without subsequent striking against object, initial encounter: Secondary | ICD-10-CM | POA: Insufficient documentation

## 2022-07-18 DIAGNOSIS — S60221A Contusion of right hand, initial encounter: Secondary | ICD-10-CM | POA: Diagnosis not present

## 2022-07-18 DIAGNOSIS — S6991XA Unspecified injury of right wrist, hand and finger(s), initial encounter: Secondary | ICD-10-CM | POA: Diagnosis present

## 2022-07-18 NOTE — ED Triage Notes (Signed)
Pt states she tripped and fell on her right hand on the 12th of this month. Pt states the pain and swelling to her right hand has not improved.

## 2022-07-18 NOTE — Discharge Instructions (Signed)
Return for any problem.  ?

## 2022-07-18 NOTE — ED Provider Notes (Signed)
[facility]-EMERGENCY DEPT Provider Note   CSN: [id number] Arrival date & time: 07/18/22  3614     History  Chief Complaint  Patient presents with   Hand Pain    Bethany Harris is a 21 y.o. female.  21 year old female with prior medical history as detailed below presents for evaluation.  Patient reports that she tripped and fell onto her right hand on August 12.  She complains of continued pain to the right hand after this fall.  She has not yet seen a provider for same complaint.  She is requesting x-ray.  She denies other injury.  She denies any break in the skin at the time of the injury.  Pain is localized over the fourth and fifth knuckles on the right hand.  The history is provided by the patient and medical records.  Hand Pain This is a new problem. The current episode started more than 1 week ago. The problem occurs constantly. The problem has not changed since onset.Pertinent negatives include no chest pain, no abdominal pain, no headaches and no shortness of breath.       Home Medications Prior to Admission medications   Medication Sig Start Date End Date Taking? Authorizing Provider  dicyclomine (BENTYL) 10 MG capsule Take 1 capsule (10 mg total) by mouth 4 (four) times daily -  before meals and at bedtime. 06/01/22   Bernerd Limbo, CNM  ibuprofen (ADVIL) 600 MG tablet Take 1 tablet (600 mg total) by mouth every 6 (six) hours as needed. 06/01/22   Bernerd Limbo, CNM  ketoconazole (NIZORAL) 2 % shampoo apply two times per week, massage into scalp and leave in for 10 minutes before rinsing out 05/08/22   Willeen Niece, MD  mometasone (ELOCON) 0.1 % cream Apply to affected itchy skin qd-bid prn, Avoid applying to face, groin, and axilla. Use as directed. Long-term use can cause thinning of the skin. 05/08/22   Willeen Niece, MD      Allergies    Patient has no known allergies.    Review of Systems   Review of Systems  Respiratory:  Negative  for shortness of breath.   Cardiovascular:  Negative for chest pain.  Gastrointestinal:  Negative for abdominal pain.  Neurological:  Negative for headaches.  All other systems reviewed and are negative.   Physical Exam Updated Vital Signs BP (!) 138/95 (BP Location: Left Arm)   Pulse 96   Temp 98.4 F (36.9 C) (Oral)   Resp 18   Ht 5\' 2"  (1.575 m)   Wt 54.4 kg   SpO2 100%   BMI 21.95 kg/m  Physical Exam Vitals and nursing note reviewed.  Constitutional:      General: She is not in acute distress.    Appearance: Normal appearance. She is well-developed.  HENT:     Head: Normocephalic and atraumatic.  Eyes:     Conjunctiva/sclera: Conjunctivae normal.     Pupils: Pupils are equal, round, and reactive to light.  Cardiovascular:     Rate and Rhythm: Normal rate and regular rhythm.  Pulmonary:     Effort: Pulmonary effort is normal. No respiratory distress.  Abdominal:     General: There is no distension.     Palpations: Abdomen is soft.     Tenderness: There is no abdominal tenderness.  Musculoskeletal:        General: Tenderness present. No deformity. Normal range of motion.     Right hand: Tenderness present.  Left hand: Normal.       Hands:     Cervical back: Normal range of motion and neck supple.  Skin:    General: Skin is warm and dry.  Neurological:     General: No focal deficit present.     Mental Status: She is alert and oriented to person, place, and time.     ED Results / Procedures / Treatments   Labs (all labs ordered are listed, but only abnormal results are displayed) Labs Reviewed - No data to display  EKG None  Radiology No results found.  Procedures Procedures    Medications Ordered in ED Medications - No data to display  ED Course/ Medical Decision Making/ A&P                           Medical Decision Making Amount and/or Complexity of Data Reviewed Radiology: ordered.    Medical Screen Complete  This patient  presented to the ED with complaint of right hand injury.  This complaint involves an extensive number of treatment options. The initial differential diagnosis includes, but is not limited to, contusion, fracture, etc.  This presentation is: Acute, Self-Limited, Previously Undiagnosed, and Uncertain Prognosis  Patient with reported fall and injury to right hand on August 12.  Patient complains of continued pain to the right hand.     Additional history obtained:  External records from outside sources obtained and reviewed including prior ED visits and prior Inpatient records.   Imaging Studies ordered:  I ordered imaging studies including plain films of the right hand I independently visualized and interpreted obtained imaging which showed NAD I agree with the radiologist interpretation.  Problem List / ED Course:  Hand contusion   Reevaluation:  After the interventions noted above, I reevaluated the patient and found that they have: improved   Disposition:  After consideration of the diagnostic results and the patients response to treatment, I feel that the patent would benefit from close outpatient followup.          Final Clinical Impression(s) / ED Diagnoses Final diagnoses:  Contusion of right hand, initial encounter    Rx / DC Orders ED Discharge Orders     None         Wynetta Fines, MD 07/18/22 [phone]

## 2022-07-29 ENCOUNTER — Other Ambulatory Visit: Payer: Self-pay

## 2022-07-29 ENCOUNTER — Emergency Department (HOSPITAL_COMMUNITY)
Admission: EM | Admit: 2022-07-29 | Discharge: 2022-07-30 | Disposition: A | Discharge status: Home / Self Care | Payer: Medicaid Other | Attending: Emergency Medicine | Admitting: Emergency Medicine

## 2022-07-29 ENCOUNTER — Emergency Department (HOSPITAL_COMMUNITY): Payer: Medicaid Other

## 2022-07-29 DIAGNOSIS — R000 Tachycardia, unspecified: Secondary | ICD-10-CM | POA: Diagnosis not present

## 2022-07-29 DIAGNOSIS — R519 Headache, unspecified: Secondary | ICD-10-CM | POA: Diagnosis present

## 2022-07-29 DIAGNOSIS — R110 Nausea: Secondary | ICD-10-CM | POA: Diagnosis not present

## 2022-07-29 DIAGNOSIS — H53149 Visual discomfort, unspecified: Secondary | ICD-10-CM | POA: Insufficient documentation

## 2022-07-29 LAB — PREGNANCY, URINE: Preg Test, Ur: NEGATIVE

## 2022-07-29 LAB — CBC WITH DIFFERENTIAL/PLATELET
Abs Immature Granulocytes: 0.02 10*3/uL (ref 0.00–0.07)
Basophils Absolute: 0.1 10*3/uL (ref 0.0–0.1)
Basophils Relative: 1 %
Eosinophils Absolute: 0.1 10*3/uL (ref 0.0–0.5)
Eosinophils Relative: 2 %
HCT: 39.7 % (ref 36.0–46.0)
Hemoglobin: 13.5 g/dL (ref 12.0–15.0)
Immature Granulocytes: 0 %
Lymphocytes Relative: 28 %
Lymphs Abs: 2.3 10*3/uL (ref 0.7–4.0)
MCH: 28.9 pg (ref 26.0–34.0)
MCHC: 34 g/dL (ref 30.0–36.0)
MCV: 85 fL (ref 80.0–100.0)
Monocytes Absolute: 0.7 10*3/uL (ref 0.1–1.0)
Monocytes Relative: 8 %
Neutro Abs: 5.2 10*3/uL (ref 1.7–7.7)
Neutrophils Relative %: 61 %
Platelets: 164 10*3/uL (ref 150–400)
RBC: 4.67 MIL/uL (ref 3.87–5.11)
RDW: 11.8 % (ref 11.5–15.5)
WBC: 8.5 10*3/uL (ref 4.0–10.5)
nRBC: 0 % (ref 0.0–0.2)

## 2022-07-29 LAB — URINALYSIS, ROUTINE W REFLEX MICROSCOPIC
Bacteria, UA: NONE SEEN
Bilirubin Urine: NEGATIVE
Glucose, UA: NEGATIVE mg/dL
Hgb urine dipstick: NEGATIVE
Ketones, ur: NEGATIVE mg/dL
Nitrite: NEGATIVE
Protein, ur: NEGATIVE mg/dL
Specific Gravity, Urine: 1.013 (ref 1.005–1.030)
pH: 8 (ref 5.0–8.0)

## 2022-07-29 LAB — COMPREHENSIVE METABOLIC PANEL
ALT: 13 U/L (ref 0–44)
AST: 17 U/L (ref 15–41)
Albumin: 4.3 g/dL (ref 3.5–5.0)
Alkaline Phosphatase: 71 U/L (ref 38–126)
Anion gap: 7 (ref 5–15)
BUN: 11 mg/dL (ref 6–20)
CO2: 23 mmol/L (ref 22–32)
Calcium: 9 mg/dL (ref 8.9–10.3)
Chloride: 110 mmol/L (ref 98–111)
Creatinine, Ser: 0.71 mg/dL (ref 0.44–1.00)
GFR, Estimated: >60 mL/min (ref >60)
Glucose, Bld: 102 mg/dL — ABNORMAL HIGH (ref 70–99)
Potassium: 3.5 mmol/L (ref 3.5–5.1)
Sodium: 140 mmol/L (ref 135–145)
Total Bilirubin: 0.9 mg/dL (ref 0.3–1.2)
Total Protein: 7.3 g/dL (ref 6.5–8.1)

## 2022-07-29 MED ORDER — ONDANSETRON HCL 4 MG/2ML IJ SOLN
4.0000 mg | Freq: Once | INTRAMUSCULAR | Status: AC
  Administered 2022-07-29: 4 mg via INTRAVENOUS
  Filled 2022-07-29: qty 2

## 2022-07-29 MED ORDER — SODIUM CHLORIDE 0.9 % IV BOLUS: 500.0000 mL | Freq: Once | INTRAVENOUS | Status: DC

## 2022-07-29 MED ORDER — LACTATED RINGERS IV BOLUS
1000.0000 mL | Freq: Once | INTRAVENOUS | Status: AC
  Administered 2022-07-29: 1000 mL via INTRAVENOUS

## 2022-07-29 MED ORDER — KETOROLAC TROMETHAMINE 15 MG/ML IJ SOLN
15.0000 mg | Freq: Once | INTRAMUSCULAR | Status: AC
  Administered 2022-07-30: 15 mg via INTRAVENOUS
  Filled 2022-07-29: qty 1

## 2022-07-29 MED ORDER — DIPHENHYDRAMINE HCL 50 MG/ML IJ SOLN
12.5000 mg | Freq: Once | INTRAMUSCULAR | Status: AC
  Administered 2022-07-29: 12.5 mg via INTRAVENOUS
  Filled 2022-07-29: qty 1

## 2022-07-29 NOTE — ED Notes (Signed)
Pt crawled out of wheelchair onto the floor sobbing. Assisted in getting back up into wheelchair.

## 2022-07-29 NOTE — ED Provider Notes (Signed)
  Physical Exam  BP 108/72   Pulse 77   Temp 98 F (36.7 C) (Oral)   Resp 17   Ht 5\' 2"  (1.575 m)   Wt 54.4 kg   LMP 06/29/2022   SpO2 100%   BMI 21.95 kg/m   Physical Exam  Procedures  Procedures  ED Course / MDM    Medical Decision Making Amount and/or Complexity of Data Reviewed Labs: ordered. Radiology:     Details: CT head Noncon visualized provider negative for acute intracranial abnormality.  Risk Prescription drug management.   ***  Given the patient is in from preceding ED provider 08/29/2022, PA-C at time of shift change.  Please see her associated note for further details brief ED course.  In brief this is a 21 year old female who has history of migraines for which she follows with [facility] neurology who presents today with headache since she woke up this morning that she described as severe generalized but worst in the left temporal region.  Associated with lightheadedness and nausea as well as some spots in her vision but without vomiting.  Endorses some numbness to left side of her face but no weakness.  Per preceding provider nonfocal neurologic exam, migraine cocktail ordered with the exception of pain medication pending pregnancy test.

## 2022-07-29 NOTE — ED Provider Notes (Signed)
[facility] Provider Note   CSN: [id number] Arrival date & time: 07/29/22  2052     History  Chief Complaint  Patient presents with   Headache    Bethany Harris is a 21 y.o. female.  With past medical history of GERD, syncope who presents to the emergency department with headache.  States that headache began this morning when she woke up.  She describes it as severe headache that is all over but also has more pain over her left temple.  She has had associated blurry vision and spots in her vision.  States that she has had nausea without vomiting.  She also feels dizzy and lightheaded.  States that she had some numbness to the left side of her face but denies weakness to her extremities.  States that she fell earlier today to her dizziness.  She denies any fevers, recent head or neck trauma.  She has had similar headaches previously and has seen neurology for this.  On chart review it appears that she saw [facility] neurology most recently on 11/14/2021.  At that time she was seen for syncope and headache.  She had an MRI brain and MRA which were unremarkable.  She also had a negative EEG.  She was diagnosed with migraine with aura.  For the syncope she was diagnosed with vasovagal etiology per cardiology.  Headache Associated symptoms: nausea and photophobia   Associated symptoms: no fever        Home Medications Prior to Admission medications   Medication Sig Start Date End Date Taking? Authorizing Provider  dicyclomine (BENTYL) 10 MG capsule Take 1 capsule (10 mg total) by mouth 4 (four) times daily -  before meals and at bedtime. 06/01/22   Bernerd Limbo, CNM  ibuprofen (ADVIL) 600 MG tablet Take 1 tablet (600 mg total) by mouth every 6 (six) hours as needed. 06/01/22   Bernerd Limbo, CNM  ketoconazole (NIZORAL) 2 % shampoo apply two times per week, massage into scalp and leave in for 10 minutes before rinsing out 05/08/22   Willeen Niece, MD   mometasone (ELOCON) 0.1 % cream Apply to affected itchy skin qd-bid prn, Avoid applying to face, groin, and axilla. Use as directed. Long-term use can cause thinning of the skin. 05/08/22   Willeen Niece, MD      Allergies    Patient has no known allergies.    Review of Systems   Review of Systems  Constitutional:  Negative for fever.  Eyes:  Positive for photophobia and visual disturbance.  Gastrointestinal:  Positive for nausea.  Neurological:  Positive for headaches.  All other systems reviewed and are negative.   Physical Exam Updated Vital Signs BP (!) 119/93 (BP Location: Right Arm)   Pulse (!) 108   Temp 98 F (36.7 C) (Oral)   Resp 20   Ht 5\' 2"  (1.575 m)   Wt 54.4 kg   LMP 06/29/2022   SpO2 100%   BMI 21.95 kg/m  Physical Exam Vitals and nursing note reviewed.  Constitutional:      General: She is not in acute distress.    Appearance: Normal appearance. She is ill-appearing. She is not toxic-appearing.  HENT:     Head: Normocephalic and atraumatic.     Mouth/Throat:     Mouth: Mucous membranes are moist.     Pharynx: Oropharynx is clear.  Eyes:     General: No scleral icterus.    Extraocular Movements: Extraocular movements intact.  Right eye: No nystagmus.     Left eye: No nystagmus.     Pupils: Pupils are equal, round, and reactive to light.  Cardiovascular:     Rate and Rhythm: Regular rhythm. Tachycardia present.     Heart sounds: Normal heart sounds. No murmur heard. Pulmonary:     Effort: Pulmonary effort is normal. No respiratory distress.  Abdominal:     Palpations: Abdomen is soft.     Tenderness: There is no abdominal tenderness.  Musculoskeletal:        General: Normal range of motion.     Cervical back: Neck supple.  Skin:    General: Skin is warm and dry.     Capillary Refill: Capillary refill takes less than 2 seconds.  Neurological:     General: No focal deficit present.     Mental Status: She is alert and oriented to person,  place, and time. Mental status is at baseline.     Cranial Nerves: No cranial nerve deficit, dysarthria or facial asymmetry.     Sensory: No sensory deficit.     Motor: No weakness.  Psychiatric:        Mood and Affect: Mood normal.        Speech: Speech normal.        Behavior: Behavior normal.        Thought Content: Thought content normal.        Judgment: Judgment normal.    ED Results / Procedures / Treatments   Labs (all labs ordered are listed, but only abnormal results are displayed) Labs Reviewed  COMPREHENSIVE METABOLIC PANEL  CBC WITH DIFFERENTIAL/PLATELET  URINALYSIS, ROUTINE W REFLEX MICROSCOPIC  I-STAT BETA HCG BLOOD, ED ([facility], [facility], [facility] ONLY)  I-STAT BETA HCG BLOOD, ED ([facility], [facility], [facility] ONLY)   EKG None  Radiology No results found.  Procedures Procedures    Medications Ordered in ED Medications  ondansetron (ZOFRAN) injection 4 mg (has no administration in time range)  diphenhydrAMINE (BENADRYL) injection 12.5 mg (has no administration in time range)  lactated ringers bolus 1,000 mL (has no administration in time range)   ED Course/ Medical Decision Making/ A&P                           Medical Decision Making Care of patient being handed off to [place], PA-C. Patient has had negative head CT. Pending laboratory work up, headache cocktail and reassessment. Will likely need neurology follow-up for better management of her migraines.  Final Clinical Impression(s) / ED Diagnoses Final diagnoses:  None    Rx / DC Orders ED Discharge Orders     None         Cristopher Peru, PA-C 07/29/22 2215    Lorre Nick, MD 07/30/22 [id number]

## 2022-07-29 NOTE — ED Triage Notes (Addendum)
Headache with vision changes starting today at 1000. Pt gets them and does take meds for them. She is unsure what name of that exact med is. She took tylenol and unknown med for headache today with no relief. Med is not listed in daily med record. Neuro intact. Pt due to start menstrual cycle tomorrow.

## 2022-07-30 NOTE — Discharge Instructions (Signed)
You were seen here today for your headache.  Your work-up is very reassuring, there is no emergent problem identified today.  Your pain is improved after medications in the ER.  Please follow-up in the outpatient setting with your neurologist and return to the ER with any new severe symptoms.

## 2022-08-15 ENCOUNTER — Ambulatory Visit: Payer: Medicaid Other | Admitting: Dermatology

## 2022-08-28 ENCOUNTER — Emergency Department (HOSPITAL_COMMUNITY)
Admission: EM | Admit: 2022-08-28 | Discharge: 2022-08-29 | Disposition: A | Discharge status: Home / Self Care | Payer: Medicaid Other | Attending: Emergency Medicine | Admitting: Emergency Medicine

## 2022-08-28 ENCOUNTER — Encounter (HOSPITAL_COMMUNITY): Payer: Self-pay

## 2022-08-28 ENCOUNTER — Other Ambulatory Visit: Payer: Self-pay

## 2022-08-28 DIAGNOSIS — R1084 Generalized abdominal pain: Secondary | ICD-10-CM | POA: Diagnosis not present

## 2022-08-28 DIAGNOSIS — N946 Dysmenorrhea, unspecified: Secondary | ICD-10-CM | POA: Insufficient documentation

## 2022-08-28 DIAGNOSIS — R109 Unspecified abdominal pain: Secondary | ICD-10-CM | POA: Diagnosis present

## 2022-08-28 LAB — CBC
HCT: 38.3 % (ref 36.0–46.0)
Hemoglobin: 12.9 g/dL (ref 12.0–15.0)
MCH: 28.5 pg (ref 26.0–34.0)
MCHC: 33.7 g/dL (ref 30.0–36.0)
MCV: 84.7 fL (ref 80.0–100.0)
Platelets: 170 10*3/uL (ref 150–400)
RBC: 4.52 MIL/uL (ref 3.87–5.11)
RDW: 11.9 % (ref 11.5–15.5)
WBC: 9.3 10*3/uL (ref 4.0–10.5)
nRBC: 0 % (ref 0.0–0.2)

## 2022-08-28 LAB — I-STAT BETA HCG BLOOD, ED (MC, WL, AP ONLY): I-stat hCG, quantitative: <5 m[IU]/mL (ref <5)

## 2022-08-28 MED ORDER — ONDANSETRON 4 MG PO TBDP
4.0000 mg | ORAL_TABLET | Freq: Once | ORAL | Status: AC | PRN
  Administered 2022-08-28: 4 mg via ORAL
  Filled 2022-08-28: qty 1

## 2022-08-28 NOTE — ED Triage Notes (Signed)
Patient arrives with abdominal pain. Stated her menstrual cycle started today and the pain is more intense and feels different. Patient states she took tylenol an ibuprofen or pain at home with no relief.

## 2022-08-29 ENCOUNTER — Encounter (HOSPITAL_COMMUNITY): Payer: Self-pay

## 2022-08-29 ENCOUNTER — Emergency Department (HOSPITAL_COMMUNITY): Payer: Medicaid Other

## 2022-08-29 LAB — COMPREHENSIVE METABOLIC PANEL
ALT: 13 U/L (ref 0–44)
AST: 16 U/L (ref 15–41)
Albumin: 4.3 g/dL (ref 3.5–5.0)
Alkaline Phosphatase: 64 U/L (ref 38–126)
Anion gap: 4 — ABNORMAL LOW (ref 5–15)
BUN: 14 mg/dL (ref 6–20)
CO2: 25 mmol/L (ref 22–32)
Calcium: 9.2 mg/dL (ref 8.9–10.3)
Chloride: 111 mmol/L (ref 98–111)
Creatinine, Ser: 0.71 mg/dL (ref 0.44–1.00)
GFR, Estimated: >60 mL/min (ref >60)
Glucose, Bld: 103 mg/dL — ABNORMAL HIGH (ref 70–99)
Potassium: 3.3 mmol/L — ABNORMAL LOW (ref 3.5–5.1)
Sodium: 140 mmol/L (ref 135–145)
Total Bilirubin: 0.8 mg/dL (ref 0.3–1.2)
Total Protein: 6.8 g/dL (ref 6.5–8.1)

## 2022-08-29 LAB — URINALYSIS, ROUTINE W REFLEX MICROSCOPIC
RBC / HPF: >50 RBC/hpf — ABNORMAL HIGH (ref 0–5)
WBC, UA: >50 WBC/hpf — ABNORMAL HIGH (ref 0–5)

## 2022-08-29 LAB — LIPASE, BLOOD: Lipase: 48 U/L (ref 11–51)

## 2022-08-29 MED ORDER — SODIUM CHLORIDE (PF) 0.9 % IJ SOLN
INTRAMUSCULAR | Status: AC
  Filled 2022-08-29: qty 50

## 2022-08-29 MED ORDER — IBUPROFEN 200 MG PO TABS
600.0000 mg | ORAL_TABLET | Freq: Once | ORAL | Status: AC
  Administered 2022-08-29: 600 mg via ORAL
  Filled 2022-08-29: qty 3

## 2022-08-29 MED ORDER — IOHEXOL 300 MG/ML  SOLN
100.0000 mL | Freq: Once | INTRAMUSCULAR | Status: AC | PRN
  Administered 2022-08-29: 100 mL via INTRAVENOUS

## 2022-08-29 MED ORDER — POTASSIUM CHLORIDE CRYS ER 20 MEQ PO TBCR
20.0000 meq | EXTENDED_RELEASE_TABLET | Freq: Once | ORAL | Status: AC
  Administered 2022-08-29: 20 meq via ORAL
  Filled 2022-08-29: qty 1

## 2022-08-29 MED ORDER — SODIUM CHLORIDE 0.9 % IV BOLUS
1000.0000 mL | Freq: Once | INTRAVENOUS | Status: AC
  Administered 2022-08-29: 1000 mL via INTRAVENOUS

## 2022-08-29 NOTE — ED Notes (Signed)
Hot Pack Compresses chaude given to pt to apply to her abd for comfort

## 2022-08-29 NOTE — ED Notes (Signed)
Patient does not take blood thinners.

## 2022-08-29 NOTE — ED Provider Notes (Signed)
[place] DEPT Provider Note   CSN: [id number] Arrival date & time: 08/28/22  2244     History  Chief Complaint  Patient presents with   Abdominal Cramping    Bethany Harris is a 21 y.o. female who presents to the ED complaining of abdominal cramping onset yesterday.  She notes that she started her period yesterday.  She does have OB/GYN specialist who she is followed by.  Has associated back pain.  Has tried over-the-counter Tylenol, ibuprofen, buscopan (antispasmodic), heating pads without relief of her symptoms.  Notes that she goes through 5-6 pads throughout the day.  Denies fever, dysuria, vaginal discharge, nausea, vomiting.    The history is provided by the patient. No language interpreter was used.       Home Medications Prior to Admission medications   Medication Sig Start Date End Date Taking? Authorizing Provider  dicyclomine (BENTYL) 10 MG capsule Take 1 capsule (10 mg total) by mouth 4 (four) times daily -  before meals and at bedtime. 06/01/22   Gabriel Carina, CNM  ibuprofen (ADVIL) 600 MG tablet Take 1 tablet (600 mg total) by mouth every 6 (six) hours as needed. 06/01/22   Gabriel Carina, CNM  ketoconazole (NIZORAL) 2 % shampoo apply two times per week, massage into scalp and leave in for 10 minutes before rinsing out 05/08/22   Brendolyn Patty, MD  mometasone (ELOCON) 0.1 % cream Apply to affected itchy skin qd-bid prn, Avoid applying to face, groin, and axilla. Use as directed. Long-term use can cause thinning of the skin. 05/08/22   Brendolyn Patty, MD      Allergies    Patient has no known allergies.    Review of Systems   Review of Systems  Constitutional:  Negative for fever.  Gastrointestinal:  Positive for abdominal pain (suprapubic). Negative for nausea and vomiting.  Genitourinary:  Negative for dysuria and vaginal discharge.  Musculoskeletal:  Positive for back pain.  All other systems reviewed and are  negative.   Physical Exam Updated Vital Signs BP 105/67 (BP Location: Left Arm)   Pulse 73   Temp 97.8 F (36.6 C) (Oral)   Resp 18   Ht 5\' 9"  (1.753 m)   Wt 56 kg   LMP 08/28/2022 (Exact Date)   SpO2 100%   BMI 18.23 kg/m  Physical Exam Vitals and nursing note reviewed.  Constitutional:      General: She is not in acute distress.    Appearance: She is not diaphoretic.  HENT:     Head: Normocephalic and atraumatic.     Mouth/Throat:     Pharynx: No oropharyngeal exudate.  Eyes:     General: No scleral icterus.    Conjunctiva/sclera: Conjunctivae normal.  Cardiovascular:     Rate and Rhythm: Normal rate and regular rhythm.     Pulses: Normal pulses.     Heart sounds: Normal heart sounds.  Pulmonary:     Effort: Pulmonary effort is normal. No respiratory distress.     Breath sounds: Normal breath sounds. No wheezing.  Abdominal:     General: Bowel sounds are normal.     Palpations: Abdomen is soft. There is no mass.     Tenderness: There is generalized abdominal tenderness. There is no guarding or rebound.     Comments: Diffuse abdominal tenderness to palpation.  Musculoskeletal:        General: Normal range of motion.     Cervical back: Normal range of motion and  neck supple.  Skin:    General: Skin is warm and dry.  Neurological:     Mental Status: She is alert.  Psychiatric:        Behavior: Behavior normal.     ED Results / Procedures / Treatments   Labs (all labs ordered are listed, but only abnormal results are displayed) Labs Reviewed  URINALYSIS, ROUTINE W REFLEX MICROSCOPIC - Abnormal; Notable for the following components:      Result Value   Color, Urine RED (*)    APPearance CLOUDY (*)    Glucose, UA   (*)    Value: TEST NOT REPORTED DUE TO COLOR INTERFERENCE OF URINE PIGMENT   Hgb urine dipstick   (*)    Value: TEST NOT REPORTED DUE TO COLOR INTERFERENCE OF URINE PIGMENT   Bilirubin Urine   (*)    Value: TEST NOT REPORTED DUE TO COLOR  INTERFERENCE OF URINE PIGMENT   Ketones, ur   (*)    Value: TEST NOT REPORTED DUE TO COLOR INTERFERENCE OF URINE PIGMENT   Protein, ur   (*)    Value: TEST NOT REPORTED DUE TO COLOR INTERFERENCE OF URINE PIGMENT   Nitrite   (*)    Value: TEST NOT REPORTED DUE TO COLOR INTERFERENCE OF URINE PIGMENT   Leukocytes,Ua   (*)    Value: TEST NOT REPORTED DUE TO COLOR INTERFERENCE OF URINE PIGMENT   RBC / HPF >50 (*)    WBC, UA >50 (*)    Bacteria, UA RARE (*)    All other components within normal limits  COMPREHENSIVE METABOLIC PANEL - Abnormal; Notable for the following components:   Potassium 3.3 (*)    Glucose, Bld 103 (*)    Anion gap 4 (*)    All other components within normal limits  LIPASE, BLOOD  CBC  I-STAT BETA HCG BLOOD, ED ([facility], [facility], [facility] ONLY)    EKG None  Radiology CT ABDOMEN PELVIS W CONTRAST  Result Date: 08/29/2022 CLINICAL DATA:  Abdominal/acute abdominal pain EXAM: CT ABDOMEN AND PELVIS WITH CONTRAST TECHNIQUE: Multidetector CT imaging of the abdomen and pelvis was performed using the standard protocol following bolus administration of intravenous contrast. RADIATION DOSE REDUCTION: This exam was performed according to the departmental dose-optimization program which includes automated exposure control, adjustment of the mA and/or kV according to patient size and/or use of iterative reconstruction technique. CONTRAST:  145mL OMNIPAQUE IOHEXOL 300 MG/ML  SOLN COMPARISON:  05/29/2022 FINDINGS: Lower chest:  No contributory findings. Hepatobiliary: No focal liver abnormality.No evidence of biliary obstruction or stone. Pancreas: Unremarkable. Spleen: Unremarkable. Adrenals/Urinary Tract: Negative adrenals. No hydronephrosis or stone. Unremarkable bladder. Stomach/Bowel:  No obstruction. No appendicitis. Vascular/Lymphatic: No acute vascular abnormality. No mass or adenopathy. Reproductive:No pathologic findings. Other: No ascites or pneumoperitoneum. Musculoskeletal: No acute  abnormalities. IMPRESSION: Negative abdominal CT. Electronically Signed   By: Jorje Guild M.D.   On: 08/29/2022 06:34    Procedures Procedures    Medications Ordered in ED Medications  ondansetron (ZOFRAN-ODT) disintegrating tablet 4 mg (4 mg Oral Given 08/28/22 2320)  ibuprofen (ADVIL) tablet 600 mg (600 mg Oral Given 08/29/22 0157)  sodium chloride 0.9 % bolus 1,000 mL (0 mLs Intravenous Stopped 08/29/22 0646)  potassium chloride SA (KLOR-CON M) CR tablet 20 mEq (20 mEq Oral Given 08/29/22 0547)  sodium chloride (PF) 0.9 % injection (  Given by Other 08/29/22 0554)  iohexol (OMNIPAQUE) 300 MG/ML solution 100 mL (100 mLs Intravenous Contrast Given 08/29/22 0610)  ED Course/ Medical Decision Making/ A&P Clinical Course as of 08/29/22 [id number]  Tue Aug 29, 2022  0640 Discussed with patient imaging findings and discharge treatment plan.  Answered all available questions.  Patient for safe for discharge at this time. [SB]    Clinical Course User Index [SB] Ahnya Akre A, PA-C                           Medical Decision Making Amount and/or Complexity of Data Reviewed Labs: ordered. Radiology: ordered.  Risk OTC drugs. Prescription drug management.   Patient presents to the emergency department with concerns for abdominal cramping onset yesterday.  Started her menstrual cycle yesterday.  Notes this is similar for her menstrual cycle pains.  Has tried over-the-counter medication without relief.  Does have OB/GYN at this time.  Patient afebrile.  On exam patient with diffuse abdominal tenderness to palpation, no focal abdominal pain. Differential diagnosis includes pancreatitis, cholecystitis, appendicitis, UTI, GERD, diverticulitis, dysmenorrhea.  Additional history obtained:  External records from outside source obtained and reviewed including: Patient has been evaluated by her OB/GYN specialist for dysmenorrhea with her most recent visit being on 06/01/2022.  Labs:  I ordered, and  personally interpreted labs.  The pertinent results include:  I-STAT beta-hCG undetectable. Urinalysis unable to be run due to patient on menstrual cycle. CMP with slightly decreased potassium at 3.3, this is repleted in the emergency department. Lipase unremarkable. CBC without leukocytosis and unremarkable.  Imaging: I ordered imaging studies including CT abdomen pelvis I independently visualized and interpreted imaging which showed no acute abnormalities I agree with the radiologist interpretation  Medications:  I ordered medication including IV fluids, potassium, Zofran for symptom management.  Reevaluation of the patient after these medicines and interventions, I reevaluated the patient and found that they have improved I have reviewed the patients home medicines and have made adjustments as needed   Disposition: Presentation suspicious for generalized abdominal pain in the setting of dysmenorrhea.  Doubt pancreatitis, cholecystitis, diverticulitis, appendicitis at this time. After consideration of the diagnostic results and the patients response to treatment, I feel that the patient would benefit from Discharge home. Patient will be discharged home with instructions to follow-up with her OB/GYN specialist regarding today's ED visit.  Supportive care measures and strict return precautions discussed with patient at bedside. Pt acknowledges and verbalizes understanding. Pt appears safe for discharge. Follow up as indicated in discharge paperwork.   This chart was dictated using voice recognition software, Dragon. Despite the best efforts of this provider to proofread and correct errors, errors may still occur which can change documentation meaning.    Final Clinical Impression(s) / ED Diagnoses Final diagnoses:  Generalized abdominal pain  Dysmenorrhea    Rx / DC Orders ED Discharge Orders     None         Cristy Colmenares A, PA-C 08/29/22 0705    Jeanell Sparrow,  DO 08/29/22 2319

## 2022-08-29 NOTE — Discharge Instructions (Addendum)
It was a pleasure take care of you today!  Your labs are overall unremarkable today.  Your potassium was slightly decreased however this was replenished in the emergency department.  Your CT scan did not show any concerning findings.  You may take over-the-counter 600 mg ibuprofen every 6 hours and alternate with 500 mg Tylenol every 6 hours as needed for symptoms for no more than 7 days.  You may place a warm compress to the area for up to 15 minutes at a time, ensure to place barrier between your skin and the warm compress.  It is important that you call your OB/GYN specialist today to set up a follow-up appointment regarding today's ED visit.  You may continue with your prescription medications as prescribed.  Return to the emergency department for experiencing increasing/worsening symptoms.

## 2022-09-05 ENCOUNTER — Encounter: Payer: Self-pay | Admitting: Obstetrics and Gynecology

## 2022-09-05 ENCOUNTER — Other Ambulatory Visit (HOSPITAL_COMMUNITY)
Admission: RE | Admit: 2022-09-05 | Discharge: 2022-09-05 | Disposition: A | Discharge status: Home / Self Care | Payer: Medicaid Other | Source: Ambulatory Visit | Attending: Obstetrics and Gynecology | Admitting: Obstetrics and Gynecology

## 2022-09-05 ENCOUNTER — Ambulatory Visit (INDEPENDENT_AMBULATORY_CARE_PROVIDER_SITE_OTHER): Payer: Medicaid Other | Admitting: Obstetrics and Gynecology

## 2022-09-05 VITALS — BP 106/67 | HR 81 | Ht 63.0 in | Wt 118.0 lb

## 2022-09-05 DIAGNOSIS — N946 Dysmenorrhea, unspecified: Secondary | ICD-10-CM

## 2022-09-05 DIAGNOSIS — R102 Pelvic and perineal pain: Secondary | ICD-10-CM | POA: Diagnosis not present

## 2022-09-05 DIAGNOSIS — N760 Acute vaginitis: Secondary | ICD-10-CM

## 2022-09-05 DIAGNOSIS — N920 Excessive and frequent menstruation with regular cycle: Secondary | ICD-10-CM | POA: Diagnosis not present

## 2022-09-05 MED ORDER — KETOROLAC TROMETHAMINE 10 MG PO TABS: 10.0000 mg | ORAL_TABLET | Freq: Four times a day (QID) | ORAL | 5 refills | Status: AC | PRN

## 2022-09-05 MED ORDER — ONDANSETRON HCL 4 MG PO TABS: 4.0000 mg | ORAL_TABLET | Freq: Three times a day (TID) | ORAL | 0 refills | Status: DC | PRN

## 2022-09-05 NOTE — Progress Notes (Addendum)
Subjective:  CC: dysmenorrhea, heavy menses    Patient ID: Bethany Harris, female    DOB: 2001-05-29, 21 y.o.   MRN: [id number] D[place] interpreter present  HPI Has had lifelong painful and heavy menses. Previoulsy prescribed (bascopen?) with initial relief but minimal relief since arrival to the [place]. 2nd and 3rd day are most painful days. Initially given birth control pills to help but stopped after discussion with her family including her fiance as she is not yet a 'woman' and therefore should not be taking birth control. During her menses, she has sharp, stabbing pain that is a 10/10, in her pelvis, that is non-radiating. Feels that the pain is so significant that she can't walk/move her legs very well and often has to present to the ED. When she gets IV analgesia, the pain typically resolves. Not currently sexually active. Occasional vaginal discharge with vulvar itching, with or without menses, most recently 2 days ago. During menses will experience dyschezia and dysuria without hematuria or hematochezia.  Menarche 16 years 6 days long, monthly cycles  No prior pregnancies; will be getting married in the next 4 months or so and would like to conceive afterwards.   Review of Systems     Objective:  BP 106/67   Pulse 81   Ht 5\' 3"  (1.6 m)   Wt 118 lb (53.5 kg)   LMP 08/28/2022 (Exact Date) Comment: negative beta HCG 08/28/22  BMI 20.90 kg/m    Physical Exam Vitals and nursing note reviewed. Exam conducted with a chaperone present.  Constitutional:      Appearance: Normal appearance.  HENT:     Head: Normocephalic and atraumatic.  Cardiovascular:     Rate and Rhythm: Normal rate and regular rhythm.  Pulmonary:     Effort: Pulmonary effort is normal.     Breath sounds: Normal breath sounds.  Genitourinary:    General: Normal vulva.     Labia:        Right: No rash or tenderness.        Left: No rash or tenderness.      Comments: Notable discomfort with 1-2cm insertion of vaginitis  swab Skin:    General: Skin is warm and dry.  Neurological:     General: No focal deficit present.     Mental Status: She is alert.  Psychiatric:        Mood and Affect: Mood normal.        Behavior: Behavior normal.        Thought Content: Thought content normal.        Judgment: Judgment normal.    Pelvic US (10/2021):  IMPRESSION: 1. Age-appropriate pelvic ultrasound.  No acute findings.     Assessment & Plan:   1. Dysmenorrhea Patient with primary dysmenorrhea, possible component of endometriosis given severity of pain and minimal improvement with OTC analgesia. Currently analgesia not helping and declines hormonal suppression at this time. Will trial po ketoralac with zofran for nausea. Reviewed short duration of use and will revaluate effectiveness in about 3-4 months.   2. Menorrhagia with regular cycle Discussed hormonal interventions are typical recommendation, can consider non-hormonal TXA. More bothered by pain will attempt to manage that first.   3. Acute vaginitis Vaginitis swab collected today (although patient initially hesitant to get testing due to the test being a vaginal swab).   4. Pelvic pain Will refer to pelvic PT with note that internal interventions not a possibility at this time  Darliss Cheney, MD 09/05/22

## 2022-09-05 NOTE — Addendum Note (Signed)
Addended by: Lucianne Lei on: 09/05/2022 05:11 PM   Modules accepted: Orders

## 2022-09-05 NOTE — Progress Notes (Signed)
Pt present for menorrhagia and dysmenorrhea.  Menses come every 28-29 days and lasts for 7 days. She has never had IC.

## 2022-09-07 LAB — CERVICOVAGINAL ANCILLARY ONLY
Bacterial Vaginitis (gardnerella): NEGATIVE
Candida Glabrata: NEGATIVE
Candida Vaginitis: NEGATIVE
Comment: NEGATIVE
Comment: NEGATIVE
Comment: NEGATIVE

## 2022-09-20 ENCOUNTER — Emergency Department (HOSPITAL_COMMUNITY)
Admission: EM | Admit: 2022-09-20 | Discharge: 2022-09-20 | Disposition: A | Discharge status: Home / Self Care | Payer: Medicaid Other | Attending: Emergency Medicine | Admitting: Emergency Medicine

## 2022-09-20 ENCOUNTER — Encounter (HOSPITAL_COMMUNITY): Payer: Self-pay

## 2022-09-20 ENCOUNTER — Other Ambulatory Visit: Payer: Self-pay

## 2022-09-20 DIAGNOSIS — N3000 Acute cystitis without hematuria: Secondary | ICD-10-CM | POA: Insufficient documentation

## 2022-09-20 DIAGNOSIS — M545 Low back pain, unspecified: Secondary | ICD-10-CM | POA: Diagnosis present

## 2022-09-20 LAB — COMPREHENSIVE METABOLIC PANEL
ALT: 12 U/L (ref 0–44)
AST: 17 U/L (ref 15–41)
Albumin: 4.3 g/dL (ref 3.5–5.0)
Alkaline Phosphatase: 68 U/L (ref 38–126)
Anion gap: 7 (ref 5–15)
BUN: 16 mg/dL (ref 6–20)
CO2: 23 mmol/L (ref 22–32)
Calcium: 9.1 mg/dL (ref 8.9–10.3)
Chloride: 108 mmol/L (ref 98–111)
Creatinine, Ser: 0.8 mg/dL (ref 0.44–1.00)
GFR, Estimated: >60 mL/min (ref >60)
Glucose, Bld: 76 mg/dL (ref 70–99)
Potassium: 4.3 mmol/L (ref 3.5–5.1)
Sodium: 138 mmol/L (ref 135–145)
Total Bilirubin: 0.9 mg/dL (ref 0.3–1.2)
Total Protein: 7 g/dL (ref 6.5–8.1)

## 2022-09-20 LAB — URINALYSIS, ROUTINE W REFLEX MICROSCOPIC
Bilirubin Urine: NEGATIVE
Glucose, UA: NEGATIVE mg/dL
Hgb urine dipstick: NEGATIVE
Ketones, ur: NEGATIVE mg/dL
Nitrite: NEGATIVE
Protein, ur: NEGATIVE mg/dL
Specific Gravity, Urine: 1.014 (ref 1.005–1.030)
pH: 6 (ref 5.0–8.0)

## 2022-09-20 LAB — CBC WITH DIFFERENTIAL/PLATELET
Abs Immature Granulocytes: 0.02 10*3/uL (ref 0.00–0.07)
Basophils Absolute: 0 10*3/uL (ref 0.0–0.1)
Basophils Relative: 0 %
Eosinophils Absolute: 0.1 10*3/uL (ref 0.0–0.5)
Eosinophils Relative: 1 %
HCT: 38.8 % (ref 36.0–46.0)
Hemoglobin: 13.1 g/dL (ref 12.0–15.0)
Immature Granulocytes: 0 %
Lymphocytes Relative: 22 %
Lymphs Abs: 1.7 10*3/uL (ref 0.7–4.0)
MCH: 28.7 pg (ref 26.0–34.0)
MCHC: 33.8 g/dL (ref 30.0–36.0)
MCV: 84.9 fL (ref 80.0–100.0)
Monocytes Absolute: 0.6 10*3/uL (ref 0.1–1.0)
Monocytes Relative: 8 %
Neutro Abs: 5.3 10*3/uL (ref 1.7–7.7)
Neutrophils Relative %: 69 %
Platelets: 172 10*3/uL (ref 150–400)
RBC: 4.57 MIL/uL (ref 3.87–5.11)
RDW: 11.9 % (ref 11.5–15.5)
WBC: 7.8 10*3/uL (ref 4.0–10.5)
nRBC: 0 % (ref 0.0–0.2)

## 2022-09-20 LAB — PREGNANCY, URINE: Preg Test, Ur: NEGATIVE

## 2022-09-20 MED ORDER — DEXAMETHASONE SODIUM PHOSPHATE 10 MG/ML IJ SOLN
10.0000 mg | Freq: Once | INTRAMUSCULAR | Status: AC
  Administered 2022-09-20: 10 mg via INTRAMUSCULAR
  Filled 2022-09-20: qty 1

## 2022-09-20 MED ORDER — FOSFOMYCIN TROMETHAMINE 3 G PO PACK
3.0000 g | PACK | Freq: Once | ORAL | Status: AC
  Administered 2022-09-20: 3 g via ORAL
  Filled 2022-09-20: qty 3

## 2022-09-20 MED ORDER — NAPROXEN 375 MG PO TABS: 375.0000 mg | ORAL_TABLET | Freq: Two times a day (BID) | ORAL | 0 refills | Status: DC

## 2022-09-20 MED ORDER — CYCLOBENZAPRINE HCL 10 MG PO TABS: 5.0000 mg | ORAL_TABLET | Freq: Two times a day (BID) | ORAL | 0 refills | Status: DC | PRN

## 2022-09-20 MED ORDER — KETOROLAC TROMETHAMINE 60 MG/2ML IM SOLN
60.0000 mg | Freq: Once | INTRAMUSCULAR | Status: AC
  Administered 2022-09-20: 60 mg via INTRAMUSCULAR
  Filled 2022-09-20: qty 2

## 2022-09-20 NOTE — ED Provider Notes (Signed)
[facility] DEPT Provider Note   CSN: [id number] Arrival date & time: 09/20/22  1205     History  Chief Complaint  Patient presents with   Back Pain    Bethany Harris is a 21 y.o. female who presents to the emergency department chief complaint of back pain.  Patient complains of moderate to severe low back pain on the right side that has been going on for about 2 weeks after moving heavy objects in her home.  She complains that the pain hurts into her hip and down her right leg.  She is having some discomfort in her suprapubic region.  She is a virgin she is concerned that she been having some vaginal discharge however had a swab done 15 days ago at the [facility] which was negative for anything.  Her urine done on the third of this month.  Somewhat equivocal.  We will repeat this exam to rule out infection.  She denies fevers, chills.  Back Pain      Home Medications Prior to Admission medications   Medication Sig Start Date End Date Taking? Authorizing Provider  cyclobenzaprine (FLEXERIL) 10 MG tablet Take 0.5-1 tablets (5-10 mg total) by mouth 2 (two) times daily as needed for muscle spasms. 09/20/22  Yes Rowan Blaker, PA-C  naproxen (NAPROSYN) 375 MG tablet Take 1 tablet (375 mg total) by mouth 2 (two) times daily with a meal. 09/20/22  Yes Kenniya Westrich, PA-C  dicyclomine (BENTYL) 10 MG capsule Take 1 capsule (10 mg total) by mouth 4 (four) times daily -  before meals and at bedtime. Patient not taking: Reported on 09/05/2022 06/01/22   Gabriel Carina, CNM  ibuprofen (ADVIL) 600 MG tablet Take 1 tablet (600 mg total) by mouth every 6 (six) hours as needed. Patient not taking: Reported on 09/05/2022 06/01/22   Gabriel Carina, CNM  ketoconazole (NIZORAL) 2 % shampoo apply two times per week, massage into scalp and leave in for 10 minutes before rinsing out Patient not taking: Reported on 09/05/2022 05/08/22   Brendolyn Patty, MD   mometasone (ELOCON) 0.1 % cream Apply to affected itchy skin qd-bid prn, Avoid applying to face, groin, and axilla. Use as directed. Long-term use can cause thinning of the skin. Patient not taking: Reported on 09/05/2022 05/08/22   Brendolyn Patty, MD  ondansetron (ZOFRAN) 4 MG tablet Take 1 tablet (4 mg total) by mouth every 8 (eight) hours as needed for nausea or vomiting (DURING MENSEES). 09/05/22   Darliss Cheney, MD      Allergies    Patient has no known allergies.    Review of Systems   Review of Systems  Musculoskeletal:  Positive for back pain.    Physical Exam Updated Vital Signs BP 110/80   Pulse 87   Temp 98.4 F (36.9 C) (Oral)   Resp 16   Ht 5\' 3"  (1.6 m)   Wt 54 kg   LMP 08/28/2022 (Exact Date) Comment: negative beta HCG 08/28/22  SpO2 100%   BMI 21.08 kg/m  Physical Exam Vitals and nursing note reviewed.  Constitutional:      General: She is not in acute distress.    Appearance: She is well-developed. She is not diaphoretic.  HENT:     Head: Normocephalic and atraumatic.     Right Ear: External ear normal.     Left Ear: External ear normal.     Nose: Nose normal.     Mouth/Throat:  Mouth: Mucous membranes are moist.  Eyes:     General: No scleral icterus.    Conjunctiva/sclera: Conjunctivae normal.  Cardiovascular:     Rate and Rhythm: Normal rate and regular rhythm.     Heart sounds: Normal heart sounds. No murmur heard.    No friction rub. No gallop.  Pulmonary:     Effort: Pulmonary effort is normal. No respiratory distress.     Breath sounds: Normal breath sounds.  Abdominal:     General: Bowel sounds are normal. There is no distension.     Palpations: Abdomen is soft. There is no mass.     Tenderness: There is no abdominal tenderness. There is no guarding.  Musculoskeletal:     Cervical back: Normal range of motion.     Comments: Patient appears to be in mild to moderate pain, antalgic gait noted. Lumbosacral spine area reveals no local  tenderness or mass. Painful and reduced LS ROM noted. Straight leg raise is positive at 40 degrees on right. DTR's, motor strength and sensation normal, including heel and toe gait.  Peripheral pulses are palpable.   Skin:    General: Skin is warm and dry.  Neurological:     Mental Status: She is alert and oriented to person, place, and time.  Psychiatric:        Behavior: Behavior normal.     ED Results / Procedures / Treatments   Labs (all labs ordered are listed, but only abnormal results are displayed) Labs Reviewed  URINALYSIS, ROUTINE W REFLEX MICROSCOPIC - Abnormal; Notable for the following components:      Result Value   APPearance HAZY (*)    Leukocytes,Ua LARGE (*)    Bacteria, UA MANY (*)    All other components within normal limits  COMPREHENSIVE METABOLIC PANEL  CBC WITH DIFFERENTIAL/PLATELET  PREGNANCY, URINE    EKG None  Radiology No results found.  Procedures Procedures    Medications Ordered in ED Medications  ketorolac (TORADOL) injection 60 mg (60 mg Intramuscular Given 09/20/22 1848)  dexamethasone (DECADRON) injection 10 mg (10 mg Intramuscular Given 09/20/22 1851)  fosfomycin (MONUROL) packet 3 g (3 g Oral Given 09/20/22 1954)    ED Course/ Medical Decision Making/ A&P                           Medical Decision Making Patient here wth c/o back pain.  The emergent differential diagnosis for back pain includes but is not limited to fracture, muscle strain, cauda equina, spinal stenosis. DDD, ankylosing spondylitis, acute ligamentous injury, disk herniation, spondylolisthesis, Epidural compression syndrome, metastatic cancer, transverse myelitis, vertebral osteomyelitis, diskitis, kidney stone, pyelonephritis, AAA, Perforated ulcer, Retrocecal appendicitis, pancreatitis, bowel obstruction, retroperitoneal hemorrhage or mass, meningitis.  I suspect that her back pain is musculoskeletal and likely represents sciatica givne her physical exam.  No  loss of bowel or bladder control.  No concern for cauda equina.  No fever, night sweats, weight loss, h/o cancer, IVDU.   Secondly - she appears to have a urinary tract infection.  She has no CVA tenderness, labs are reassuring without leukocytosis and I do not think that her back pain represents pyelonephritis.  Given these findings we will treat with fosfomycin.  Be discharged with naproxen and Flexeril for her back pain.  She may follow-up with her PCP.   Social determinants of health include slight language barrier, immigrant  Patient appears otherwise appropriate for discharge at this time.  Amount and/or  Complexity of Data Reviewed Labs: ordered.    Details: No acute findings  Risk Prescription drug management.    Final Clinical Impression(s) / ED Diagnoses Final diagnoses:  Acute cystitis without hematuria    Rx / DC Orders ED Discharge Orders          Ordered    naproxen (NAPROSYN) 375 MG tablet  2 times daily with meals        09/20/22 1948    cyclobenzaprine (FLEXERIL) 10 MG tablet  2 times daily PRN        09/20/22 1948              Margarita Mail, PA-C 09/20/22 2349    Lacretia Leigh, MD 09/21/22 2155

## 2022-09-20 NOTE — ED Provider Triage Note (Addendum)
Emergency Medicine Provider Triage Evaluation Note  Bethany Harris , a 21 y.o. female  was evaluated in triage.  Pt complains of back pain.  Patient reports right-sided back pain that is been present for approximately past 2 weeks.  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  She reports taking Tylenol and ibuprofen at home with some relief of symptoms.  Denies saddle anesthesia, bowel/bladder dysfunction, weakness/sensory deficits in lower legs, fever.  She reports some radiation to her right lower abdomen as well as down her right leg.  She also reports some white vaginal discharge.  Denies nausea, vomiting, history of kidney stone.  Last menstrual period October 2.  Review of Systems  Positive: See above Negative:  Physical Exam  BP 107/67   Pulse 90   Temp 98.4 F (36.9 C) (Oral)   Resp 18   Ht 5\' 3"  (1.6 m)   Wt 54 kg   LMP 08/28/2022 (Exact Date) Comment: negative beta HCG 08/28/22  SpO2 98%   BMI 21.08 kg/m  Gen:   Awake, no distress   Resp:  Normal effort  MSK:   Moves extremities without difficulty  Other:  Right-sided paraspinal tenderness in lumbar region, +/- CVA right side.  Medical Decision Making  Medically screening exam initiated at 12:58 PM.  Appropriate orders placed.  Xaviera Lafon was informed that the remainder of the evaluation will be completed by another provider, this initial triage assessment does not replace that evaluation, and the importance of remaining in the ED until their evaluation is complete.     Wilnette Kales, [place] 09/20/22 [place], [place], [place] 09/20/22 1302

## 2022-09-20 NOTE — ED Triage Notes (Signed)
Pt to er, pt c/o back pain, states that the pain shoots down her R leg.  Pt denies urinary symptoms, states that the pain wraps around to her abd.  Pt states that she had some white discharge.

## 2022-09-20 NOTE — Discharge Instructions (Addendum)
SEEK IMMEDIATE MEDICAL ATTENTION IF: New numbness, tingling, weakness, or problem with the use of your arms or legs.  Severe back pain not relieved with medications.  Change in bowel or bladder control.  Increasing pain in any areas of the body (such as chest or abdominal pain).  Shortness of breath, dizziness or fainting.  Nausea (feeling sick to your stomach), vomiting, fever, or sweats.  

## 2022-09-27 ENCOUNTER — Encounter (HOSPITAL_COMMUNITY): Payer: Self-pay

## 2022-09-27 ENCOUNTER — Other Ambulatory Visit: Payer: Self-pay

## 2022-09-27 ENCOUNTER — Emergency Department (HOSPITAL_COMMUNITY)
Admission: EM | Admit: 2022-09-27 | Discharge: 2022-09-28 | Disposition: A | Discharge status: Home / Self Care | Payer: Medicaid Other | Attending: Emergency Medicine | Admitting: Emergency Medicine

## 2022-09-27 DIAGNOSIS — M5441 Lumbago with sciatica, right side: Secondary | ICD-10-CM | POA: Diagnosis not present

## 2022-09-27 DIAGNOSIS — M5431 Sciatica, right side: Secondary | ICD-10-CM

## 2022-09-27 DIAGNOSIS — M545 Low back pain, unspecified: Secondary | ICD-10-CM | POA: Diagnosis present

## 2022-09-27 NOTE — ED Triage Notes (Signed)
Pt reports with lower back pain that goes into her right leg x 3 weeks. Pt states that she is unable to walk. Pt reports being seen for same issue and has been taking her muscle relaxer and Naproxen.

## 2022-09-28 ENCOUNTER — Encounter: Payer: Self-pay | Admitting: Nurse Practitioner

## 2022-09-28 ENCOUNTER — Ambulatory Visit (INDEPENDENT_AMBULATORY_CARE_PROVIDER_SITE_OTHER): Payer: Medicaid Other | Admitting: Nurse Practitioner

## 2022-09-28 VITALS — BP 104/63 | HR 88 | Temp 97.9°F | Ht 64.0 in | Wt 117.6 lb

## 2022-09-28 DIAGNOSIS — N3000 Acute cystitis without hematuria: Secondary | ICD-10-CM

## 2022-09-28 LAB — URINALYSIS, ROUTINE W REFLEX MICROSCOPIC
Bacteria, UA: NONE SEEN
Bilirubin Urine: NEGATIVE
Glucose, UA: NEGATIVE mg/dL
Hgb urine dipstick: NEGATIVE
Ketones, ur: NEGATIVE mg/dL
Nitrite: NEGATIVE
Protein, ur: NEGATIVE mg/dL
Specific Gravity, Urine: 1.024 (ref 1.005–1.030)
pH: 5 (ref 5.0–8.0)

## 2022-09-28 LAB — PREGNANCY, URINE: Preg Test, Ur: NEGATIVE

## 2022-09-28 MED ORDER — FLUCONAZOLE 150 MG PO TABS: 150.0000 mg | ORAL_TABLET | Freq: Every day | ORAL | 0 refills | Status: DC

## 2022-09-28 MED ORDER — SULFAMETHOXAZOLE-TRIMETHOPRIM 800-160 MG PO TABS: 1.0000 | ORAL_TABLET | Freq: Two times a day (BID) | ORAL | 0 refills | Status: AC

## 2022-09-28 NOTE — Discharge Instructions (Signed)
You were seen in the emergency room today for evaluation of your back pain.  This sounds like sciatica to me.  Like for you to continue taking the medications you were given upon discharge last time, the naproxen, and Flexeril.  Please take these as prescribed.  Additionally, given that this is a repeat time for you presenting with this pain.  I would like for you to follow-up with another provider for this.  I have put in information for Dr. Clearance Coots who is a sports medicine provider for you to follow-up with for this problem.  Of also included information for a primary care doctor for you to follow-up with as well.  If you have any bowel movements or urinating on yourself, any fevers, any numbness to your groin, weakness to your legs, please return to the nearest emergency room for evaluation.  Contact a doctor if: Your pain is not controlled by medicine. Your pain does not get better. Your pain gets worse. Your pain lasts longer than 4 weeks. You lose weight without trying. Get help right away if: You cannot control when you pee (urinate) or poop (have a bowel movement). You have weakness in any of these areas and it gets worse: Lower back. The area between your hip bones. Butt. Legs. You have redness or swelling of your back. You have a burning feeling when you pee.

## 2022-09-28 NOTE — Patient Instructions (Signed)
1. Acute cystitis without hematuria  - sulfamethoxazole-trimethoprim (BACTRIM DS) 800-160 MG tablet; Take 1 tablet by mouth 2 (two) times daily for 3 days.  Dispense: 6 tablet; Refill: 0  - Continue cyclobenzaprine and naproxen for back pain  - rest back for the next seven days while taking medications - NO HEAVY LIFTING, PUSHING, or PULLING  - stay well hydrated  -may use ice packs to back   Follow up:  Follow up as needed    Acute Back Pain, Adult  Acute back pain is sudden and usually short-lived. It is often caused by an injury to the muscles and tissues in the back. The injury may result from: A muscle, tendon, or ligament getting overstretched or torn. Ligaments are tissues that connect bones to each other. Lifting something improperly can cause a back strain. Wear and tear (degeneration) of the spinal disks. Spinal disks are circular tissue that provide cushioning between the bones of the spine (vertebrae). Twisting motions, such as while playing sports or doing yard work. A hit to the back. Arthritis. You may have a physical exam, lab tests, and imaging tests to find the cause of your pain. Acute back pain usually goes away with rest and home care. Follow these instructions at home: Managing pain, stiffness, and swelling Take over-the-counter and prescription medicines only as told by your health care provider. Treatment may include medicines for pain and inflammation that are taken by mouth or applied to the skin, or muscle relaxants. Your health care provider may recommend applying ice during the first 24-48 hours after your pain starts. To do this: Put ice in a plastic bag. Place a towel between your skin and the bag. Leave the ice on for 20 minutes, 2-3 times a day. Remove the ice if your skin turns bright red. This is very important. If you cannot feel pain, heat, or cold, you have a greater risk of damage to the area. If directed, apply heat to the affected area as  often as told by your health care provider. Use the heat source that your health care provider recommends, such as a moist heat pack or a heating pad. Place a towel between your skin and the heat source. Leave the heat on for 20-30 minutes. Remove the heat if your skin turns bright red. This is especially important if you are unable to feel pain, heat, or cold. You have a greater risk of getting burned. Activity  Do not stay in bed. Staying in bed for more than 1-2 days can delay your recovery. Sit up and stand up straight. Avoid leaning forward when you sit or hunching over when you stand. If you work at a desk, sit close to it so you do not need to lean over. Keep your chin tucked in. Keep your neck drawn back, and keep your elbows bent at a 90-degree angle (right angle). Sit high and close to the steering wheel when you drive. Add lower back (lumbar) support to your car seat, if needed. Take short walks on even surfaces as soon as you are able. Try to increase the length of time you walk each day. Do not sit, drive, or stand in one place for more than 30 minutes at a time. Sitting or standing for long periods of time can put stress on your back. Do not drive or use heavy machinery while taking prescription pain medicine. Use proper lifting techniques. When you bend and lift, use positions that put less stress on your back:  Bend your knees. Keep the load close to your body. Avoid twisting. Exercise regularly as told by your health care provider. Exercising helps your back heal faster and helps prevent back injuries by keeping muscles strong and flexible. Work with a physical therapist to make a safe exercise program, as recommended by your health care provider. Do any exercises as told by your physical therapist. Lifestyle Maintain a healthy weight. Extra weight puts stress on your back and makes it difficult to have good posture. Avoid activities or situations that make you feel anxious or  stressed. Stress and anxiety increase muscle tension and can make back pain worse. Learn ways to manage anxiety and stress, such as through exercise. General instructions Sleep on a firm mattress in a comfortable position. Try lying on your side with your knees slightly bent. If you lie on your back, put a pillow under your knees. Keep your head and neck in a straight line with your spine (neutral position) when using electronic equipment like smartphones or pads. To do this: Raise your smartphone or pad to look at it instead of bending your head or neck to look down. Put the smartphone or pad at the level of your face while looking at the screen. Follow your treatment plan as told by your health care provider. This may include: Cognitive or behavioral therapy. Acupuncture or massage therapy. Meditation or yoga. Contact a health care provider if: You have pain that is not relieved with rest or medicine. You have increasing pain going down into your legs or buttocks. Your pain does not improve after 2 weeks. You have pain at night. You lose weight without trying. You have a fever or chills. You develop nausea or vomiting. You develop abdominal pain. Get help right away if: You develop new bowel or bladder control problems. You have unusual weakness or numbness in your arms or legs. You feel faint. These symptoms may represent a serious problem that is an emergency. Do not wait to see if the symptoms will go away. Get medical help right away. Call your local emergency services (911 in the [place] Do not drive yourself to the hospital. Summary Acute back pain is sudden and usually short-lived. Use proper lifting techniques. When you bend and lift, use positions that put less stress on your back. Take over-the-counter and prescription medicines only as told by your health care provider, and apply heat or ice as told. This information is not intended to replace advice given to you by your  health care provider. Make sure you discuss any questions you have with your health care provider. Document Revised: 02/04/2021 Document Reviewed: 02/04/2021 Elsevier Patient Education  2023 [employer].

## 2022-09-28 NOTE — Progress Notes (Signed)
@Patient  ID: Bethany Harris, female    DOB: 02-06-2001, 21 y.o.   MRN: [id number]  Chief Complaint  Patient presents with  . Back Pain    Pt is complaining of lower back pain, worse on the right side started X3 weeks ago Pt was seen in ER last night for same and given a muscle relaxer with no relief     Referring provider: Bo Merino I, NP   HPI  Patient presents today for back pain.  She also complains urination.  She was seen in ED last night.  It was noted that she does have a UTI she was not prescribed any antibiotics.  She was prescribed cyclobenzaprine and naproxen.  We will order a short course of Bactrim and difluan. Denies f/c/s, n/v/d, hemoptysis, PND, leg swelling Denies chest pain or edema      No Known Allergies   There is no immunization history on file for this patient.  Past Medical History:  Diagnosis Date  . GERD (gastroesophageal reflux disease)     Tobacco History: Social History   Tobacco Use  Smoking Status Never  . Passive exposure: Never  Smokeless Tobacco Never   Counseling given: Not Answered   Outpatient Encounter Medications as of 09/28/2022  Medication Sig  . cyclobenzaprine (FLEXERIL) 10 MG tablet Take 0.5-1 tablets (5-10 mg total) by mouth 2 (two) times daily as needed for muscle spasms.  . fluconazole (DIFLUCAN) 150 MG tablet Take 1 tablet (150 mg total) by mouth daily.  . naproxen (NAPROSYN) 375 MG tablet Take 1 tablet (375 mg total) by mouth 2 (two) times daily with a meal.  . sulfamethoxazole-trimethoprim (BACTRIM DS) 800-160 MG tablet Take 1 tablet by mouth 2 (two) times daily for 3 days.  Marland Kitchen dicyclomine (BENTYL) 10 MG capsule Take 1 capsule (10 mg total) by mouth 4 (four) times daily -  before meals and at bedtime. (Patient not taking: Reported on 09/05/2022)  . ibuprofen (ADVIL) 600 MG tablet Take 1 tablet (600 mg total) by mouth every 6 (six) hours as needed. (Patient not taking: Reported on 09/05/2022)  . ketoconazole  (NIZORAL) 2 % shampoo apply two times per week, massage into scalp and leave in for 10 minutes before rinsing out (Patient not taking: Reported on 09/05/2022)  . mometasone (ELOCON) 0.1 % cream Apply to affected itchy skin qd-bid prn, Avoid applying to face, groin, and axilla. Use as directed. Long-term use can cause thinning of the skin. (Patient not taking: Reported on 09/05/2022)  . ondansetron (ZOFRAN) 4 MG tablet Take 1 tablet (4 mg total) by mouth every 8 (eight) hours as needed for nausea or vomiting (DURING MENSEES). (Patient not taking: Reported on 09/28/2022)   No facility-administered encounter medications on file as of 09/28/2022.     Review of Systems  Review of Systems  Constitutional: Negative.   HENT: Negative.    Cardiovascular: Negative.   Gastrointestinal: Negative.   Genitourinary:  Positive for dysuria and frequency.  Musculoskeletal:  Positive for back pain.  Allergic/Immunologic: Negative.   Neurological: Negative.   Psychiatric/Behavioral: Negative.         Physical Exam  BP 104/63 (BP Location: Left Arm, Patient Position: Sitting, Cuff Size: Normal)   Pulse 88   Temp 97.9 F (36.6 C) (Oral)   Ht 5\' 4"  (1.626 m)   Wt 117 lb 9.6 oz (53.3 kg)   LMP 08/28/2022 (Exact Date) Comment: negative beta HCG 08/28/22  SpO2 100%   BMI 20.19 kg/m   Wt Readings  from Last 5 Encounters:  09/28/22 117 lb 9.6 oz (53.3 kg)  09/27/22 118 lb (53.5 kg)  09/20/22 119 lb (54 kg)  09/05/22 118 lb (53.5 kg)  08/28/22 123 lb 7.3 oz (56 kg)     Physical Exam Vitals and nursing note reviewed.  Constitutional:      General: She is not in acute distress.    Appearance: She is well-developed.  Cardiovascular:     Rate and Rhythm: Normal rate and regular rhythm.  Pulmonary:     Effort: Pulmonary effort is normal.     Breath sounds: Normal breath sounds.  Musculoskeletal:     Lumbar back: Tenderness present.       Back:  Neurological:     Mental Status: She is alert and  oriented to person, place, and time.     Lab Results:  CBC    Component Value Date/Time   WBC 7.8 09/20/2022 1305   RBC 4.57 09/20/2022 1305   HGB 13.1 09/20/2022 1305   HGB 12.8 06/01/2022 1042   HCT 38.8 09/20/2022 1305   HCT 38.3 06/01/2022 1042   PLT 172 09/20/2022 1305   PLT 169 06/01/2022 1042   MCV 84.9 09/20/2022 1305   MCV 86 06/01/2022 1042   MCH 28.7 09/20/2022 1305   MCHC 33.8 09/20/2022 1305   RDW 11.9 09/20/2022 1305   RDW 11.9 06/01/2022 1042   LYMPHSABS 1.7 09/20/2022 1305   MONOABS 0.6 09/20/2022 1305   EOSABS 0.1 09/20/2022 1305   BASOSABS 0.0 09/20/2022 1305    BMET    Component Value Date/Time   NA 138 09/20/2022 1305   K 4.3 09/20/2022 1305   CL 108 09/20/2022 1305   CO2 23 09/20/2022 1305   GLUCOSE 76 09/20/2022 1305   BUN 16 09/20/2022 1305   CREATININE 0.80 09/20/2022 1305   CALCIUM 9.1 09/20/2022 1305   GFRNONAA >60 09/20/2022 1305    BNP No results found for: "BNP"  ProBNP No results found for: "PROBNP"  Imaging: No results found.   Assessment & Plan:   1. Acute cystitis without hematuria  - sulfamethoxazole-trimethoprim (BACTRIM DS) 800-160 MG tablet; Take 1 tablet by mouth 2 (two) times daily for 3 days.  Dispense: 6 tablet; Refill: 0  - Continue cyclobenzaprine and naproxen for back pain  - rest back for the next seven days while taking medications - NO HEAVY LIFTING, PUSHING, or PULLING  - stay well hydrated  -may use ice packs to back   Follow up:  Follow up as needed    Acute Back Pain, Adult  Acute back pain is sudden and usually short-lived. It is often caused by an injury to the muscles and tissues in the back. The injury may result from: A muscle, tendon, or ligament getting overstretched or torn. Ligaments are tissues that connect bones to each other. Lifting something improperly can cause a back strain. Wear and tear (degeneration) of the spinal disks. Spinal disks are circular tissue that provide  cushioning between the bones of the spine (vertebrae). Twisting motions, such as while playing sports or doing yard work. A hit to the back. Arthritis. You may have a physical exam, lab tests, and imaging tests to find the cause of your pain. Acute back pain usually goes away with rest and home care. Follow these instructions at home: Managing pain, stiffness, and swelling Take over-the-counter and prescription medicines only as told by your health care provider. Treatment may include medicines for pain and inflammation that are taken  by mouth or applied to the skin, or muscle relaxants. Your health care provider may recommend applying ice during the first 24-48 hours after your pain starts. To do this: Put ice in a plastic bag. Place a towel between your skin and the bag. Leave the ice on for 20 minutes, 2-3 times a day. Remove the ice if your skin turns bright red. This is very important. If you cannot feel pain, heat, or cold, you have a greater risk of damage to the area. If directed, apply heat to the affected area as often as told by your health care provider. Use the heat source that your health care provider recommends, such as a moist heat pack or a heating pad. Place a towel between your skin and the heat source. Leave the heat on for 20-30 minutes. Remove the heat if your skin turns bright red. This is especially important if you are unable to feel pain, heat, or cold. You have a greater risk of getting burned. Activity  Do not stay in bed. Staying in bed for more than 1-2 days can delay your recovery. Sit up and stand up straight. Avoid leaning forward when you sit or hunching over when you stand. If you work at a desk, sit close to it so you do not need to lean over. Keep your chin tucked in. Keep your neck drawn back, and keep your elbows bent at a 90-degree angle (right angle). Sit high and close to the steering wheel when you drive. Add lower back (lumbar) support to your car  seat, if needed. Take short walks on even surfaces as soon as you are able. Try to increase the length of time you walk each day. Do not sit, drive, or stand in one place for more than 30 minutes at a time. Sitting or standing for long periods of time can put stress on your back. Do not drive or use heavy machinery while taking prescription pain medicine. Use proper lifting techniques. When you bend and lift, use positions that put less stress on your back: [place] your knees. Keep the load close to your body. Avoid twisting. Exercise regularly as told by your health care provider. Exercising helps your back heal faster and helps prevent back injuries by keeping muscles strong and flexible. Work with a physical therapist to make a safe exercise program, as recommended by your health care provider. Do any exercises as told by your physical therapist. Lifestyle Maintain a healthy weight. Extra weight puts stress on your back and makes it difficult to have good posture. Avoid activities or situations that make you feel anxious or stressed. Stress and anxiety increase muscle tension and can make back pain worse. Learn ways to manage anxiety and stress, such as through exercise. General instructions Sleep on a firm mattress in a comfortable position. Try lying on your side with your knees slightly bent. If you lie on your back, put a pillow under your knees. Keep your head and neck in a straight line with your spine (neutral position) when using electronic equipment like smartphones or pads. To do this: Raise your smartphone or pad to look at it instead of bending your head or neck to look down. Put the smartphone or pad at the level of your face while looking at the screen. Follow your treatment plan as told by your health care provider. This may include: Cognitive or behavioral therapy. Acupuncture or massage therapy. Meditation or yoga. Contact a health care provider if: You have  pain that is not  relieved with rest or medicine. You have increasing pain going down into your legs or buttocks. Your pain does not improve after 2 weeks. You have pain at night. You lose weight without trying. You have a fever or chills. You develop nausea or vomiting. You develop abdominal pain. Get help right away if: You develop new bowel or bladder control problems. You have unusual weakness or numbness in your arms or legs. You feel faint. These symptoms may represent a serious problem that is an emergency. Do not wait to see if the symptoms will go away. Get medical help right away. Call your local emergency services (911 in the [place] Do not drive yourself to the hospital. Summary Acute back pain is sudden and usually short-lived. Use proper lifting techniques. When you bend and lift, use positions that put less stress on your back. Take over-the-counter and prescription medicines only as told by your health care provider, and apply heat or ice as told. This information is not intended to replace advice given to you by your health care provider. Make sure you discuss any questions you have with your health care provider. Document Revised: 02/04/2021 Document Reviewed: 02/04/2021 Elsevier Patient Education  [address]     Ivonne Andrew, [place] 09/28/2022

## 2022-09-28 NOTE — ED Provider Notes (Signed)
[facility]-EMERGENCY DEPT Provider Note   CSN: [id number] Arrival date & time: 09/27/22  2311     History Chief Complaint  Patient presents with   Back Pain   Leg Pain    Bethany Harris is a 21 y.o. female otherwise healthy presents to the ER for evaluation of right-sided back pain that radiates down her right leg.  Patient reports that she is now having more pain with walking and reports it is irritated more with walking.  She said this pain for the past 3 weeks.  Reports constant, not worsening.  She reports that she tried the naproxen and Flexeril that she was given last week in the emergency room and reports that it helps a little and helps her sleep at night.  She denies any fecal or urinary incontinence, saddle anesthesia, IV drug use, or any fevers.  Denies any falls.  Denies any urinary symptoms.   Back Pain Associated symptoms: leg pain   Associated symptoms: no abdominal pain, no chest pain, no dysuria and no fever   Leg Pain Associated symptoms: back pain   Associated symptoms: no fever        Home Medications Prior to Admission medications   Medication Sig Start Date End Date Taking? Authorizing Provider  cyclobenzaprine (FLEXERIL) 10 MG tablet Take 0.5-1 tablets (5-10 mg total) by mouth 2 (two) times daily as needed for muscle spasms. 09/20/22   Arthor Captain, PA-C  dicyclomine (BENTYL) 10 MG capsule Take 1 capsule (10 mg total) by mouth 4 (four) times daily -  before meals and at bedtime. Patient not taking: Reported on 09/05/2022 06/01/22   Bernerd Limbo, CNM  ibuprofen (ADVIL) 600 MG tablet Take 1 tablet (600 mg total) by mouth every 6 (six) hours as needed. Patient not taking: Reported on 09/05/2022 06/01/22   Bernerd Limbo, CNM  ketoconazole (NIZORAL) 2 % shampoo apply two times per week, massage into scalp and leave in for 10 minutes before rinsing out Patient not taking: Reported on 09/05/2022 05/08/22   Willeen Niece, MD  mometasone  (ELOCON) 0.1 % cream Apply to affected itchy skin qd-bid prn, Avoid applying to face, groin, and axilla. Use as directed. Long-term use can cause thinning of the skin. Patient not taking: Reported on 09/05/2022 05/08/22   Willeen Niece, MD  naproxen (NAPROSYN) 375 MG tablet Take 1 tablet (375 mg total) by mouth 2 (two) times daily with a meal. 09/20/22   Harris, Abigail, PA-C  ondansetron (ZOFRAN) 4 MG tablet Take 1 tablet (4 mg total) by mouth every 8 (eight) hours as needed for nausea or vomiting (DURING MENSEES). 09/05/22   Lorriane Shire, MD      Allergies    Patient has no known allergies.    Review of Systems   Review of Systems  Constitutional:  Negative for chills and fever.  Respiratory:  Negative for shortness of breath.   Cardiovascular:  Negative for chest pain and palpitations.  Gastrointestinal:  Negative for abdominal pain, nausea and vomiting.  Genitourinary:  Negative for dysuria and hematuria.  Musculoskeletal:  Positive for back pain.    Physical Exam Updated Vital Signs BP 111/73 (BP Location: Left Arm)   Pulse 91   Temp 97.9 F (36.6 C) (Oral)   Resp 17   Ht 5\' 4"  (1.626 m)   Wt 53.5 kg   LMP 08/28/2022 (Exact Date) Comment: negative beta HCG 08/28/22  SpO2 100%   BMI 20.25 kg/m  Physical Exam Vitals and nursing  note reviewed.  Constitutional:      General: She is not in acute distress.    Appearance: Normal appearance. She is not ill-appearing or toxic-appearing.     Comments: Sitting comfortably, on phone, eating [employer]  Eyes:     General: No scleral icterus. Pulmonary:     Effort: Pulmonary effort is normal. No respiratory distress.  Musculoskeletal:        General: Tenderness present.     Right lower leg: No edema.     Left lower leg: No edema.     Comments: Tenderness to the right SI joint area.  No midline tenderness.  No increased warmth to the midline spine.  No step-offs are deformities noted.  She does have a positive straight leg  raise on the right.  No swelling noted to the legs.  Compartments are soft.  Sensation intact.  She is ambulatory and weightbearing with an analgesic gait.  Skin:    General: Skin is dry.     Findings: No rash.  Neurological:     General: No focal deficit present.     Mental Status: She is alert. Mental status is at baseline.  Psychiatric:        Mood and Affect: Mood normal.     ED Results / Procedures / Treatments   Labs (all labs ordered are listed, but only abnormal results are displayed) Labs Reviewed  URINALYSIS, ROUTINE W REFLEX MICROSCOPIC  PREGNANCY, URINE    EKG None  Radiology No results found.  Procedures Procedures   Medications Ordered in ED Medications - No data to display  ED Course/ Medical Decision Making/ A&P                           Medical Decision Making Amount and/or Complexity of Data Reviewed Labs: ordered.   21 year old female presents the emergency department for evaluation of lower back pain and leg pain.  Differential diagnosis includes but is not limited to sciatica, arthritis, cauda equina, discitis, UTI, DVT.  Vital signs are unremarkable.  Patient normotensive, afebrile, normal pulse rate, satting well on room air with any increased work of breathing.  Physical exam as noted above.  On previous chart review, patient was seen here previously for this on 09-20-2022.  She was discharged home on naproxen and a muscle laxer.  She reports that she has tried this once a day and has had some relief of pain, however it does not completely resolve.  She reports that she is fine when at rest, but her pain is worse with walking or with standing for prolonged periods of time.  She did have a UTI at this time, will recheck to see if it has gone away.  Pregnancy test is negative.  Urinalysis not consistent with any infection.  I doubt any cauda equina as patient is not having any urinary fecal incontinence.  She is not having any saddle anesthesia,  fevers, or any history of IV drug use.  She is ambulatory and weightbearing.  Vital signs are not consistent with any sepsis or infection.  I recommend patient continue taking the medications at a more scheduled time and not just as needed to help with the inflammation.  We will refer her to a sports medicine provider for further evaluation as she may eventually need further imaging.  I do not see any need for any emergent imaging at this time as she does not have any red flag symptoms.  I doubt any DVT of her leg as the pain is from her back and radiates to her leg.  She does not have any swelling, her sensations intact.  I discussed this with my attending who agrees that she can follow-up with outpatient for imaging.  No additional testing to be performed at this time.  I discussed with the patient to continue using the medication she was prescribed.  Recommended using topical lidocaine patches to the area as well.  We discussed her need to follow-up with sports medicine for possible additional imaging.  Referred her to her primary care doctor as well as I see that she has had multiple visits to the ER within the past few months for different etiologies.  We discussed return precautions and red flag symptoms.  Patient verbalized understanding and agrees the plan.  Patient is stable being discharged home in good condition.  I discussed this case with my attending physician who cosigned this note including patient's presenting symptoms, physical exam, and planned diagnostics and interventions. Attending physician stated agreement with plan or made changes to plan which were implemented.    Final Clinical Impression(s) / ED Diagnoses Final diagnoses:  Sciatica of right side    Rx / DC Orders ED Discharge Orders     None         Sherrell Puller, PA-C 09/28/22 1704    Ripley Fraise, MD 10/04/22 1246

## 2022-09-29 ENCOUNTER — Encounter: Payer: Self-pay | Admitting: Nurse Practitioner

## 2022-10-02 ENCOUNTER — Telehealth: Payer: Self-pay

## 2022-10-03 NOTE — Telephone Encounter (Signed)
Error. KH 

## 2022-10-10 ENCOUNTER — Ambulatory Visit: Payer: Medicaid Other | Admitting: Family Medicine

## 2022-10-13 ENCOUNTER — Ambulatory Visit: Payer: Medicaid Other | Admitting: Family Medicine

## 2022-10-18 ENCOUNTER — Encounter: Payer: Self-pay | Admitting: Family Medicine

## 2022-10-18 ENCOUNTER — Ambulatory Visit (INDEPENDENT_AMBULATORY_CARE_PROVIDER_SITE_OTHER): Payer: Medicaid Other | Admitting: Family Medicine

## 2022-10-18 VITALS — BP 100/62 | Ht 64.0 in | Wt 117.0 lb

## 2022-10-18 DIAGNOSIS — M5416 Radiculopathy, lumbar region: Secondary | ICD-10-CM | POA: Diagnosis present

## 2022-10-18 HISTORY — DX: Radiculopathy, lumbar region: M54.16

## 2022-10-18 MED ORDER — PREDNISONE 5 MG PO TABS: ORAL_TABLET | ORAL | 0 refills | Status: DC

## 2022-10-18 MED ORDER — HYDROCODONE-ACETAMINOPHEN 5-325 MG PO TABS: 1.0000 | ORAL_TABLET | Freq: Three times a day (TID) | ORAL | 0 refills | Status: DC | PRN

## 2022-10-18 NOTE — Progress Notes (Signed)
  Bethany Harris - 21 y.o. female MRN [id number]  Date of birth: 2001/08/01  SUBJECTIVE:  Including CC & ROS.  No chief complaint on file.   Bethany Harris is a 21 y.o. female that is  here for low back pain and right sided radicular pain. Ongoing for about a month. Worse with lifting or bending. No history of surgery.  Review of the emergency apartment note so she was counseled supportive care and provided naproxen and Flexeril Review of the office note from 11/2 shows she was provided Bactrim and Diflucan Independent review of the CT abdomen and pelvis from 10/3 shows no acute changes  Review of Systems See HPI   HISTORY: Past Medical, Surgical, Social, and Family History Reviewed & Updated per EMR.   Pertinent Historical Findings include:  Past Medical History:  Diagnosis Date   GERD (gastroesophageal reflux disease)     History reviewed. No pertinent surgical history.   PHYSICAL EXAM:  VS: BP 100/62 (BP Location: Left Arm, Patient Position: Sitting)   Ht 5\' 4"  (1.626 m)   Wt 117 lb (53.1 kg)   BMI 20.08 kg/m  Physical Exam Gen: NAD, alert, cooperative with exam, well-appearing MSK:  Neurovascularly intact       ASSESSMENT & PLAN:   Lumbar radiculopathy Acutely occurring. Exam reassuring as well as previous CT scan. Consistent with a nerve impingement.  - counseled on home exercise therapy and supportive care - prednisone  - norco - could consider PT or trigger point injections.

## 2022-10-18 NOTE — Assessment & Plan Note (Signed)
Acutely occurring. Exam reassuring as well as previous CT scan. Consistent with a nerve impingement.  - counseled on home exercise therapy and supportive care - prednisone  - norco - could consider PT or trigger point injections.

## 2022-10-18 NOTE — Patient Instructions (Signed)
Nice to meet you Please use heat as needed  Please try the exercises  Please use the pain medicine as needed  Please send me a message in MyChart with any questions or updates.  Please see me back in 2-3 weeks.   --Dr. Jordan Likes

## 2022-10-26 ENCOUNTER — Ambulatory Visit: Payer: Medicaid Other | Attending: Obstetrics and Gynecology

## 2022-10-26 ENCOUNTER — Other Ambulatory Visit: Payer: Self-pay

## 2022-10-26 DIAGNOSIS — M5459 Other low back pain: Secondary | ICD-10-CM | POA: Diagnosis not present

## 2022-10-26 DIAGNOSIS — R293 Abnormal posture: Secondary | ICD-10-CM | POA: Insufficient documentation

## 2022-10-26 DIAGNOSIS — M6281 Muscle weakness (generalized): Secondary | ICD-10-CM | POA: Insufficient documentation

## 2022-10-26 DIAGNOSIS — R279 Unspecified lack of coordination: Secondary | ICD-10-CM | POA: Insufficient documentation

## 2022-10-26 DIAGNOSIS — M62838 Other muscle spasm: Secondary | ICD-10-CM | POA: Insufficient documentation

## 2022-10-26 DIAGNOSIS — R102 Pelvic and perineal pain: Secondary | ICD-10-CM | POA: Diagnosis present

## 2022-10-26 NOTE — Therapy (Addendum)
OUTPATIENT PHYSICAL THERAPY FEMALE PELVIC EVALUATION   Patient Name: Bethany Harris MRN: [id number] DOB:2001/06/26, 21 y.o., female Today's Date: 10/26/2022  END OF SESSION:  PT End of Session - 10/26/22 0857     Visit Number 1    Date for PT Re-Evaluation 01/18/23    Authorization Type [employer]    PT Start Time 0800    PT Stop Time 0845    PT Time Calculation (min) 45 min    Activity Tolerance Patient tolerated treatment well    Behavior During Therapy WFL for tasks assessed/performed             Past Medical History:  Diagnosis Date   GERD (gastroesophageal reflux disease)    History reviewed. No pertinent surgical history. Patient Active Problem List   Diagnosis Date Noted   Lumbar radiculopathy 10/18/2022   Depression, major, 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   GAD (generalized anxiety disorder) 04/13/2021   Mild depression 04/13/2021   Influenza vaccine refused 01/14/2021   Tetanus, diphtheria, and acellular pertussis (Tdap) vaccination declined 01/14/2021   Major depressive disorder, single episode, mild with anxious distress (HCC) 01/14/2021   Syncope     PCP: Kathrynn Speed, NP  REFERRING PROVIDER: Lorriane Shire, MD  REFERRING DIAG: R10.2 (ICD-10-CM) - Pelvic pain  THERAPY DIAG:  Other low back pain - Plan: PT plan of care cert/re-cert  Abnormal posture - Plan: PT plan of care cert/re-cert  Muscle weakness (generalized) - Plan: PT plan of care cert/re-cert  Other muscle spasm - Plan: PT plan of care cert/re-cert  Unspecified lack of coordination - Plan: PT plan of care cert/re-cert  Pelvic pain - Plan: PT plan of care cert/re-cert  Rationale for Evaluation and Treatment: Rehabilitation  ONSET DATE: 1.5 months ago for radiating low back pain  SUBJECTIVE:                                                                                                                                                                                            10/26/22 SUBJECTIVE STATEMENT: Sharp stabbing pain during menses, worse 2nd and 3rd day that is 10/10 pain.Will have vulvar itching with or without menses. Will have dyschezia and dysuria during menstrual cycle. Getting married in several months and would like to conceive afterwards. MD considering endometriosis; patient has declined hormonal suppression. **Internal evaluation not possible at this time.   In last 1/5 months low back pain has been worsening and going down Rt LE. Fluid intake: No - patient is not drinking much - 3 cups of water maybe; she is also not eating very much - she gets hungry but is very  full after a bite or two  PAIN:  Are you having pain? Yes NPRS scale: 10/10 Pain location: Right, Left, and low back - pain has gotten worse on right side and radiates down Rt LE ; pain will wrap around into abdomen when severe.   Pain type: aching, sharp, throbbing, and tingling Pain description: constant   Aggravating factors: any time, prolonged standing Relieving factors: lying down helps  PRECAUTIONS: Other: no internal exams at this time; pt unable to eat and drink normally and has lost weight (self-reported)  WEIGHT BEARING RESTRICTIONS: No  FALLS:  Has patient fallen in last 6 months? No  LIVING ENVIRONMENT: Lives with: lives with their family Lives in: House/apartment   OCCUPATION: not working - oldest of 7 siblings;   PLOF: Independent  PATIENT GOALS: decrease pain  PERTINENT HISTORY:  Lifelong hx of painful menses Sexual abuse: No  BOWEL MOVEMENT: Pain with bowel movement: Yes - during menstrual cycle Type of bowel movement:Frequency every day to every other day and Strain Yes Fully empty rectum: Yes: - Leakage: No Pads: No Fiber supplement: No  URINATION: Pain with urination: Yes - during menstrual cycle; pain in abdomen Fully empty bladder: Yes: - Stream:  inconsistent, hard to initiate  Urgency: Yes: - Frequency: every hour Leakage:   none Pads: No  INTERCOURSE: Pain with intercourse:  not sexually active and has never been sexually active   PREGNANCY: NA  PROLAPSE: None   OBJECTIVE:  10/26/22: DIAGNOSTIC FINDINGS:  CT abdomen and pelvis 10/3  COGNITION: Overall cognitive status: Within functional limits for tasks assessed     SENSATION: Light touch: Appears intact - normal today, but pt feels like she will have numbness/tingling into Rt foot (top and bottom) Proprioception: Appears intact  MUSCLE LENGTH:   FUNCTIONAL TESTS:  -Single leg stance: >10 sec bil, but unstable, compensated trendelenburg bil  -Squat: significant Rt valgus knee collapse -ASLR: (+) Rt with pain radiating into anterior Rt thigh and significant difficulty compared to Lt GAIT: Comments: WNL  POSTURE: rounded shoulders, forward head, increased thoracic kyphosis, and scoliosis with Rt thoracic curve/Lt lumbar curve   LUMBARAROM/PROM:  A/PROM A/PROM  Eval (% available)  Flexion 50, pain on Rt side with return to standing  Extension 25, anterior abdominal pain, central LBP  Right lateral flexion 50  Left lateral flexion 50, Rt side pain  Right rotation Pain on right, 50  Left rotation 50   (Blank rows = not tested)   LOWER EXTREMITY MMT:  MMT Right eval Left eval  Hip flexion 4, pain into anterior Rt thigh 4  Hip extension 3 4  Hip abduction 4-, pain 4-  Hip adduction 4- 4-  Hip internal rotation 4-, pain 4-  Hip external rotation 4-, pain 4-  Knee flexion    Knee extension    Ankle dorsiflexion    Ankle plantarflexion    Ankle inversion    Ankle eversion     PALPATION:   General  Tenderness over Rt SIJ, with sacral springing; tight and tender Rt lumbar paraspinals and posterolateral hip muscles; tender lower abdomen, worse on Rt; in general hypomobility of abdominal contents                External Perineal Exam tenderness around ischiopubic ramus and superior pubic ramus                              Internal Pelvic Floor NA  Patient  confirms identification and approves PT to assess internal pelvic floor and treatment No   TODAY'S TREATMENT:                                                                                                                              DATE: 10/26/22  EVAL  Neuromuscular re-education: Down training: Child's pose Cat/cow Exercises: Stretches/mobility: Open books  Lower trunk rotation     PATIENT EDUCATION:  Education details: Discussed condition and POC Person educated: Patient Education method: Explanation, Demonstration, Tactile cues, Verbal cues, and Handouts Education comprehension: verbalized understanding  HOME EXERCISE PROGRAM: [id number]  ASSESSMENT:  CLINICAL IMPRESSION: Patient is a 21 y.o. female who was seen today for physical therapy evaluation and treatment for severe low back pain with Rt LE radiculopathy and pelvic/abdominal pain. Exam findings notable for abdominal tenderness and hypomobility, pain surrounding pelvic muscle attachments, pain in abdominals and Rt gluteals/hip flexors/adductors, (+) Rt ASLR, abnormal squat with severe Rt knee valgus collapse, reduced lumbar A/ROM int oall directions with pain in Rt low back, abnormal posture with Rt thoracic/Lt lumbar curvature, and bil hip weakness with painful testing on Rt. Signs and symptoms are most consistent with localized lumbar spine dysfunction that may have been exacerbated by underlying likely endometriosis. Initial treatment consisted of down training activities and gentle stretches in order to get patient moving; we discussed making sure she is attempting these exercises when she is having pain to see if they don't help decrease symptoms. Painful posturing and avoidance behaviors are likely increasing pain. She will benefit from skilled PT intervention in order to decrease pain, address impairments, and increase QOL.   OBJECTIVE IMPAIRMENTS: decreased activity tolerance,  decreased coordination, decreased endurance, decreased strength, hypomobility, increased fascial restrictions, increased muscle spasms, impaired tone, postural dysfunction, and pain.   ACTIVITY LIMITATIONS: bending, standing, squatting, and locomotion level  PARTICIPATION LIMITATIONS: community activity and home activity   REHAB POTENTIAL: Good  CLINICAL DECISION MAKING: Stable/uncomplicated  EVALUATION COMPLEXITY: Low   GOALS: Goals reviewed with patient? Yes  SHORT TERM GOALS: Target date: 11/23/22  Pt will be independent with HEP.   Baseline: Goal status: INITIAL  2.  Pt will be independent with diaphragmatic breathing and down training activities in order to improve pelvic floor relaxation.  Baseline:  Goal status: INITIAL  3.  Pt will be able to teach back and utilize urge suppression technique in order to help reduce number of trips to the bathroom.    Baseline:  Goal status: INITIAL    LONG TERM GOALS: Target date: 12/29/21  Pt will be independent with advanced HEP.   Baseline:  Goal status: INITIAL  2.  Pt will increase all impaired lumbar A/ROM by 25% without pain.  Baseline:  Goal status: INITIAL  3.  Pt will demonstrate normal squatting posture and Rt ASLR in order to show improved functional Rt hip strength. Baseline:  Goal status: INITIAL  4.  Pt will demonstrate increase in all  impaired hip strength by 1 muscle grades in order to demonstrate improved lumbopelvic support and increase functional ability.   Baseline:  Goal status: INITIAL  5.  Pt will report no Rt low back/Rt LE radicular symptoms greater than 4/10 with any activity in order to improve functional ability.  Baseline:  Goal status: INITIAL    PLAN:  PT FREQUENCY: 1x/week  PT DURATION: 12 weeks  PLANNED INTERVENTIONS: Therapeutic exercises, Therapeutic activity, Neuromuscular re-education, Balance training, Gait training, Patient/Family education, Self Care, Joint  mobilization, Dry Needling, Biofeedback, and Manual therapy  PLAN FOR NEXT SESSION: Progress down training and mobility activities; manual techniques to Rt posterolateral hip mm, low back, and abdomen.    Julio Alm, PT, DPT11/30/2312:26 PM

## 2022-10-30 ENCOUNTER — Ambulatory Visit: Payer: Medicaid Other

## 2022-11-01 ENCOUNTER — Ambulatory Visit (INDEPENDENT_AMBULATORY_CARE_PROVIDER_SITE_OTHER): Payer: Medicaid Other | Admitting: Family Medicine

## 2022-11-01 ENCOUNTER — Encounter: Payer: Self-pay | Admitting: Family Medicine

## 2022-11-01 VITALS — BP 108/68 | Ht 64.0 in | Wt 120.0 lb

## 2022-11-01 DIAGNOSIS — M5416 Radiculopathy, lumbar region: Secondary | ICD-10-CM | POA: Diagnosis present

## 2022-11-01 DIAGNOSIS — N946 Dysmenorrhea, unspecified: Secondary | ICD-10-CM | POA: Diagnosis not present

## 2022-11-01 MED ORDER — GABAPENTIN 100 MG PO CAPS: 100.0000 mg | ORAL_CAPSULE | Freq: Three times a day (TID) | ORAL | 1 refills | Status: DC

## 2022-11-01 MED ORDER — IBUPROFEN 600 MG PO TABS: 600.0000 mg | ORAL_TABLET | Freq: Four times a day (QID) | ORAL | 3 refills | Status: DC | PRN

## 2022-11-01 NOTE — Patient Instructions (Signed)
Good to see you Please try heat  Please place a hold on physical therapy  Please try gently stretching  Please start with one pill of gabapentin at night. You can increase this to 2 or 3 times daily  Please use the ibuprofen as needed   Please send me a message in MyChart with any questions or updates.  Please see me back in 4 weeks.   --Dr. Jordan Likes

## 2022-11-01 NOTE — Assessment & Plan Note (Signed)
Acutely still painful.  Pain still resembles a radicular pattern.  Previous CT scan of the abdomen and pelvis showed no significant changes of the lumbar spine or the pelvis or the right hip. -Counseled on home exercise therapy and supportive care. -Gabapentin. -Ibuprofen. -Please hold on physical therapy. -Could consider further imaging.

## 2022-11-01 NOTE — Progress Notes (Signed)
  Nohea Kras - 21 y.o. female MRN [id number]  Date of birth: 2001/02/20  SUBJECTIVE:  Including CC & ROS.  No chief complaint on file.   Tashyra Adduci is a 21 y.o. female that is presenting with ongoing right radicular sided pain.  Did not tolerate physical therapy.  The pain is still severe in nature.  Interview was conducted with an in person interpreter.  Review of Systems See HPI   HISTORY: Past Medical, Surgical, Social, and Family History Reviewed & Updated per EMR.   Pertinent Historical Findings include:  Past Medical History:  Diagnosis Date   GERD (gastroesophageal reflux disease)     History reviewed. No pertinent surgical history.   PHYSICAL EXAM:  VS: BP 108/68   Ht 5\' 4"  (1.626 m)   Wt 120 lb (54.4 kg)   BMI 20.60 kg/m  Physical Exam Gen: NAD, alert, cooperative with exam, well-appearing MSK:  Neurovascularly intact       ASSESSMENT & PLAN:   Lumbar radiculopathy Acutely still painful.  Pain still resembles a radicular pattern.  Previous CT scan of the abdomen and pelvis showed no significant changes of the lumbar spine or the pelvis or the right hip. -Counseled on home exercise therapy and supportive care. -Gabapentin. -Ibuprofen. -Please hold on physical therapy. -Could consider further imaging.

## 2022-11-03 ENCOUNTER — Emergency Department (HOSPITAL_COMMUNITY)
Admission: EM | Admit: 2022-11-03 | Discharge: 2022-11-03 | Disposition: A | Discharge status: Home / Self Care | Payer: Medicaid Other | Attending: Emergency Medicine | Admitting: Emergency Medicine

## 2022-11-03 ENCOUNTER — Other Ambulatory Visit: Payer: Self-pay

## 2022-11-03 ENCOUNTER — Emergency Department (HOSPITAL_COMMUNITY): Payer: Medicaid Other

## 2022-11-03 ENCOUNTER — Encounter (HOSPITAL_COMMUNITY): Payer: Self-pay

## 2022-11-03 DIAGNOSIS — R1084 Generalized abdominal pain: Secondary | ICD-10-CM | POA: Diagnosis not present

## 2022-11-03 DIAGNOSIS — R55 Syncope and collapse: Secondary | ICD-10-CM | POA: Diagnosis present

## 2022-11-03 LAB — URINALYSIS, ROUTINE W REFLEX MICROSCOPIC
Glucose, UA: NEGATIVE mg/dL
Ketones, ur: NEGATIVE mg/dL
Nitrite: NEGATIVE
Protein, ur: 30 mg/dL — AB
Specific Gravity, Urine: 1.02 (ref 1.005–1.030)
pH: 8 (ref 5.0–8.0)

## 2022-11-03 LAB — CBC WITH DIFFERENTIAL/PLATELET
Abs Immature Granulocytes: 0.02 10*3/uL (ref 0.00–0.07)
Basophils Absolute: 0.1 10*3/uL (ref 0.0–0.1)
Basophils Relative: 1 %
Eosinophils Absolute: 0.2 10*3/uL (ref 0.0–0.5)
Eosinophils Relative: 2 %
HCT: 40.3 % (ref 36.0–46.0)
Hemoglobin: 13.5 g/dL (ref 12.0–15.0)
Immature Granulocytes: 0 %
Lymphocytes Relative: 23 %
Lymphs Abs: 2.2 10*3/uL (ref 0.7–4.0)
MCH: 28.7 pg (ref 26.0–34.0)
MCHC: 33.5 g/dL (ref 30.0–36.0)
MCV: 85.6 fL (ref 80.0–100.0)
Monocytes Absolute: 0.7 10*3/uL (ref 0.1–1.0)
Monocytes Relative: 8 %
Neutro Abs: 6.2 10*3/uL (ref 1.7–7.7)
Neutrophils Relative %: 66 %
Platelets: 198 10*3/uL (ref 150–400)
RBC: 4.71 MIL/uL (ref 3.87–5.11)
RDW: 11.8 % (ref 11.5–15.5)
WBC: 9.4 10*3/uL (ref 4.0–10.5)
nRBC: 0 % (ref 0.0–0.2)

## 2022-11-03 LAB — I-STAT BETA HCG BLOOD, ED (MC, WL, AP ONLY): I-stat hCG, quantitative: <5 m[IU]/mL (ref <5)

## 2022-11-03 LAB — COMPREHENSIVE METABOLIC PANEL
ALT: 12 U/L (ref 0–44)
AST: 18 U/L (ref 15–41)
Albumin: 4.1 g/dL (ref 3.5–5.0)
Alkaline Phosphatase: 67 U/L (ref 38–126)
Anion gap: 9 (ref 5–15)
BUN: 12 mg/dL (ref 6–20)
CO2: 22 mmol/L (ref 22–32)
Calcium: 9.5 mg/dL (ref 8.9–10.3)
Chloride: 110 mmol/L (ref 98–111)
Creatinine, Ser: 0.37 mg/dL — ABNORMAL LOW (ref 0.44–1.00)
GFR, Estimated: >60 mL/min (ref >60)
Glucose, Bld: 84 mg/dL (ref 70–99)
Potassium: 3.8 mmol/L (ref 3.5–5.1)
Sodium: 141 mmol/L (ref 135–145)
Total Bilirubin: 0.6 mg/dL (ref 0.3–1.2)
Total Protein: 7.5 g/dL (ref 6.5–8.1)

## 2022-11-03 LAB — CBG MONITORING, ED: Glucose-Capillary: 95 mg/dL (ref 70–99)

## 2022-11-03 LAB — URINALYSIS, MICROSCOPIC (REFLEX): RBC / HPF: >50 RBC/hpf (ref 0–5)

## 2022-11-03 LAB — D-DIMER, QUANTITATIVE: D-Dimer, Quant: 0.5 ug/mL-FEU (ref 0.00–0.50)

## 2022-11-03 MED ORDER — ONDANSETRON HCL 4 MG/2ML IJ SOLN
4.0000 mg | Freq: Once | INTRAMUSCULAR | Status: AC
  Administered 2022-11-03: 4 mg via INTRAVENOUS
  Filled 2022-11-03: qty 2

## 2022-11-03 MED ORDER — SODIUM CHLORIDE 0.9 % IV BOLUS
1000.0000 mL | Freq: Once | INTRAVENOUS | Status: AC
  Administered 2022-11-03: 1000 mL via INTRAVENOUS

## 2022-11-03 MED ORDER — ONDANSETRON HCL 4 MG PO TABS: 4.0000 mg | ORAL_TABLET | Freq: Three times a day (TID) | ORAL | 0 refills | Status: DC | PRN

## 2022-11-03 MED ORDER — KETOROLAC TROMETHAMINE 30 MG/ML IJ SOLN
30.0000 mg | Freq: Once | INTRAMUSCULAR | Status: AC
  Administered 2022-11-03: 30 mg via INTRAVENOUS
  Filled 2022-11-03: qty 1

## 2022-11-03 NOTE — ED Provider Notes (Signed)
[facility]-EMERGENCY DEPT Provider Note   CSN: [id number] Arrival date & time: 11/03/22  1412     History  Chief Complaint  Patient presents with   Loss of Consciousness   Emesis    Bethany Harris is a 21 y.o. female presents to the ED after a syncopal episode at home.  Patient states she started her period yesterday, has had severe abdominal pain that radiates into her back, which caused her to feel nauseated, vomit and pass out and hit her head on the floor.  Patient has history significant for dysmenorrhea, prior syncopal episodes around her menstrual cycle, and is followed by OB/GYN.  Patient states her period was approximately a week late, denies ever having sexual intercourse.  She uses pads and goes through about 5-6 throughout the day.  She states her bleeding is slightly heavier than normal.  She reports mild dysuria.  Denies fever, chills, vaginal discharge, weakness, tremors, headache, diarrhea, chest pain, shortness of breath, lightheadedness, dizziness, flank pain, difficulty urinating.    The history was provided by the patient.  No interpreter was required.     Home Medications Prior to Admission medications   Medication Sig Start Date End Date Taking? Authorizing Provider  cyclobenzaprine (FLEXERIL) 10 MG tablet Take 0.5-1 tablets (5-10 mg total) by mouth 2 (two) times daily as needed for muscle spasms. Patient not taking: Reported on 10/26/2022 09/20/22   Arthor Captain, PA-C  dicyclomine (BENTYL) 10 MG capsule Take 1 capsule (10 mg total) by mouth 4 (four) times daily -  before meals and at bedtime. Patient not taking: Reported on 09/05/2022 06/01/22   Bernerd Limbo, CNM  fluconazole (DIFLUCAN) 150 MG tablet Take 1 tablet (150 mg total) by mouth daily. Patient not taking: Reported on 10/26/2022 09/28/22   Ivonne Andrew, NP  gabapentin (NEURONTIN) 100 MG capsule Take 1 capsule (100 mg total) by mouth 3 (three) times daily. 11/01/22   Myra Rude, MD  HYDROcodone-acetaminophen (NORCO/VICODIN) 5-325 MG tablet Take 1 tablet by mouth every 8 (eight) hours as needed. Patient not taking: Reported on 10/26/2022 10/18/22   Myra Rude, MD  ibuprofen (ADVIL) 600 MG tablet Take 1 tablet (600 mg total) by mouth every 6 (six) hours as needed. 11/01/22   Myra Rude, MD  ketoconazole (NIZORAL) 2 % shampoo apply two times per week, massage into scalp and leave in for 10 minutes before rinsing out Patient not taking: Reported on 09/05/2022 05/08/22   Willeen Niece, MD  mometasone (ELOCON) 0.1 % cream Apply to affected itchy skin qd-bid prn, Avoid applying to face, groin, and axilla. Use as directed. Long-term use can cause thinning of the skin. Patient not taking: Reported on 09/05/2022 05/08/22   Willeen Niece, MD  ondansetron (ZOFRAN) 4 MG tablet Take 1 tablet (4 mg total) by mouth every 8 (eight) hours as needed for nausea or vomiting (DURING MENSEES). 11/03/22   Melton Alar R, PA      Allergies    Patient has no known allergies.    Review of Systems   Review of Systems  Constitutional:  Negative for chills and fever.  Respiratory:  Negative for shortness of breath.   Cardiovascular:  Negative for chest pain.  Gastrointestinal:  Positive for abdominal pain, nausea and vomiting. Negative for diarrhea.  Genitourinary:  Positive for dysuria and vaginal bleeding. Negative for difficulty urinating, flank pain, frequency and vaginal discharge.  Musculoskeletal:  Positive for back pain.  Neurological:  Positive for syncope.  Negative for dizziness, tremors, weakness, light-headedness and numbness.    Physical Exam Updated Vital Signs BP 104/75   Pulse 90   Temp 98.3 F (36.8 C) (Oral)   Resp 20   Ht 5\' 4"  (1.626 m)   Wt 54.5 kg   LMP 11/02/2022   SpO2 100%   BMI 20.61 kg/m  Physical Exam Vitals and nursing note reviewed.  Constitutional:      General: She is not in acute distress.    Appearance: She is not  ill-appearing.  HENT:     Head: Normocephalic and atraumatic.     Mouth/Throat:     Mouth: Mucous membranes are moist.     Pharynx: Oropharynx is clear.  Cardiovascular:     Rate and Rhythm: Normal rate and regular rhythm.     Pulses: Normal pulses.     Heart sounds: Normal heart sounds.  Pulmonary:     Effort: Pulmonary effort is normal. No respiratory distress.     Breath sounds: Normal breath sounds and air entry.  Abdominal:     General: Abdomen is flat. Bowel sounds are normal. There is no distension.     Palpations: Abdomen is soft.     Tenderness: There is generalized abdominal tenderness.     Comments: Generalized abdominal pain with light palpation; no localization  Musculoskeletal:     Cervical back: Full passive range of motion without pain. No spinous process tenderness or muscular tenderness.  Skin:    General: Skin is warm and dry.     Capillary Refill: Capillary refill takes less than 2 seconds.  Neurological:     Mental Status: She is alert. Mental status is at baseline.  Psychiatric:        Mood and Affect: Mood normal.        Behavior: Behavior normal.     ED Results / Procedures / Treatments   Labs (all labs ordered are listed, but only abnormal results are displayed) Labs Reviewed  COMPREHENSIVE METABOLIC PANEL - Abnormal; Notable for the following components:      Result Value   Creatinine, Ser 0.37 (*)    All other components within normal limits  URINALYSIS, ROUTINE W REFLEX MICROSCOPIC - Abnormal; Notable for the following components:   Color, Urine RED (*)    APPearance CLOUDY (*)    Hgb urine dipstick LARGE (*)    Bilirubin Urine SMALL (*)    Protein, ur 30 (*)    Leukocytes,Ua SMALL (*)    All other components within normal limits  URINALYSIS, MICROSCOPIC (REFLEX) - Abnormal; Notable for the following components:   Bacteria, UA RARE (*)    All other components within normal limits  CBC WITH DIFFERENTIAL/PLATELET  D-DIMER, QUANTITATIVE   I-STAT BETA HCG BLOOD, ED ([facility], [facility], [facility] ONLY)  CBG MONITORING, ED    EKG EKG Interpretation  Date/Time:  Friday November 03 2022 14:27:34 EST Ventricular Rate:  102 PR Interval:  101 QRS Duration: 85 QT Interval:  338 QTC Calculation: 441 R Axis:   80 Text Interpretation: Sinus tachycardia RSR' in V1 or V2, right VCD or RVH when cmpared to prior, similar appearnce. No STEMI Confirmed by [id number] (Theda Belfast) on 11/03/2022 4:28:36 PM  Radiology DG Chest 2 View  Result Date: 11/03/2022 CLINICAL DATA:  Syncope EXAM: CHEST - 2 VIEW COMPARISON:  None Available. FINDINGS: The heart size and mediastinal contours are within normal limits. Both lungs are clear. The visualized skeletal structures are unremarkable. IMPRESSION: No active cardiopulmonary disease. Electronically  Signed   By: Ernie AvenaPalani  Rathinasamy M.D.   On: 11/03/2022 15:40    Procedures Procedures    Medications Ordered in ED Medications  sodium chloride 0.9 % bolus 1,000 mL (1,000 mLs Intravenous New Bag/Given 11/03/22 1631)  ondansetron (ZOFRAN) injection 4 mg (4 mg Intravenous Given 11/03/22 1631)  ketorolac (TORADOL) 30 MG/ML injection 30 mg (30 mg Intravenous Given 11/03/22 1631)    ED Course/ Medical Decision Making/ A&P                           Medical Decision Making Risk Prescription drug management.   This patient presents to the ED with chief complaint(s) of syncope, abdominal pain with nausea/vomiting associated with menstrual cycle with pertinent past medical history of primary dysmenorrhea .The complaint involves an extensive differential diagnosis and also carries with it a high risk of complications and morbidity.    The differential diagnosis includes ectopic pregnancy, ovarian torsion, endometriosis, dysmenorrhea, Mittelschmerz, pelvic congestion syndrome, acute cystitis, pyelonephritis   The initial plan is to obtain baseline labs including CBC, CMP, beta hCG, and UA  Additional history  obtained: Records reviewed  documentation from OB/GYN visit 10/23  Initial Assessment:   On exam, patient is resting comfortably in bed and does not appear to be in acute distress.  Skin is warm and dry.  Bowel sounds are normal.  Abdomen is generally tender to light palpation, no guarding.  No CVA tenderness.  Lungs clear to auscultation, heart rate and rhythm normal.  She is normocephalic, atraumatic.  No cervical spine tenderness, normal passive range of motion without pain.  Unable to fully assess patient's head due to wearing a scarf for religious reasons.  Independent ECG/labs interpretation:  The following labs were independently interpreted:  CBC without evidence of leukocytosis or anemia.  Platelet count normal.  Metabolic panel significant for decreased creatinine at 0.37, no evidence of electrolyte disturbance.  LFTs are normal. UA significant for red cloudy appearance with small amount of leukocytes and rare bacteria. D-dimer normal at 0.50. Pregnancy test was negative.  Independent visualization and interpretation of imaging: I independently visualized the following imaging with scope of interpretation limited to determining acute life threatening conditions related to emergency care: CXR, which revealed no evidence of pleural effusion, pneumothorax, or infiltrate.  Heart size is normal.  Other treatment options considered: Discussed with patient possible need for further imaging such as ultrasound, patient refuses to have a transvaginal ultrasound and does not wish to have any test or imaging that requires insertion into the vagina.  Patient has had multiple CT abdomen pelvic when seen previously for same symptoms without acute findings.  Patient states she usually improves with IV fluids and medicine and that she would like to try that.   Treatment and Reassessment: Treated patient with IV fluids, Zofran, and Toradol while in the ED.  Patient symptoms improved following treatment  and she states she would like to go home now.   Disposition:   After consideration of the diagnostic results and the patients response to treatment, I feel that emergency department workup does not suggest an emergent condition requiring admission or immediate intervention beyond what has been performed at this time.  The patient is safe for discharge and has been instructed to return immediately for worsening symptoms, change in symptoms or any other concerns.  Will send patient home with prescription for Zofran to manage nausea.  Recommended patient follow up with her OB/GYN if she  continues to have symptoms.   Discussed HPI, physical exam findings, assessment and plan with attending Theda Belfast who agrees with current plan.          Final Clinical Impression(s) / ED Diagnoses Final diagnoses:  Generalized abdominal pain  Syncope, unspecified syncope type    Rx / DC Orders ED Discharge Orders          Ordered    ondansetron (ZOFRAN) 4 MG tablet  Every 8 hours PRN        11/03/22 1815              Lenard Simmer, [place] 11/03/22 1820    Tegeler, Canary Brim, MD 11/03/22 2316

## 2022-11-03 NOTE — ED Triage Notes (Signed)
Patient reports that she has painful periods and started her period yesterday. Patient aslo c/o back pain.  Today, the patient states she began vomiting and passed out in the bathroom. Patient states she hit her head on the floor.

## 2022-11-03 NOTE — ED Provider Triage Note (Signed)
Emergency Medicine Provider Triage Evaluation Note  Scout Guyett , a 21 y.o. female  was evaluated in triage.  Pt complains of syncope. She reports her menstrual cycle was late this month, she got it this week and began to feel bad walked over to the bathroom felt her abdomen "have intense pain like a 10" and began to have multiple episodes of emesis. She then proceed to pass out with LOC until she woke up with EMS at [facility] long ED.   Review of Systems  Positive: Syncope, lower abdominal pain, vaginal bleeding Negative: Chest pain, shortness of breath  Physical Exam  BP 117/68 (BP Location: Right Arm)   Pulse 96   Temp 98.3 F (36.8 C) (Oral)   Resp 14   Ht 5\' 4"  (1.626 m)   Wt 54.5 kg   SpO2 100%   BMI 20.61 kg/m  Gen:   Awake, no distress   Resp:  Normal effort  MSK:   Moves extremities without difficulty  Other:    Medical Decision Making  Medically screening exam initiated at 2:40 PM.  Appropriate orders placed.  Bleu Golab was informed that the remainder of the evaluation will be completed by another provider, this initial triage assessment does not replace that evaluation, and the importance of remaining in the ED until their evaluation is complete.  Patient here with severe lower abdominal pain, on her cycle surrounding back pain with syncope and + LOC. PE, versus torsion versus syncope   , PA-C 11/03/22 1444

## 2022-11-03 NOTE — Discharge Instructions (Signed)
Thank you for allowing me to be a part of your care today.  Please schedule a follow-up appointment with your OB/GYN.  I have sent a prescription for nausea medicine to your pharmacy.   Return to the ER if you develop new or worsening symptoms or have any new concerns.

## 2022-11-06 ENCOUNTER — Telehealth: Payer: Self-pay

## 2022-11-06 NOTE — Telephone Encounter (Signed)
Transition Care Management Follow-up Telephone Call Date of discharge and from where: 11/03/22 How have you been since you were released from the hospital? Per pt she has no improvement  Any questions or concerns? No  Items Reviewed: Did the pt receive and understand the discharge instructions provided? Yes  Medications obtained and verified? Yes  Other? No  Any new allergies since your discharge? No  Dietary orders reviewed? No Do you have support at home? Yes    Follow up appointments reviewed:  PCP Hospital f/u appt confirmed? Yes  Scheduled to see tonya  on 11/13/22 @ 11:20. Specialist Hospital f/u appt confirmed? No   Are transportation arrangements needed? No  If their condition worsens, is the pt aware to call PCP or go to the Emergency Dept.? Yes Was the patient provided with contact information for the PCP's office or ED? Yes Was to pt encouraged to call back with questions or concerns? Yes    Renelda Loma RMA

## 2022-11-08 ENCOUNTER — Ambulatory Visit: Payer: Medicaid Other

## 2022-11-13 ENCOUNTER — Inpatient Hospital Stay: Payer: Self-pay | Admitting: Nurse Practitioner

## 2022-12-03 ENCOUNTER — Emergency Department (HOSPITAL_COMMUNITY)
Admission: EM | Admit: 2022-12-03 | Discharge: 2022-12-04 | Disposition: A | Discharge status: Home / Self Care | Payer: Medicaid Other | Attending: Emergency Medicine | Admitting: Emergency Medicine

## 2022-12-03 ENCOUNTER — Encounter (HOSPITAL_COMMUNITY): Payer: Self-pay

## 2022-12-03 DIAGNOSIS — R1084 Generalized abdominal pain: Secondary | ICD-10-CM | POA: Diagnosis present

## 2022-12-03 DIAGNOSIS — N946 Dysmenorrhea, unspecified: Secondary | ICD-10-CM | POA: Insufficient documentation

## 2022-12-03 LAB — CBC WITH DIFFERENTIAL/PLATELET
Abs Immature Granulocytes: 0.02 10*3/uL (ref 0.00–0.07)
Basophils Absolute: 0.1 10*3/uL (ref 0.0–0.1)
Basophils Relative: 1 %
Eosinophils Absolute: 0.1 10*3/uL (ref 0.0–0.5)
Eosinophils Relative: 1 %
HCT: 38 % (ref 36.0–46.0)
Hemoglobin: 12.9 g/dL (ref 12.0–15.0)
Immature Granulocytes: 0 %
Lymphocytes Relative: 31 %
Lymphs Abs: 2.8 10*3/uL (ref 0.7–4.0)
MCH: 28.7 pg (ref 26.0–34.0)
MCHC: 33.9 g/dL (ref 30.0–36.0)
MCV: 84.4 fL (ref 80.0–100.0)
Monocytes Absolute: 0.6 10*3/uL (ref 0.1–1.0)
Monocytes Relative: 7 %
Neutro Abs: 5.5 10*3/uL (ref 1.7–7.7)
Neutrophils Relative %: 60 %
Platelets: 175 10*3/uL (ref 150–400)
RBC: 4.5 MIL/uL (ref 3.87–5.11)
RDW: 11.9 % (ref 11.5–15.5)
WBC: 9.1 10*3/uL (ref 4.0–10.5)
nRBC: 0 % (ref 0.0–0.2)

## 2022-12-03 LAB — PREGNANCY, URINE: Preg Test, Ur: NEGATIVE

## 2022-12-03 LAB — COMPREHENSIVE METABOLIC PANEL
ALT: 13 U/L (ref 0–44)
AST: 18 U/L (ref 15–41)
Albumin: 3.9 g/dL (ref 3.5–5.0)
Alkaline Phosphatase: 68 U/L (ref 38–126)
Anion gap: 6 (ref 5–15)
BUN: 12 mg/dL (ref 6–20)
CO2: 21 mmol/L — ABNORMAL LOW (ref 22–32)
Calcium: 9 mg/dL (ref 8.9–10.3)
Chloride: 110 mmol/L (ref 98–111)
Creatinine, Ser: 0.69 mg/dL (ref 0.44–1.00)
GFR, Estimated: >60 mL/min (ref >60)
Glucose, Bld: 96 mg/dL (ref 70–99)
Potassium: 3.8 mmol/L (ref 3.5–5.1)
Sodium: 137 mmol/L (ref 135–145)
Total Bilirubin: 0.5 mg/dL (ref 0.3–1.2)
Total Protein: 6.9 g/dL (ref 6.5–8.1)

## 2022-12-03 MED ORDER — ONDANSETRON 4 MG PO TBDP
4.0000 mg | ORAL_TABLET | Freq: Once | ORAL | Status: AC
  Administered 2022-12-04: 4 mg via ORAL
  Filled 2022-12-03: qty 1

## 2022-12-03 MED ORDER — KETOROLAC TROMETHAMINE 15 MG/ML IJ SOLN
15.0000 mg | Freq: Once | INTRAMUSCULAR | Status: AC
  Administered 2022-12-04: 15 mg via INTRAVENOUS
  Filled 2022-12-03: qty 1

## 2022-12-03 MED ORDER — SODIUM CHLORIDE 0.9 % IV BOLUS
1000.0000 mL | Freq: Once | INTRAVENOUS | Status: AC
  Administered 2022-12-04: 1000 mL via INTRAVENOUS

## 2022-12-03 NOTE — ED Triage Notes (Signed)
Pt presents with c/o abdominal pain and back pain that started today. Pt reports she started her period this evening but reports the pain was very intense before her period even started today.

## 2022-12-03 NOTE — ED Notes (Signed)
Pt eating fish filet from [employer] and drinking mountain dew.

## 2022-12-03 NOTE — ED Provider Triage Note (Signed)
Emergency Medicine Provider Triage Evaluation Note  Bethany Harris , a 22 y.o. female  was evaluated in triage.  Pt complains of onset of generalized abdominal pain early this morning. Describes pain as like a "stabbing" pain all over her abdomen with some pain in bilateral thighs as well. Pt had onset of her menstrual cycle this afternoon. Reports she usually has pain with her periods but it usually is not until day 2 or more of her cycle and she never has pain preceding her cycle. Describes bleeding as very minimal, mostly only with wiping. Denies fever, chills, chest pain, shortness of breath, or diarrhea. + dysuria, lower back pain, and one episode of vomiting. No history of menorrhagia or anemia. Denies pregnancy. Had ibuprofen for her symptoms at around 4pm this afternoon. No history of UTIs.   Review of Systems  Positive: See HPI Negative: See HPI  Physical Exam  BP 109/69 (BP Location: Left Arm)   Pulse 88   Temp 98.2 F (36.8 C) (Oral)   Resp 18   Ht 5\' 4"  (1.626 m)   Wt 54.4 kg   LMP 12/03/2022 (Approximate)   SpO2 100%   BMI 20.60 kg/m  Gen:   Awake, no distress   Resp:  Normal effort  MSK:   Moves extremities without difficulty  Other:  Moderate lower abdominal tenderness, mild upper abdominal tenderness, bilateral CVA tenderness  Medical Decision Making  Medically screening exam initiated at 7:39 PM.  Appropriate orders placed.  Bethany Harris was informed that the remainder of the evaluation will be completed by another provider, this initial triage assessment does not replace that evaluation, and the importance of remaining in the ED until their evaluation is complete.     Bethany Righter, PA-C 12/03/22 1942

## 2022-12-03 NOTE — ED Provider Notes (Signed)
[facility]-EMERGENCY DEPT Provider Note   CSN: [id number] Arrival date & time: 12/03/22  6295     History  Chief Complaint  Patient presents with   Abdominal Pain    Bethany Harris is a 22 y.o. female who presents emergency department with concerns for generalized abdominal pain onset this morning.  Notes that she typically has abdominal pain prior to the onset of her menstrual cycle.  Notes that she started her menstrual cycle today.  She has been evaluated before by her OB/GYN specialist for similar concerns in the past.  Denies concern for pregnancy or STDs at this time.  Has tried ibuprofen with her last dose of 4 PM.  Denies vomiting, dysuria, vaginal discharge.  The history is provided by the patient. No language interpreter was used.  Abdominal Pain      Home Medications Prior to Admission medications   Medication Sig Start Date End Date Taking? Authorizing Provider  cyclobenzaprine (FLEXERIL) 10 MG tablet Take 0.5-1 tablets (5-10 mg total) by mouth 2 (two) times daily as needed for muscle spasms. Patient not taking: Reported on 10/26/2022 09/20/22   Arthor Captain, PA-C  dicyclomine (BENTYL) 10 MG capsule Take 1 capsule (10 mg total) by mouth 4 (four) times daily -  before meals and at bedtime. Patient not taking: Reported on 09/05/2022 06/01/22   Bernerd Limbo, CNM  fluconazole (DIFLUCAN) 150 MG tablet Take 1 tablet (150 mg total) by mouth daily. Patient not taking: Reported on 10/26/2022 09/28/22   Ivonne Andrew, NP  gabapentin (NEURONTIN) 100 MG capsule Take 1 capsule (100 mg total) by mouth 3 (three) times daily. 11/01/22   Myra Rude, MD  HYDROcodone-acetaminophen (NORCO/VICODIN) 5-325 MG tablet Take 1 tablet by mouth every 8 (eight) hours as needed. Patient not taking: Reported on 10/26/2022 10/18/22   Myra Rude, MD  ibuprofen (ADVIL) 600 MG tablet Take 1 tablet (600 mg total) by mouth every 6 (six) hours as needed. 11/01/22    Myra Rude, MD  ketoconazole (NIZORAL) 2 % shampoo apply two times per week, massage into scalp and leave in for 10 minutes before rinsing out Patient not taking: Reported on 09/05/2022 05/08/22   Willeen Niece, MD  mometasone (ELOCON) 0.1 % cream Apply to affected itchy skin qd-bid prn, Avoid applying to face, groin, and axilla. Use as directed. Long-term use can cause thinning of the skin. Patient not taking: Reported on 09/05/2022 05/08/22   Willeen Niece, MD  ondansetron (ZOFRAN) 4 MG tablet Take 1 tablet (4 mg total) by mouth every 8 (eight) hours as needed for nausea or vomiting (DURING MENSEES). 11/03/22   Melton Alar R, PA      Allergies    Patient has no known allergies.    Review of Systems   Review of Systems  Gastrointestinal:  Positive for abdominal pain.    Physical Exam Updated Vital Signs BP 109/69 (BP Location: Left Arm)   Pulse 88   Temp 98.2 F (36.8 C) (Oral)   Resp 18   Ht 5\' 4"  (1.626 m)   Wt 54.4 kg   LMP 12/03/2022 (Approximate)   SpO2 100%   BMI 20.60 kg/m  Physical Exam Vitals and nursing note reviewed.  Constitutional:      General: She is not in acute distress.    Appearance: She is not diaphoretic.  HENT:     Head: Normocephalic and atraumatic.     Mouth/Throat:     Pharynx: No oropharyngeal exudate.  Eyes:     General: No scleral icterus.    Conjunctiva/sclera: Conjunctivae normal.  Cardiovascular:     Rate and Rhythm: Normal rate and regular rhythm.     Pulses: Normal pulses.     Heart sounds: Normal heart sounds.  Pulmonary:     Effort: Pulmonary effort is normal. No respiratory distress.     Breath sounds: Normal breath sounds. No wheezing.  Abdominal:     General: Bowel sounds are normal.     Palpations: Abdomen is soft. There is no mass.     Tenderness: There is no abdominal tenderness. There is no guarding or rebound.     Comments: Diffuse abdominal tenderness to palpation.  Musculoskeletal:        General: Normal range  of motion.     Cervical back: Normal range of motion and neck supple.  Skin:    General: Skin is warm and dry.  Neurological:     Mental Status: She is alert.  Psychiatric:        Behavior: Behavior normal.     ED Results / Procedures / Treatments   Labs (all labs ordered are listed, but only abnormal results are displayed) Labs Reviewed  COMPREHENSIVE METABOLIC PANEL - Abnormal; Notable for the following components:      Result Value   CO2 21 (*)    All other components within normal limits  CBC WITH DIFFERENTIAL/PLATELET  URINALYSIS, ROUTINE W REFLEX MICROSCOPIC  PREGNANCY, URINE    EKG None  Radiology No results found.  Procedures Procedures    Medications Ordered in ED Medications  ondansetron (ZOFRAN-ODT) disintegrating tablet 4 mg (has no administration in time range)    ED Course/ Medical Decision Making/ A&P                           Medical Decision Making  Patient presents to the emergency department with *** x *** days.  Patient has a history of ***{gastroparesis or GI related}. {Any previous ED visits}. Pt has GI follow up with {GI group and when.}. On exam patient with {diffuse abdominal TTP. No focal abdominal pain.} Remainder of exam without acute findings.  Pt afebrile. *** Differential diagnosis includes pancreatitis, cholecystitis, appendicitis, UTI, GERD, gastroparesis exacerbation, cannabinoid hyperemesis syndrome. ***   Additional history obtained:  Additional history obtained from {sabhistory:27144} External records from outside source obtained and reviewed including: ***  Labs:  I ordered, and personally interpreted labs.  The pertinent results include:  {sablabs:28098}  Imaging: I ordered imaging studies including {sabimaging:28099} I independently visualized and interpreted imaging which showed *** I agree with the radiologist interpretation  Medications:  I ordered medication including {sabmeds:28100} for ***. {GI  cocktail} Reevaluation of the patient after these medicines and interventions, I reevaluated the patient and found that they have {resolved/improved/worsened:23923::"improved"} I have reviewed the patients home medicines and have made adjustments as needed Tolerated PO challenge in ED with above treatment regimen ***  {Cardiac Monitoring: The patient was maintained on a cardiac monitor.  I personally viewed and interpreted the cardiac monitored which showed an underlying rhythm of: ***.   Test Considered: ***   Critical Interventions ***}   {Consultations: I requested consultation with the {sabspecialists:27145}, and discussed lab and imaging findings as well as pertinent plan - they recommend: ***}   Disposition: {End of MDM here with the likely diagnosis}. After consideration of the diagnostic results and the patients response to treatment, I feel that the patient would  benefit from {sabdispo:27146}. {Discharge home with prescription Zofran/phenergan.} {Will provide information for on-call Gastroenterologist. Instructed pt to call and set up follow up appointment regarding todays ED visit with the gastroenterologist.} Supportive care measures and strict return precautions discussed with patient at bedside. Pt acknowledges and verbalizes understanding. Pt appears safe for discharge. Follow up as indicated in discharge paperwork.   This chart was dictated using voice recognition software, Dragon. Despite the best efforts of this provider to proofread and correct errors, errors may still occur which can change documentation meaning.   Final Clinical Impression(s) / ED Diagnoses Final diagnoses:  None    Rx / DC Orders ED Discharge Orders     None

## 2022-12-03 NOTE — ED Notes (Signed)
Pt went to restroom to collect urine sample. Info staff forgot to collect urine in cup while in restroom.

## 2022-12-04 ENCOUNTER — Ambulatory Visit: Payer: Medicaid Other | Admitting: Family Medicine

## 2022-12-04 ENCOUNTER — Encounter (HOSPITAL_COMMUNITY): Payer: Self-pay

## 2022-12-04 ENCOUNTER — Emergency Department (HOSPITAL_COMMUNITY): Payer: Medicaid Other

## 2022-12-04 LAB — URINALYSIS, ROUTINE W REFLEX MICROSCOPIC
Bacteria, UA: NONE SEEN
Color, Urine: NEGATIVE — AB
RBC / HPF: >50 RBC/hpf — ABNORMAL HIGH (ref 0–5)

## 2022-12-04 MED ORDER — IOHEXOL 300 MG/ML  SOLN
100.0000 mL | Freq: Once | INTRAMUSCULAR | Status: AC | PRN
  Administered 2022-12-04: 100 mL via INTRAVENOUS

## 2022-12-04 NOTE — Discharge Instructions (Signed)
It was a pleasure taking care of you today!   Your labs and CT scan didn't show any concerning findings tonight.  You may take over-the-counter 600 mg ibuprofen every 6 hours and alternate with 500 mg Tylenol every 6 hours as needed for your pain.  Follow-up with your OB/GYN specialist regarding today's ED visit.  Return to the emergency department if you are experiencing increasing/worsening symptoms.

## 2022-12-04 NOTE — ED Notes (Signed)
RN assumed care of pt. She c/o lower abdomen cramping all day.  She started her menstrual cycle this afternoon.  Denies n/v/d.

## 2022-12-05 ENCOUNTER — Telehealth: Payer: Self-pay

## 2022-12-05 NOTE — Telephone Encounter (Signed)
........  Transition Care Management Follow-up Telephone Call Date of discharge and from where: 12/04/22 How have you been since you were released from the hospital? Per pt she is not feeling well.  Any questions or concerns? No  Items Reviewed: Did the pt receive and understand the discharge instructions provided? Yes  Medications obtained and verified? Yes  Other? No  Any new allergies since your discharge? No  Dietary orders reviewed? No Do you have support at home? Yes   Follow up appointments reviewed:  PCP Hospital f/u appt confirmed? Yes  Scheduled to see Tonya on 12/13/22 @ 1:20pm. [facility] f/u appt confirmed? No   Are transportation arrangements needed? No  If their condition worsens, is the pt aware to call PCP or go to the Emergency Dept.? Yes Was the patient provided with contact information for the PCP's office or ED? Yes Was to pt encouraged to call back with questions or concerns? Yes    Elyse Jarvis RMA

## 2022-12-06 ENCOUNTER — Ambulatory Visit (INDEPENDENT_AMBULATORY_CARE_PROVIDER_SITE_OTHER): Payer: Medicaid Other | Admitting: Family Medicine

## 2022-12-06 ENCOUNTER — Encounter: Payer: Self-pay | Admitting: Family Medicine

## 2022-12-06 VITALS — BP 124/80 | Ht 66.0 in | Wt 120.0 lb

## 2022-12-06 DIAGNOSIS — M5416 Radiculopathy, lumbar region: Secondary | ICD-10-CM

## 2022-12-06 NOTE — Progress Notes (Signed)
  Bethany Harris - 22 y.o. female MRN [id number]  Date of birth: [birth year]  SUBJECTIVE:  Including CC & ROS.  No chief complaint on file.   Bethany Harris is a 22 y.o. female that is presenting with worsening of her low back and radicular pain.  The pain is waking her up at night.  No improvement with modalities today.  Pain is severe in nature.  Interview was conducted with an in person interpreter.  Review of Systems See HPI   HISTORY: Past Medical, Surgical, Social, and Family History Reviewed & Updated per EMR.   Pertinent Historical Findings include:  Past Medical History:  Diagnosis Date   GERD (gastroesophageal reflux disease)     History reviewed. No pertinent surgical history.   PHYSICAL EXAM:  VS: BP 124/80   Ht 5\' 6"  (1.676 m)   Wt 120 lb (54.4 kg)   LMP 12/03/2022 (Approximate) Comment: Negative pregnancy test  BMI 19.37 kg/m  Physical Exam Gen: NAD, alert, cooperative with exam, well-appearing MSK:  Back/right leg:  Pain with back flexion and extension. Weakness resistance with hip flexion and extension. Diminished deep tendon reflexes at the patella and Achilles. Positive straight leg raise on the right Neurovascularly intact       ASSESSMENT & PLAN:   Lumbar radiculopathy Still acutely having pain.  No improvement with modalities today.  She has been under the care and supervision of home exercise therapy of physician greater than 6 weeks.  Recent CT scan of the lumbar spine did not show a source of her pain. -Counseled home exercise therapy and supportive care. -Of the lumbar spine to evaluate for nerve impingement consideration of epidural use.

## 2022-12-06 NOTE — Assessment & Plan Note (Signed)
Still acutely having pain.  No improvement with modalities today.  She has been under the care and supervision of home exercise therapy of physician greater than 6 weeks.  Recent CT scan of the lumbar spine did not show a source of her pain. -Counseled home exercise therapy and supportive care. -Of the lumbar spine to evaluate for nerve impingement consideration of epidural use.

## 2022-12-06 NOTE — Patient Instructions (Addendum)
Good to see you Please continue heat  Please continue the exercises  We'll get the MRI at [place]   Please send me a message in MyChart with any questions or updates.  We'll schedule a follow up once the MRi is resulted.   --Dr. Raeford Razor  ?? ????? ??????? ???? ????? ?? ????? ???? ????? ??????? ?? ????? ???? ?? ?? ?? ?? ?? ?? ??????????? ????????? ?????? ???? ????? ?? ???? ?? ??? ???? ?? ?? ???? ?? ?? ??? ????? ???? ?? ????? ????. ?? ?? ?????? ?? ?????? ???? ?? ???? ???? ?? ?? ?? ?? ????? ???.

## 2022-12-08 ENCOUNTER — Other Ambulatory Visit: Payer: Self-pay | Admitting: Family Medicine

## 2022-12-12 ENCOUNTER — Ambulatory Visit: Payer: Medicaid Other | Admitting: Obstetrics and Gynecology

## 2022-12-13 ENCOUNTER — Ambulatory Visit (HOSPITAL_COMMUNITY)
Admission: RE | Admit: 2022-12-13 | Discharge: 2022-12-13 | Disposition: A | Discharge status: Home / Self Care | Payer: Medicaid Other | Source: Ambulatory Visit | Attending: Nurse Practitioner | Admitting: Nurse Practitioner

## 2022-12-13 ENCOUNTER — Ambulatory Visit (INDEPENDENT_AMBULATORY_CARE_PROVIDER_SITE_OTHER): Payer: Medicaid Other | Admitting: Nurse Practitioner

## 2022-12-13 ENCOUNTER — Encounter: Payer: Self-pay | Admitting: Nurse Practitioner

## 2022-12-13 VITALS — BP 100/60 | HR 99 | Temp 98.0°F | Ht 64.0 in | Wt 115.4 lb

## 2022-12-13 DIAGNOSIS — S6991XA Unspecified injury of right wrist, hand and finger(s), initial encounter: Secondary | ICD-10-CM | POA: Insufficient documentation

## 2022-12-13 DIAGNOSIS — Z23 Encounter for immunization: Secondary | ICD-10-CM | POA: Diagnosis not present

## 2022-12-13 HISTORY — DX: Unspecified injury of right wrist, hand and finger(s), initial encounter: S69.91XA

## 2022-12-13 NOTE — Patient Instructions (Addendum)
1. Thumb injury, right, initial encounter  - DG Hand Complete Right  2. Flu vaccine need  - Flu Vaccine QUAD 57mo+IM (Fluarix, Fluzone & Alfiuria Quad PF)   Follow up:  Follow up as schedule   Thumb Sprain  A thumb sprain is an injury to one of the bands of tissue that connect bones to each other (a ligament) in your thumb. The ligament may be stretched too much, or it may be torn. A tear can be either partial or complete. How bad, or severe, the sprain is depends on how much of the ligament was damaged or torn. What are the causes? A thumb sprain is often caused by a fall or an accident, such as when you hold your hands out to catch something or to protect yourself. What increases the risk? This injury is more likely to occur in people who play sports that involve: A risk of falling, such as skiing. Catching an object, such as basketball. What are the signs or symptoms? Symptoms of this condition include: Not being able to move the thumb normally. Swelling. Tenderness. Bruising. How is this diagnosed? This condition may be diagnosed based on: Your symptoms and medical history. Your health care provider may ask about any recent injuries to your thumb. A physical exam. Imaging studies, such as X-rays, ultrasound, or MRI. How is this treated? Treatment for this condition depends on how severe your sprain is. If your ligament is overstretched or partially torn, treatment usually involves keeping your thumb in a fixed position (immobilization) for at least 4 to 6 weeks. Your health care provider will apply a bandage (dressing), splint, brace, or cast to keep your thumb from moving until it heals. If your ligament is fully torn, you may need surgery to reconnect the ligament to the bone. After surgery, you will need to wear a cast or splint on your thumb. Your health care provider may also recommend physical therapy to strengthen your thumb. Follow these instructions at home: If you  have a removable splint, bandage, or brace: Wear the splint, bandage, or brace as told by your health care provider. Remove it only as told by your health care provider. Check the skin around the splint, bandage, or brace every day. Tell your health care provider about any concerns. Loosen the splint, bandage, or brace if your thumb or fingers tingle, become numb, or turn cold and blue. Keep it clean and dry. If you have a nonremovable cast: Do not put pressure on any part of the cast until it is fully hardened. This may take several hours. Do not stick anything inside the cast to scratch your skin. Doing that increases your risk of infection. Check the skin around the cast every day. Tell your health care provider about any concerns. You may put lotion on dry skin around the edges of the cast. Do not put lotion on the skin underneath the cast. Keep it clean and dry. Bathing Do not take baths, swim, or use a hot tub until your health care provider approves. Ask your health care provider if you may take showers. You may only be allowed to take sponge baths. If your splint, bandage, brace, or cast is not waterproof: Do not let it get wet. Cover it with a watertight covering when you take a bath or shower. Managing pain, stiffness, and swelling  If directed, put ice on your thumb. To do this: If you have a removable splint, bandage, or brace, remove it as told by  your health care provider. Put ice in a plastic bag. Place a towel between your skin and the bag, or between your cast and the bag. Leave the ice on for 20 minutes, 2-3 times a day. Remove the ice if your skin turns bright red. This is very important. If you cannot feel pain, heat, or cold, you have a greater risk of damage to the area. Move your fingers often to reduce stiffness and swelling. Raise (elevate) the injured area above the level of your heart while you are sitting or lying down. Activity Return to your normal activities  as told by your health care provider. Ask your health care provider what activities are safe for you. Do physical therapy exercises as directed. After your splint, bandage, brace, or cast is removed, your health care provider may recommend that you: Move your thumb in circles. Touch your thumb to your pinky finger. Do these exercises several times a day. Ask your health care provider if you may use a hand exerciser to strengthen your muscles. If your thumb feels stiff while you are exercising it, try doing the exercises while soaking your hand in warm water. Driving Ask your health care provider when it is safe to drive if you have a splint, bandage, brace, or cast on your hand or thumb. Ask your health care provider if the medicine prescribed to you requires you to avoid driving or using machinery. General instructions Take over-the-counter and prescription medicines only as told by your health care provider. Do not use any products that contain nicotine or tobacco. These products include cigarettes, chewing tobacco, and vaping devices, such as e-cigarettes. These can delay bone healing. If you need help quitting, ask your health care provider. Do not wear rings on your injured thumb. Keep all follow-up visits. This is important. Contact a health care provider if: You have pain that gets worse or does not get better with medicine. You have bruising or swelling that gets worse. Your cast, brace, or splint is damaged. Get help right away if: Your thumb feels numb, tingles, turns cold, or turns blue, even after loosening your splint, bandage, or brace. Summary A thumb sprain is an injury to one of the bands of tissue that connect bones to each other (a ligament) in your thumb. Thumb sprains are more likely to occur in people who play sports that involve a risk of falling or having to catch an object. Treatment will depend on how severe the sprain is, but it will require keeping the thumb in a  fixed position (immobilization). It might require surgery. Make sure you understand and follow all of your health care provider's instructions for home care. This information is not intended to replace advice given to you by your health care provider. Make sure you discuss any questions you have with your health care provider. Document Revised: 10/06/2020 Document Reviewed: 10/06/2020 Elsevier Patient Education  [place].

## 2022-12-13 NOTE — Assessment & Plan Note (Signed)
-  DG Hand Complete Right  2. Flu vaccine need  - Flu Vaccine QUAD 32mo+IM (Fluarix, Fluzone & Alfiuria Quad PF)   Follow up:  Follow up as schedule

## 2022-12-13 NOTE — Progress Notes (Signed)
@Patient  ID: , female    DOB: 05/02/2001, 22 y.o.   MRN: [id number]  Chief Complaint  Patient presents with   Hospitalization Follow-up    Referring provider: [id number], NP   HPI  Patient presents today for hospital follow-up.  She was recently seen in the ED for abdominal pain.  Exam was overall negative.  Patient states that the pain has subsided.  She does have a new issue today.  She closed her thumb in a car door 4 days ago.  This is her thumb to her right hand.  It is still bruised and swollen. Denies f/c/s, n/v/d, hemoptysis, PND, leg swelling Denies chest pain or edema       No Known Allergies  There is no immunization history for the selected administration types on file for this patient.  Past Medical History:  Diagnosis Date   GERD (gastroesophageal reflux disease)     Tobacco History: Social History   Tobacco Use  Smoking Status Never   Passive exposure: Never  Smokeless Tobacco Never   Counseling given: Not Answered   Outpatient Encounter Medications as of 12/13/2022  Medication Sig   cyclobenzaprine (FLEXERIL) 10 MG tablet Take 0.5-1 tablets (5-10 mg total) by mouth 2 (two) times daily as needed for muscle spasms. (Patient not taking: Reported on 10/26/2022)   dicyclomine (BENTYL) 10 MG capsule Take 1 capsule (10 mg total) by mouth 4 (four) times daily -  before meals and at bedtime. (Patient not taking: Reported on 09/05/2022)   fluconazole (DIFLUCAN) 150 MG tablet Take 1 tablet (150 mg total) by mouth daily. (Patient not taking: Reported on 10/26/2022)   gabapentin (NEURONTIN) 100 MG capsule TAKE 1 CAPSULE(100 MG) BY MOUTH THREE TIMES DAILY (Patient not taking: Reported on 12/13/2022)   HYDROcodone-acetaminophen (NORCO/VICODIN) 5-325 MG tablet Take 1 tablet by mouth every 8 (eight) hours as needed. (Patient not taking: Reported on 10/26/2022)   ibuprofen (ADVIL) 600 MG tablet Take 1 tablet (600 mg total) by mouth every 6 (six) hours  as needed. (Patient not taking: Reported on 12/13/2022)   ketoconazole (NIZORAL) 2 % shampoo apply two times per week, massage into scalp and leave in for 10 minutes before rinsing out (Patient not taking: Reported on 09/05/2022)   mometasone (ELOCON) 0.1 % cream Apply to affected itchy skin qd-bid prn, Avoid applying to face, groin, and axilla. Use as directed. Long-term use can cause thinning of the skin. (Patient not taking: Reported on 09/05/2022)   ondansetron (ZOFRAN) 4 MG tablet Take 1 tablet (4 mg total) by mouth every 8 (eight) hours as needed for nausea or vomiting (DURING MENSEES). (Patient not taking: Reported on 12/13/2022)   No facility-administered encounter medications on file as of 12/13/2022.     Review of Systems  Review of Systems  Constitutional: Negative.   HENT: Negative.    Cardiovascular: Negative.   Gastrointestinal: Negative.   Musculoskeletal:        Right thumb swelling and bruising  Allergic/Immunologic: Negative.   Neurological: Negative.   Psychiatric/Behavioral: Negative.         Physical Exam  BP 100/60   Pulse 99   Temp 98 F (36.7 C)   Ht 5\' 4"  (1.626 m)   Wt 115 lb 6.4 oz (52.3 kg)   LMP 12/03/2022 (Approximate) Comment: Negative pregnancy test  SpO2 100%   BMI 19.81 kg/m   Wt Readings from Last 5 Encounters:  12/13/22 115 lb 6.4 oz (52.3 kg)  12/06/22 120 lb (54.4  kg)  12/03/22 120 lb (54.4 kg)  11/03/22 120 lb 1.1 oz (54.5 kg)  11/01/22 120 lb (54.4 kg)     Physical Exam Vitals and nursing note reviewed.  Constitutional:      General: She is not in acute distress.    Appearance: She is well-developed.  Cardiovascular:     Rate and Rhythm: Normal rate and regular rhythm.  Pulmonary:     Effort: Pulmonary effort is normal.     Breath sounds: Normal breath sounds.  Musculoskeletal:     Comments: Swelling and bruising noted to right thumb. Decreased ROM.  Neurological:     Mental Status: She is alert and oriented to person,  place, and time.      Lab Results:  CBC    Component Value Date/Time   WBC 9.1 12/03/2022 1922   RBC 4.50 12/03/2022 1922   HGB 12.9 12/03/2022 1922   HGB 12.8 06/01/2022 1042   HCT 38.0 12/03/2022 1922   HCT 38.3 06/01/2022 1042   PLT 175 12/03/2022 1922   PLT 169 06/01/2022 1042   MCV 84.4 12/03/2022 1922   MCV 86 06/01/2022 1042   MCH 28.7 12/03/2022 1922   MCHC 33.9 12/03/2022 1922   RDW 11.9 12/03/2022 1922   RDW 11.9 06/01/2022 1042   LYMPHSABS 2.8 12/03/2022 1922   MONOABS 0.6 12/03/2022 1922   EOSABS 0.1 12/03/2022 1922   BASOSABS 0.1 12/03/2022 1922    BMET    Component Value Date/Time   NA 137 12/03/2022 1922   K 3.8 12/03/2022 1922   CL 110 12/03/2022 1922   CO2 21 (L) 12/03/2022 1922   GLUCOSE 96 12/03/2022 1922   BUN 12 12/03/2022 1922   CREATININE 0.69 12/03/2022 1922   CALCIUM 9.0 12/03/2022 1922   GFRNONAA >60 12/03/2022 1922    BNP No results found for: "BNP"  ProBNP No results found for: "PROBNP"  Imaging: CT ABDOMEN PELVIS W CONTRAST  Result Date: 12/04/2022 CLINICAL DATA:  Abdominal and back pain since this morning getting worse EXAM: CT ABDOMEN AND PELVIS WITH CONTRAST TECHNIQUE: Multidetector CT imaging of the abdomen and pelvis was performed using the standard protocol following bolus administration of intravenous contrast. RADIATION DOSE REDUCTION: This exam was performed according to the departmental dose-optimization program which includes automated exposure control, adjustment of the mA and/or kV according to patient size and/or use of iterative reconstruction technique. CONTRAST:  193mL OMNIPAQUE IOHEXOL 300 MG/ML  SOLN COMPARISON:  08/29/2022 FINDINGS: Lower chest: No acute abnormality. Hepatobiliary: No suspicious focal liver abnormality is seen. No gallstones, gallbladder wall thickening, or biliary dilatation. Pancreas: Unremarkable. No pancreatic ductal dilatation or surrounding inflammatory changes. Spleen: Normal in size without  focal abnormality. Adrenals/Urinary Tract: Adrenal glands are unremarkable. Kidneys are normal, without renal calculi, suspicious focal lesion, or hydronephrosis. Bladder is unremarkable. Stomach/Bowel: Stomach is within normal limits. No evidence of bowel wall thickening, distention, or inflammatory changes. The appendix is normal. Vascular/Lymphatic: No significant vascular findings are present. No enlarged abdominal or pelvic lymph nodes. Reproductive: Unremarkable. Other: No free intraperitoneal fluid or air. Musculoskeletal: No acute or significant osseous findings. IMPRESSION: Unremarkable CT abdomen and pelvis. Electronically Signed   By: Placido Sou M.D.   On: 12/04/2022 01:03     Assessment & Plan:   Thumb injury, right, initial encounter - DG Hand Complete Right  2. Flu vaccine need  - Flu Vaccine QUAD 30mo+IM (Fluarix, Fluzone & Alfiuria Quad PF)   Follow up:  Follow up as schedule  Fenton Foy, NP 12/13/2022

## 2022-12-16 ENCOUNTER — Ambulatory Visit
Admission: RE | Admit: 2022-12-16 | Discharge: 2022-12-16 | Disposition: A | Discharge status: Home / Self Care | Payer: Medicaid Other | Source: Ambulatory Visit | Attending: Family Medicine | Admitting: Family Medicine

## 2022-12-16 DIAGNOSIS — M5416 Radiculopathy, lumbar region: Secondary | ICD-10-CM

## 2022-12-19 ENCOUNTER — Ambulatory Visit (INDEPENDENT_AMBULATORY_CARE_PROVIDER_SITE_OTHER): Payer: Medicaid Other | Admitting: Family Medicine

## 2022-12-19 ENCOUNTER — Encounter: Payer: Self-pay | Admitting: Family Medicine

## 2022-12-19 VITALS — BP 118/64 | Ht 64.0 in | Wt 116.0 lb

## 2022-12-19 DIAGNOSIS — M5416 Radiculopathy, lumbar region: Secondary | ICD-10-CM

## 2022-12-19 MED ORDER — DIAZEPAM 5 MG PO TABS: ORAL_TABLET | ORAL | 0 refills | Status: DC

## 2022-12-19 NOTE — Assessment & Plan Note (Signed)
Acutely occurring.  Continues to have pain.  MRI was revealing for a disc protrusion. -Counseled on home exercise therapy and supportive care. -Pursue epidural. -Valium for epidural procedure. -Could consider physical therapy going forward.

## 2022-12-19 NOTE — Progress Notes (Signed)
  Yuka Lallier - 22 y.o. female MRN [id number]  Date of birth: 2000-12-24  SUBJECTIVE:  Including CC & ROS.  No chief complaint on file.   Rande Dario is a 22 y.o. female that is following up for her MRI of her lumbar spine.  This was revealing for a disc protrusion at L4-L5 with right lateral recess narrowing..  This visit was completed with an in person interpreter.   Review of Systems See HPI   HISTORY: Past Medical, Surgical, Social, and Family History Reviewed & Updated per EMR.   Pertinent Historical Findings include:  Past Medical History:  Diagnosis Date   GERD (gastroesophageal reflux disease)     History reviewed. No pertinent surgical history.   PHYSICAL EXAM:  VS: BP 118/64   Ht 5\' 4"  (1.626 m)   Wt 116 lb (52.6 kg)   LMP 12/03/2022 (Approximate) Comment: Negative pregnancy test  BMI 19.91 kg/m  Physical Exam Gen: NAD, alert, cooperative with exam, well-appearing MSK:  Neurovascularly intact       ASSESSMENT & PLAN:   Lumbar radiculopathy Acutely occurring.  Continues to have pain.  MRI was revealing for a disc protrusion. -Counseled on home exercise therapy and supportive care. -Pursue epidural. -Valium for epidural procedure. -Could consider physical therapy going forward.

## 2022-12-19 NOTE — Patient Instructions (Signed)
Good to see you We'll send the order for the epidural at [facility] imaging  I have sent valium to take before the procedure.   Please send me a message in MyChart with any questions or updates.  Please message me one week after the procedure..   --Dr. Raeford Razor

## 2023-01-02 ENCOUNTER — Ambulatory Visit
Admission: RE | Admit: 2023-01-02 | Discharge: 2023-01-02 | Disposition: A | Discharge status: Home / Self Care | Payer: Medicaid Other | Source: Ambulatory Visit | Attending: Family Medicine | Admitting: Family Medicine

## 2023-01-02 DIAGNOSIS — M5416 Radiculopathy, lumbar region: Secondary | ICD-10-CM

## 2023-01-02 MED ORDER — IOPAMIDOL (ISOVUE-M 200) INJECTION 41%
1.0000 mL | Freq: Once | INTRAMUSCULAR | Status: AC
  Administered 2023-01-02: 1 mL via EPIDURAL

## 2023-01-02 MED ORDER — METHYLPREDNISOLONE ACETATE 40 MG/ML INJ SUSP (RADIOLOG
80.0000 mg | Freq: Once | INTRAMUSCULAR | Status: AC
  Administered 2023-01-02: 80 mg via EPIDURAL

## 2023-01-02 NOTE — Discharge Instructions (Signed)

## 2023-01-05 ENCOUNTER — Encounter (HOSPITAL_COMMUNITY): Payer: Self-pay

## 2023-01-05 ENCOUNTER — Other Ambulatory Visit: Payer: Self-pay

## 2023-01-05 ENCOUNTER — Emergency Department (HOSPITAL_COMMUNITY)
Admission: EM | Admit: 2023-01-05 | Discharge: 2023-01-05 | Disposition: A | Discharge status: Home / Self Care | Payer: Medicaid Other | Attending: Emergency Medicine | Admitting: Emergency Medicine

## 2023-01-05 DIAGNOSIS — M545 Low back pain, unspecified: Secondary | ICD-10-CM | POA: Insufficient documentation

## 2023-01-05 DIAGNOSIS — R55 Syncope and collapse: Secondary | ICD-10-CM | POA: Insufficient documentation

## 2023-01-05 DIAGNOSIS — R109 Unspecified abdominal pain: Secondary | ICD-10-CM | POA: Insufficient documentation

## 2023-01-05 DIAGNOSIS — R112 Nausea with vomiting, unspecified: Secondary | ICD-10-CM

## 2023-01-05 LAB — COMPREHENSIVE METABOLIC PANEL
ALT: 12 U/L (ref 0–44)
AST: 16 U/L (ref 15–41)
Albumin: 4.4 g/dL (ref 3.5–5.0)
Alkaline Phosphatase: 62 U/L (ref 38–126)
Anion gap: 7 (ref 5–15)
BUN: 17 mg/dL (ref 6–20)
CO2: 25 mmol/L (ref 22–32)
Calcium: 9 mg/dL (ref 8.9–10.3)
Chloride: 107 mmol/L (ref 98–111)
Creatinine, Ser: 0.62 mg/dL (ref 0.44–1.00)
GFR, Estimated: >60 mL/min (ref >60)
Glucose, Bld: 95 mg/dL (ref 70–99)
Potassium: 3.8 mmol/L (ref 3.5–5.1)
Sodium: 139 mmol/L (ref 135–145)
Total Bilirubin: 0.8 mg/dL (ref 0.3–1.2)
Total Protein: 7.6 g/dL (ref 6.5–8.1)

## 2023-01-05 LAB — CBC
HCT: 44.4 % (ref 36.0–46.0)
Hemoglobin: 14.7 g/dL (ref 12.0–15.0)
MCH: 28.5 pg (ref 26.0–34.0)
MCHC: 33.1 g/dL (ref 30.0–36.0)
MCV: 86.2 fL (ref 80.0–100.0)
Platelets: 161 10*3/uL (ref 150–400)
RBC: 5.15 MIL/uL — ABNORMAL HIGH (ref 3.87–5.11)
RDW: 11.8 % (ref 11.5–15.5)
WBC: 11.8 10*3/uL — ABNORMAL HIGH (ref 4.0–10.5)
nRBC: 0 % (ref 0.0–0.2)

## 2023-01-05 LAB — URINALYSIS, ROUTINE W REFLEX MICROSCOPIC
Bilirubin Urine: NEGATIVE
Glucose, UA: NEGATIVE mg/dL
Ketones, ur: NEGATIVE mg/dL
Nitrite: NEGATIVE
Protein, ur: NEGATIVE mg/dL
Specific Gravity, Urine: 1.023 (ref 1.005–1.030)
pH: 7 (ref 5.0–8.0)

## 2023-01-05 LAB — I-STAT BETA HCG BLOOD, ED (MC, WL, AP ONLY): I-stat hCG, quantitative: <5 m[IU]/mL (ref <5)

## 2023-01-05 LAB — LIPASE, BLOOD: Lipase: 53 U/L — ABNORMAL HIGH (ref 11–51)

## 2023-01-05 LAB — CBG MONITORING, ED: Glucose-Capillary: 96 mg/dL (ref 70–99)

## 2023-01-05 MED ORDER — SODIUM CHLORIDE 0.9 % IV BOLUS
1000.0000 mL | Freq: Once | INTRAVENOUS | Status: AC
  Administered 2023-01-05: 1000 mL via INTRAVENOUS

## 2023-01-05 MED ORDER — ONDANSETRON HCL 4 MG PO TABS: 4.0000 mg | ORAL_TABLET | Freq: Four times a day (QID) | ORAL | 0 refills | Status: DC

## 2023-01-05 MED ORDER — ONDANSETRON HCL 4 MG/2ML IJ SOLN
4.0000 mg | Freq: Once | INTRAMUSCULAR | Status: AC
  Administered 2023-01-05: 4 mg via INTRAVENOUS
  Filled 2023-01-05: qty 2

## 2023-01-05 MED ORDER — KETOROLAC TROMETHAMINE 15 MG/ML IJ SOLN
15.0000 mg | Freq: Once | INTRAMUSCULAR | Status: AC
  Administered 2023-01-05: 15 mg via INTRAVENOUS
  Filled 2023-01-05: qty 1

## 2023-01-05 NOTE — Discharge Instructions (Signed)
Return for any problem.  ?

## 2023-01-05 NOTE — ED Provider Notes (Signed)
[place] EMERGENCY DEPARTMENT AT [facility] Provider Note   CSN: [id number] Arrival date & time: 01/05/23  [id number]     History  Chief Complaint  Patient presents with   Emesis    Bethany Harris is a 22 y.o. female.  22 year old female with prior medical history as detailed below presents for evaluation.  Patient reports abdominal cramping and vomiting that began this morning.  She reports approximately 4 episodes of emesis.  She reports brief syncopal event after the fourth episode of emesis.  She denies associated chest pain.  She denies fever.  She reports abdominal cramping "like she is going to have diarrhea" but she has not yet had diarrhea.  She denies headache or vision change.  She denies focal weakness.    She complains of chronic right-sided low back pain.  She denies urinary symptoms.  She denies change in bowel movements.  Chart review reveals several visits to ED's over the last 6 months for abdominal pain.  Prior workup on the whole has been without evidence of significant acute pathology.  Multiple CT abdomen pelvis studies performed over the last 6 months have been without significant findings.  The history is provided by the patient and medical records.       Home Medications Prior to Admission medications   Medication Sig Start Date End Date Taking? Authorizing Provider  cyclobenzaprine (FLEXERIL) 10 MG tablet Take 0.5-1 tablets (5-10 mg total) by mouth 2 (two) times daily as needed for muscle spasms. Patient not taking: Reported on 10/26/2022 09/20/22   Margarita Mail, PA-C  diazepam (VALIUM) 5 MG tablet Please take 1.5 hours prior to procedure. May repeat x 1. 12/19/22   Rosemarie Ax, MD  dicyclomine (BENTYL) 10 MG capsule Take 1 capsule (10 mg total) by mouth 4 (four) times daily -  before meals and at bedtime. Patient not taking: Reported on 09/05/2022 06/01/22   Gabriel Carina, CNM  fluconazole (DIFLUCAN) 150 MG tablet Take 1 tablet (150 mg  total) by mouth daily. Patient not taking: Reported on 10/26/2022 09/28/22   Fenton Foy, NP  gabapentin (NEURONTIN) 100 MG capsule TAKE 1 CAPSULE(100 MG) BY MOUTH THREE TIMES DAILY Patient not taking: Reported on 12/13/2022 12/08/22   Rosemarie Ax, MD  HYDROcodone-acetaminophen (NORCO/VICODIN) 5-325 MG tablet Take 1 tablet by mouth every 8 (eight) hours as needed. Patient not taking: Reported on 10/26/2022 10/18/22   Rosemarie Ax, MD  ibuprofen (ADVIL) 600 MG tablet Take 1 tablet (600 mg total) by mouth every 6 (six) hours as needed. Patient not taking: Reported on 12/13/2022 11/01/22   Rosemarie Ax, MD  ketoconazole (NIZORAL) 2 % shampoo apply two times per week, massage into scalp and leave in for 10 minutes before rinsing out Patient not taking: Reported on 09/05/2022 05/08/22   Brendolyn Patty, MD  mometasone (ELOCON) 0.1 % cream Apply to affected itchy skin qd-bid prn, Avoid applying to face, groin, and axilla. Use as directed. Long-term use can cause thinning of the skin. Patient not taking: Reported on 09/05/2022 05/08/22   Brendolyn Patty, MD  ondansetron (ZOFRAN) 4 MG tablet Take 1 tablet (4 mg total) by mouth every 8 (eight) hours as needed for nausea or vomiting (DURING MENSEES). Patient not taking: Reported on 12/13/2022 11/03/22   Theressa Stamps R, PA      Allergies    Patient has no known allergies.    Review of Systems   Review of Systems  All other systems reviewed and  are negative.   Physical Exam Updated Vital Signs BP (!) 111/96   Pulse (!) 137   Temp 98.1 F (36.7 C) (Oral)   Resp 18   SpO2 100%  Physical Exam Vitals and nursing note reviewed.  Constitutional:      General: She is not in acute distress.    Appearance: Normal appearance. She is well-developed.  HENT:     Head: Normocephalic and atraumatic.  Eyes:     Conjunctiva/sclera: Conjunctivae normal.     Pupils: Pupils are equal, round, and reactive to light.  Cardiovascular:     Rate and  Rhythm: Normal rate and regular rhythm.     Heart sounds: Normal heart sounds.  Pulmonary:     Effort: Pulmonary effort is normal. No respiratory distress.     Breath sounds: Normal breath sounds.  Abdominal:     General: There is no distension.     Palpations: Abdomen is soft.     Tenderness: There is no abdominal tenderness.  Musculoskeletal:        General: No deformity. Normal range of motion.     Cervical back: Normal range of motion and neck supple.  Skin:    General: Skin is warm and dry.  Neurological:     General: No focal deficit present.     Mental Status: She is alert and oriented to person, place, and time.     ED Results / Procedures / Treatments   Labs (all labs ordered are listed, but only abnormal results are displayed) Labs Reviewed  LIPASE, BLOOD - Abnormal; Notable for the following components:      Result Value   Lipase 53 (*)    All other components within normal limits  CBC - Abnormal; Notable for the following components:   WBC 11.8 (*)    RBC 5.15 (*)    All other components within normal limits  COMPREHENSIVE METABOLIC PANEL  URINALYSIS, ROUTINE W REFLEX MICROSCOPIC  I-STAT BETA HCG BLOOD, ED ([facility], [facility], [facility] ONLY)  CBG MONITORING, ED    EKG EKG Interpretation  Date/Time:  Friday January 05 2023 09:13:10 EST Ventricular Rate:  108 PR Interval:  99 QRS Duration: 83 QT Interval:  319 QTC Calculation: 428 R Axis:   71 Text Interpretation: Sinus tachycardia Confirmed by Dene Gentry [phone]) on 01/05/2023 9:21:35 AM  Radiology No results found.  Procedures Procedures    Medications Ordered in ED Medications  ondansetron (ZOFRAN) injection 4 mg (4 mg Intravenous Given 01/05/23 0943)  sodium chloride 0.9 % bolus 1,000 mL (1,000 mLs Intravenous New Bag/Given 01/05/23 1009)  ketorolac (TORADOL) 15 MG/ML injection 15 mg (15 mg Intravenous Given 01/05/23 0944)    ED Course/ Medical Decision Making/ A&P                             Medical  Decision Making Amount and/or Complexity of Data Reviewed Labs: ordered.  Risk Prescription drug management.    Medical Screen Complete  This patient presented to the ED with complaint of nausea, vomiting.  This complaint involves an extensive number of treatment options. The initial differential diagnosis includes, but is not limited to, metabolic abnormality, etc  This presentation is: Acute, Chronic, Self-Limited, Previously Undiagnosed, Uncertain Prognosis, Complicated, Systemic Symptoms, and Threat to Life/Bodily Function  Patient is presenting with nausea and vomiting.  Patient reports brief syncopal episode associate with same which is consistent with likely vasovagal syncope related to the vomiting episode.  Patient appears improved on initial exam.  Screening labs obtained are without significant abnormality.  After ED evaluation the patient feels significantly improved.  She desires discharge home.  She does understand need for close outpatient follow-up.  Strict return precautions given and understood.      Additional history obtained:  External records from outside sources obtained and reviewed including prior ED visits and prior Inpatient records.    Lab Tests:  I ordered and personally interpreted labs.  The pertinent results include: CBC, CMP, UA, ECG  Cardiac Monitoring:  The patient was maintained on a cardiac monitor.  I personally viewed and interpreted the cardiac monitor which showed an underlying rhythm of: NSR   Medicines ordered:  I ordered medication including Toradol, Zofran for pain, nausea Reevaluation of the patient after these medicines showed that the patient: improved    Problem List / ED Course:  Nausea, vomiting   Reevaluation:  After the interventions noted above, I reevaluated the patient and found that they have: improved     Disposition:  After consideration of the diagnostic results and the patients response to  treatment, I feel that the patent would benefit from close outpatient follow-up.          Final Clinical Impression(s) / ED Diagnoses Final diagnoses:  Nausea and vomiting, unspecified vomiting type    Rx / DC Orders ED Discharge Orders          Ordered    ondansetron (ZOFRAN) 4 MG tablet  Every 6 hours        01/05/23 1113              Valarie Merino, MD 01/05/23 1130

## 2023-01-05 NOTE — ED Triage Notes (Signed)
Pt states she vomiting 4x this am and passed out. She is also having back pain which she states she got an injection this week for.

## 2023-01-06 ENCOUNTER — Other Ambulatory Visit: Payer: Self-pay

## 2023-01-06 ENCOUNTER — Emergency Department (HOSPITAL_COMMUNITY)
Admission: EM | Admit: 2023-01-06 | Discharge: 2023-01-06 | Disposition: A | Discharge status: Home / Self Care | Payer: Medicaid Other | Attending: Emergency Medicine | Admitting: Emergency Medicine

## 2023-01-06 DIAGNOSIS — R112 Nausea with vomiting, unspecified: Secondary | ICD-10-CM | POA: Diagnosis not present

## 2023-01-06 DIAGNOSIS — R55 Syncope and collapse: Secondary | ICD-10-CM | POA: Diagnosis not present

## 2023-01-06 DIAGNOSIS — Z79899 Other long term (current) drug therapy: Secondary | ICD-10-CM | POA: Insufficient documentation

## 2023-01-06 DIAGNOSIS — R1013 Epigastric pain: Secondary | ICD-10-CM | POA: Diagnosis present

## 2023-01-06 LAB — URINALYSIS, ROUTINE W REFLEX MICROSCOPIC
Bacteria, UA: NONE SEEN
Bilirubin Urine: NEGATIVE
Glucose, UA: NEGATIVE mg/dL
Ketones, ur: NEGATIVE mg/dL
Leukocytes,Ua: NEGATIVE
Nitrite: NEGATIVE
Protein, ur: NEGATIVE mg/dL
Specific Gravity, Urine: 1.016 (ref 1.005–1.030)
pH: 7 (ref 5.0–8.0)

## 2023-01-06 LAB — CBC
HCT: 41.8 % (ref 36.0–46.0)
Hemoglobin: 14 g/dL (ref 12.0–15.0)
MCH: 28.6 pg (ref 26.0–34.0)
MCHC: 33.5 g/dL (ref 30.0–36.0)
MCV: 85.5 fL (ref 80.0–100.0)
Platelets: 184 10*3/uL (ref 150–400)
RBC: 4.89 MIL/uL (ref 3.87–5.11)
RDW: 11.8 % (ref 11.5–15.5)
WBC: 8.1 10*3/uL (ref 4.0–10.5)
nRBC: 0 % (ref 0.0–0.2)

## 2023-01-06 LAB — COMPREHENSIVE METABOLIC PANEL WITH GFR
ALT: 15 U/L (ref 0–44)
AST: 18 U/L (ref 15–41)
Albumin: 4.4 g/dL (ref 3.5–5.0)
Alkaline Phosphatase: 70 U/L (ref 38–126)
Anion gap: 7 (ref 5–15)
BUN: 15 mg/dL (ref 6–20)
CO2: 23 mmol/L (ref 22–32)
Calcium: 9.2 mg/dL (ref 8.9–10.3)
Chloride: 108 mmol/L (ref 98–111)
Creatinine, Ser: 0.65 mg/dL (ref 0.44–1.00)
GFR, Estimated: >60 mL/min (ref >60)
Glucose, Bld: 107 mg/dL — ABNORMAL HIGH (ref 70–99)
Potassium: 3.7 mmol/L (ref 3.5–5.1)
Sodium: 138 mmol/L (ref 135–145)
Total Bilirubin: 1.2 mg/dL (ref 0.3–1.2)
Total Protein: 7.4 g/dL (ref 6.5–8.1)

## 2023-01-06 LAB — RAPID URINE DRUG SCREEN, HOSP PERFORMED
Amphetamines: NOT DETECTED
Barbiturates: NOT DETECTED
Benzodiazepines: POSITIVE — AB
Cocaine: NOT DETECTED
Opiates: NOT DETECTED
Tetrahydrocannabinol: NOT DETECTED

## 2023-01-06 LAB — LIPASE, BLOOD: Lipase: 44 U/L (ref 11–51)

## 2023-01-06 MED ORDER — KETOROLAC TROMETHAMINE 30 MG/ML IJ SOLN
15.0000 mg | Freq: Once | INTRAMUSCULAR | Status: AC
  Administered 2023-01-06: 15 mg via INTRAVENOUS
  Filled 2023-01-06: qty 1

## 2023-01-06 MED ORDER — SUCRALFATE 1 G PO TABS: 1.0000 g | ORAL_TABLET | Freq: Three times a day (TID) | ORAL | 0 refills | Status: DC

## 2023-01-06 MED ORDER — OMEPRAZOLE 20 MG PO CPDR: 20.0000 mg | DELAYED_RELEASE_CAPSULE | Freq: Every day | ORAL | 0 refills | Status: DC

## 2023-01-06 MED ORDER — FAMOTIDINE IN NACL 20-0.9 MG/50ML-% IV SOLN
20.0000 mg | Freq: Once | INTRAVENOUS | Status: AC
  Administered 2023-01-06: 20 mg via INTRAVENOUS
  Filled 2023-01-06: qty 50

## 2023-01-06 MED ORDER — LACTATED RINGERS IV BOLUS
1000.0000 mL | Freq: Once | INTRAVENOUS | Status: AC
  Administered 2023-01-06: 1000 mL via INTRAVENOUS

## 2023-01-06 MED ORDER — ONDANSETRON HCL 4 MG/2ML IJ SOLN
4.0000 mg | Freq: Once | INTRAMUSCULAR | Status: AC
  Administered 2023-01-06: 4 mg via INTRAVENOUS
  Filled 2023-01-06: qty 2

## 2023-01-06 NOTE — ED Triage Notes (Signed)
Was treated for emesis and dizziness at this ER this AM. Returns because her sym,ptoms worsened and are now accompanied by epigastric stabbing pain. No relief from prescribed nausea.

## 2023-01-06 NOTE — ED Provider Notes (Signed)
[facility] Emergency Department Provider Note MRN:  [id number]  Arrival date & time: 01/06/23     Chief Complaint   Abdominal Pain   History of Present Illness   Bethany Harris is a 22 y.o. year-old female presents to the ED with chief complaint of abdominal pain, nausea, vomiting, and syncope.  States that she was seen this morning for the same.  Felt better at time of discharge.  Several similar presentations in the past 6 months.  History provided by patient.   Review of Systems  Pertinent positive and negative review of systems noted in HPI.    Physical Exam   Vitals:   01/06/23 0024  BP: 108/70  Pulse: 94  Resp: 20  Temp: 98 F (36.7 C)  SpO2: 100%    CONSTITUTIONAL:  non toxic-appearing, NAD NEURO:  Alert and oriented x 3, CN 3-12 grossly intact EYES:  eyes equal and reactive ENT/NECK:  Supple, no stridor  CARDIO:  normal rate, regular rhythm, appears well-perfused  PULM:  No respiratory distress, CTAB GI/GU:  non-distended, generalized abdominal discomfort MSK/SPINE:  No gross deformities, no edema, moves all extremities  SKIN:  no rash, atraumatic   *Additional and/or pertinent findings included in MDM below  Diagnostic and Interventional Summary    EKG Interpretation  Date/Time:    Ventricular Rate:    PR Interval:    QRS Duration:   QT Interval:    QTC Calculation:   R Axis:     Text Interpretation:         Labs Reviewed  COMPREHENSIVE METABOLIC PANEL - Abnormal; Notable for the following components:      Result Value   Glucose, Bld 107 (*)    All other components within normal limits  URINALYSIS, ROUTINE W REFLEX MICROSCOPIC - Abnormal; Notable for the following components:   Hgb urine dipstick SMALL (*)    All other components within normal limits  RAPID URINE DRUG SCREEN, HOSP PERFORMED - Abnormal; Notable for the following components:   Benzodiazepines POSITIVE (*)    All other components within normal limits   LIPASE, BLOOD  CBC    No orders to display    Medications  ketorolac (TORADOL) 30 MG/ML injection 15 mg (15 mg Intravenous Given 01/06/23 0133)  ondansetron (ZOFRAN) injection 4 mg (4 mg Intravenous Given 01/06/23 0133)  lactated ringers bolus 1,000 mL (0 mLs Intravenous Stopped 01/06/23 0328)  famotidine (PEPCID) IVPB 20 mg premix (0 mg Intravenous Stopped 01/06/23 0214)     Procedures  /  Critical Care Procedures  ED Course and Medical Decision Making  I have reviewed the triage vital signs, the nursing notes, and pertinent available records from the EMR.  Social Determinants Affecting Complexity of Care: Patient .   ED Course:    Medical Decision Making Patient here with epigastric pain.  Seen earlier for the same.  Reports associated nausea and vomiting.  Denies fevers.  Will check labs and treat symptoms.  Treated with IV toradol, pepcid, and fluids.  Patient requesting discharge.  Tolerating PO.  Feels better.  Consider PUD?  Will give RX for omeprazole and carafate.  Amount and/or Complexity of Data Reviewed Labs: ordered.  Risk Prescription drug management.     Consultants: No consultations were needed in caring for this patient.   Treatment and Plan: Emergency department workup does not suggest an emergent condition requiring admission or immediate intervention beyond  what has been performed at this time. The patient is safe for discharge  and has  been instructed to return immediately for worsening symptoms, change in  symptoms or any other concerns    Final Clinical Impressions(s) / ED Diagnoses     ICD-10-CM   1. Epigastric pain  R10.13       ED Discharge Orders          Ordered    omeprazole (PRILOSEC) 20 MG capsule  Daily        01/06/23 0333    sucralfate (CARAFATE) 1 g tablet  3 times daily with meals & bedtime        01/06/23 [id number]              Discharge Instructions Discussed with and Provided to Patient:     Discharge  Instructions      No certain cause for your abdominal pain has been found.  I recommend that you follow-up with your primary care doctor.  Avoid spicy, greasy, and fatty foods.  Take medications as prescribed.       Montine Circle, PA-C 01/06/23 0401    Orpah Greek, MD 01/06/23 [phone]

## 2023-01-06 NOTE — Discharge Instructions (Signed)
No certain cause for your abdominal pain has been found.  I recommend that you follow-up with your primary care doctor.  Avoid spicy, greasy, and fatty foods.  Take medications as prescribed.

## 2023-01-10 ENCOUNTER — Telehealth: Payer: Self-pay

## 2023-01-10 NOTE — Transitions of Care (Post Inpatient/ED Visit) (Signed)
   01/10/2023  Name: Bethany Harris MRN: [id number] DOB: [birth year]  Today's TOC FU Call Status:    Attempted to reach the patient regarding the most recent Inpatient/ED visit.  Follow Up Plan: Additional outreach attempts will be made to reach the patient to complete the Transitions of Care (Post Inpatient/ED visit) call.   [place] RMA

## 2023-01-18 ENCOUNTER — Encounter: Payer: Self-pay | Admitting: Family Medicine

## 2023-01-18 ENCOUNTER — Emergency Department (HOSPITAL_COMMUNITY): Admission: EM | Admit: 2023-01-18 | Payer: Medicaid Other | Source: Home / Self Care

## 2023-01-18 ENCOUNTER — Ambulatory Visit (INDEPENDENT_AMBULATORY_CARE_PROVIDER_SITE_OTHER): Payer: Medicaid Other | Admitting: Family Medicine

## 2023-01-18 ENCOUNTER — Other Ambulatory Visit: Payer: Self-pay | Admitting: *Deleted

## 2023-01-18 VITALS — BP 94/72 | Ht 64.0 in | Wt 114.0 lb

## 2023-01-18 DIAGNOSIS — M5416 Radiculopathy, lumbar region: Secondary | ICD-10-CM

## 2023-01-18 DIAGNOSIS — N898 Other specified noninflammatory disorders of vagina: Secondary | ICD-10-CM | POA: Diagnosis not present

## 2023-01-18 MED ORDER — METRONIDAZOLE 0.75 % EX GEL: CUTANEOUS | 0 refills | Status: DC

## 2023-01-18 MED ORDER — GABAPENTIN 100 MG PO CAPS: ORAL_CAPSULE | ORAL | 1 refills | Status: DC

## 2023-01-18 MED ORDER — FLUCONAZOLE 150 MG PO TABS: 150.0000 mg | ORAL_TABLET | Freq: Once | ORAL | 0 refills | Status: AC

## 2023-01-18 MED ORDER — METRONIDAZOLE 500 MG PO TABS: 500.0000 mg | ORAL_TABLET | Freq: Two times a day (BID) | ORAL | 0 refills | Status: AC

## 2023-01-18 NOTE — Assessment & Plan Note (Signed)
Continues to have pain in a radicular fashion down the leg with no improvement with recent epidural. -Counseled on home exercise therapy and supportive care. -Counseled to restart physical therapy. -Gabapentin. -Could consider referral to neurosurgery.

## 2023-01-18 NOTE — Progress Notes (Signed)
  Bethany Harris - 22 y.o. female MRN [id number]  Date of birth: September 12, 2001  SUBJECTIVE:  Including CC & ROS.  No chief complaint on file.   Bethany Harris is a 22 y.o. female that is following up for her radicular pain and low back pain.  She reports no improvement with the epidural.  Continues to have pain throughout the course of the day.  She also reports a discharge over the past 2 months.  It is whitish in burning.  An In person interpreter was used for this interview.  Review of Systems See HPI   HISTORY: Past Medical, Surgical, Social, and Family History Reviewed & Updated per EMR.   Pertinent Historical Findings include:  Past Medical History:  Diagnosis Date   GERD (gastroesophageal reflux disease)     History reviewed. No pertinent surgical history.   PHYSICAL EXAM:  VS: BP 94/72 (BP Location: Left Arm, Patient Position: Sitting)   Ht 5' 4"$  (1.626 m)   Wt 114 lb (51.7 kg)   BMI 19.57 kg/m  Physical Exam Gen: NAD, alert, cooperative with exam, well-appearing MSK:  Neurovascularly intact       ASSESSMENT & PLAN:   Lumbar radiculopathy Continues to have pain in a radicular fashion down the leg with no improvement with recent epidural. -Counseled on home exercise therapy and supportive care. -Counseled to restart physical therapy. -Gabapentin. -Could consider referral to neurosurgery.  Vaginal discharge Ongoing for the past few months and unable to get into the gynecologist. -Provided Flagyl followed by Diflucan.

## 2023-01-18 NOTE — Patient Instructions (Signed)
Good to see you Please call to restart physical therapy  Please start with one gabapentin at night. You can increase this to 2 or 3 times daily  Please start with the flagyl. You can take the diflucan after completing the flagyl if symptoms are still present.  Please try heat as needed  Please use the ibuprofen as needed  Please send me a message in MyChart with any questions or updates.  Please see me back in 6 weeks.   --Dr. Raeford Razor

## 2023-01-18 NOTE — Assessment & Plan Note (Signed)
Ongoing for the past few months and unable to get into the gynecologist. -Provided Flagyl followed by Diflucan.

## 2023-01-31 ENCOUNTER — Other Ambulatory Visit: Payer: Self-pay

## 2023-01-31 ENCOUNTER — Encounter (HOSPITAL_COMMUNITY): Payer: Self-pay

## 2023-01-31 ENCOUNTER — Emergency Department (HOSPITAL_COMMUNITY): Payer: Medicaid Other

## 2023-01-31 ENCOUNTER — Emergency Department (HOSPITAL_COMMUNITY)
Admission: EM | Admit: 2023-01-31 | Discharge: 2023-01-31 | Disposition: A | Discharge status: Home / Self Care | Payer: Medicaid Other | Attending: Emergency Medicine | Admitting: Emergency Medicine

## 2023-01-31 DIAGNOSIS — R55 Syncope and collapse: Secondary | ICD-10-CM | POA: Diagnosis present

## 2023-01-31 DIAGNOSIS — N946 Dysmenorrhea, unspecified: Secondary | ICD-10-CM | POA: Diagnosis not present

## 2023-01-31 LAB — COMPREHENSIVE METABOLIC PANEL
ALT: 13 U/L (ref 0–44)
AST: 17 U/L (ref 15–41)
Albumin: 4.6 g/dL (ref 3.5–5.0)
Alkaline Phosphatase: 66 U/L (ref 38–126)
Anion gap: 3 — ABNORMAL LOW (ref 5–15)
BUN: 12 mg/dL (ref 6–20)
CO2: 23 mmol/L (ref 22–32)
Calcium: 8.6 mg/dL — ABNORMAL LOW (ref 8.9–10.3)
Chloride: 111 mmol/L (ref 98–111)
Creatinine, Ser: 0.63 mg/dL (ref 0.44–1.00)
GFR, Estimated: >60 mL/min (ref >60)
Glucose, Bld: 104 mg/dL — ABNORMAL HIGH (ref 70–99)
Potassium: 3.7 mmol/L (ref 3.5–5.1)
Sodium: 137 mmol/L (ref 135–145)
Total Bilirubin: 0.9 mg/dL (ref 0.3–1.2)
Total Protein: 7.3 g/dL (ref 6.5–8.1)

## 2023-01-31 LAB — URINALYSIS, W/ REFLEX TO CULTURE (INFECTION SUSPECTED)
Bilirubin Urine: NEGATIVE
Glucose, UA: NEGATIVE mg/dL
Ketones, ur: NEGATIVE mg/dL
Nitrite: NEGATIVE
Protein, ur: 30 mg/dL — AB
RBC / HPF: >50 RBC/hpf (ref 0–5)
Specific Gravity, Urine: 1.011 (ref 1.005–1.030)
pH: 7 (ref 5.0–8.0)

## 2023-01-31 LAB — CBC WITH DIFFERENTIAL/PLATELET
Abs Immature Granulocytes: 0.01 K/uL (ref 0.00–0.07)
Basophils Absolute: 0 K/uL (ref 0.0–0.1)
Basophils Relative: 0 %
Eosinophils Absolute: 0.1 K/uL (ref 0.0–0.5)
Eosinophils Relative: 2 %
HCT: 38.9 % (ref 36.0–46.0)
Hemoglobin: 12.9 g/dL (ref 12.0–15.0)
Immature Granulocytes: 0 %
Lymphocytes Relative: 25 %
Lymphs Abs: 2.1 K/uL (ref 0.7–4.0)
MCH: 28.4 pg (ref 26.0–34.0)
MCHC: 33.2 g/dL (ref 30.0–36.0)
MCV: 85.5 fL (ref 80.0–100.0)
Monocytes Absolute: 0.6 K/uL (ref 0.1–1.0)
Monocytes Relative: 7 %
Neutro Abs: 5.6 K/uL (ref 1.7–7.7)
Neutrophils Relative %: 66 %
Platelets: 178 K/uL (ref 150–400)
RBC: 4.55 MIL/uL (ref 3.87–5.11)
RDW: 12.1 % (ref 11.5–15.5)
WBC: 8.4 K/uL (ref 4.0–10.5)
nRBC: 0 % (ref 0.0–0.2)

## 2023-01-31 LAB — LIPASE, BLOOD: Lipase: 51 U/L (ref 11–51)

## 2023-01-31 LAB — PREGNANCY, URINE: Preg Test, Ur: NEGATIVE

## 2023-01-31 MED ORDER — METOCLOPRAMIDE HCL 5 MG/ML IJ SOLN
10.0000 mg | Freq: Once | INTRAMUSCULAR | Status: AC
  Administered 2023-01-31: 10 mg via INTRAVENOUS
  Filled 2023-01-31: qty 2

## 2023-01-31 MED ORDER — ACETAMINOPHEN 500 MG PO TABS
1000.0000 mg | ORAL_TABLET | Freq: Once | ORAL | Status: AC
  Administered 2023-01-31: 1000 mg via ORAL
  Filled 2023-01-31: qty 2

## 2023-01-31 MED ORDER — LACTATED RINGERS IV BOLUS
1000.0000 mL | Freq: Once | INTRAVENOUS | Status: AC
  Administered 2023-01-31: 1000 mL via INTRAVENOUS

## 2023-01-31 NOTE — ED Triage Notes (Signed)
Pt reports that she is currently 4 days into her menstrual cycle and has had increased bleeding with dizziness and abdominal cramping. Pt had syncopal spell tonight at 1900 while at school. Pt denies hitting head but has little recollection of event.

## 2023-01-31 NOTE — ED Provider Triage Note (Signed)
Emergency Medicine Provider Triage Evaluation Note  Bethany Harris , a 22 y.o. female  was evaluated in triage.  Pt complains of period cramps with nausea and vomiting.  Patient reports that she had a syncopal episode today after vomiting in the bathroom.  She does think she hit her head however has little recollection of the event.  From looking at previous chart, patient is here once a month around this time for her menstrual symptoms.  Review of Systems  Positive:  Negative:   Physical Exam  BP 106/64   Pulse 91   Temp 98.3 F (36.8 C) (Oral)   Resp 16   Ht '5\' 4"'$  (1.626 m)   Wt 52 kg   LMP 01/29/2023 (Exact Date)   SpO2 100%   BMI 19.68 kg/m  Gen:   Awake, no distress   Resp:  Normal effort  MSK:   Moves extremities without difficulty  Other:  Diffuse abdominal tenderness to palpation.  Medical Decision Making  Medically screening exam initiated at 7:52 PM.  Appropriate orders placed.  Bethany Harris was informed that the remainder of the evaluation will be completed by another provider, this initial triage assessment does not replace that evaluation, and the importance of remaining in the ED until their evaluation is complete.  Labs and imaging ordered   Bethany Harris, [place] 01/31/23 [id number]

## 2023-01-31 NOTE — Discharge Instructions (Signed)
You were seen in the emergency department for your episode of passing out.  You had no injuries to your head from passing out and had normal heart rhythms without signs of anemia or dehydration.  It is unclear what caused you to pass out today but may have been secondary to your abdominal cramps.  You can take Tylenol and Motrin as needed for cramping and you should follow-up with your primary doctor as well as GYN to have your symptoms rechecked.  You should return to the emergency department if you have recurrent episodes of passing out, severe chest pain, severe abdominal pain or any other new or concerning symptoms.

## 2023-01-31 NOTE — ED Notes (Signed)
Sent urine with culture

## 2023-01-31 NOTE — ED Provider Notes (Signed)
[place] [facility] DEPARTMENT AT [facility] Provider Note   CSN: [id number] Arrival date & time: 01/31/23  1938     History  Chief Complaint  Patient presents with   Loss of Consciousness    Bethany Harris is a 22 y.o. female.  Patient is a 22 year old female with no significant past medical history presenting to the emergency department after an episode of syncope.  The patient states that she has been on her period for the last 5 days and her periods recently have been come more heavy and painful.  She states that today while in class she got severe cramps and went up to go to the bathroom that she felt nauseous but passed out.  She states that she did hit her head and has been having headaches since then.  She states that she still feels nauseous and had 1 episode of vomiting.  She denies any fevers or chills, dysuria or hematuria or abnormal vaginal discharge.  The history is provided by the patient. No language interpreter was used (Fluent [place]).  Loss of Consciousness      Home Medications Prior to Admission medications   Medication Sig Start Date End Date Taking? Authorizing Provider  cyclobenzaprine (FLEXERIL) 10 MG tablet Take 0.5-1 tablets (5-10 mg total) by mouth 2 (two) times daily as needed for muscle spasms. Patient not taking: Reported on 10/26/2022 09/20/22   Margarita Mail, PA-C  diazepam (VALIUM) 5 MG tablet Please take 1.5 hours prior to procedure. May repeat x 1. 12/19/22   Rosemarie Ax, MD  dicyclomine (BENTYL) 10 MG capsule Take 1 capsule (10 mg total) by mouth 4 (four) times daily -  before meals and at bedtime. Patient not taking: Reported on 09/05/2022 06/01/22   Gaylan Gerold R, CNM  gabapentin (NEURONTIN) 100 MG capsule TAKE 1 CAPSULE(100 MG) BY MOUTH THREE TIMES DAILY 01/18/23   Rosemarie Ax, MD  HYDROcodone-acetaminophen (NORCO/VICODIN) 5-325 MG tablet Take 1 tablet by mouth every 8 (eight) hours as needed. Patient not taking:  Reported on 10/26/2022 10/18/22   Rosemarie Ax, MD  ibuprofen (ADVIL) 600 MG tablet Take 1 tablet (600 mg total) by mouth every 6 (six) hours as needed. Patient not taking: Reported on 12/13/2022 11/01/22   Rosemarie Ax, MD  ketoconazole (NIZORAL) 2 % shampoo apply two times per week, massage into scalp and leave in for 10 minutes before rinsing out Patient not taking: Reported on 09/05/2022 05/08/22   Brendolyn Patty, MD  mometasone (ELOCON) 0.1 % cream Apply to affected itchy skin qd-bid prn, Avoid applying to face, groin, and axilla. Use as directed. Long-term use can cause thinning of the skin. Patient not taking: Reported on 09/05/2022 05/08/22   Brendolyn Patty, MD  omeprazole (PRILOSEC) 20 MG capsule Take 1 capsule (20 mg total) by mouth daily. 01/06/23   Montine Circle, PA-C  ondansetron (ZOFRAN) 4 MG tablet Take 1 tablet (4 mg total) by mouth every 8 (eight) hours as needed for nausea or vomiting (DURING MENSEES). Patient not taking: Reported on 12/13/2022 11/03/22   Theressa Stamps R, PA  ondansetron (ZOFRAN) 4 MG tablet Take 1 tablet (4 mg total) by mouth every 6 (six) hours. 01/05/23   Valarie Merino, MD  sucralfate (CARAFATE) 1 g tablet Take 1 tablet (1 g total) by mouth 4 (four) times daily -  with meals and at bedtime. 01/06/23   Montine Circle, PA-C      Allergies    Patient has no known allergies.  Review of Systems   Review of Systems  Cardiovascular:  Positive for syncope.    Physical Exam Updated Vital Signs BP 106/64   Pulse 70   Temp 98.3 F (36.8 C) (Oral)   Resp 20   Ht '5\' 4"'$  (1.626 m)   Wt 52 kg   LMP 01/29/2023 (Exact Date)   SpO2 100%   BMI 19.68 kg/m  Physical Exam Vitals and nursing note reviewed.  Constitutional:      General: She is not in acute distress.    Appearance: Normal appearance.  HENT:     Head: Normocephalic and atraumatic.     Nose: Nose normal.     Mouth/Throat:     Mouth: Mucous membranes are moist.     Pharynx: Oropharynx is  clear.  Eyes:     Extraocular Movements: Extraocular movements intact.     Conjunctiva/sclera: Conjunctivae normal.     Pupils: Pupils are equal, round, and reactive to light.  Cardiovascular:     Rate and Rhythm: Normal rate and regular rhythm.     Pulses: Normal pulses.     Heart sounds: Normal heart sounds.  Pulmonary:     Effort: Pulmonary effort is normal.     Breath sounds: Normal breath sounds.  Abdominal:     General: Abdomen is flat.     Palpations: Abdomen is soft.     Tenderness: There is abdominal tenderness (Suprapubic). There is no right CVA tenderness, left CVA tenderness, guarding or rebound.  Musculoskeletal:        General: Normal range of motion.     Cervical back: Normal range of motion and neck supple.     Right lower leg: No edema.     Left lower leg: No edema.  Skin:    General: Skin is warm and dry.  Neurological:     General: No focal deficit present.     Mental Status: She is alert and oriented to person, place, and time.     Cranial Nerves: No cranial nerve deficit.     Sensory: No sensory deficit.     Motor: No weakness.  Psychiatric:        Mood and Affect: Mood normal.        Behavior: Behavior normal.     ED Results / Procedures / Treatments   Labs (all labs ordered are listed, but only abnormal results are displayed) Labs Reviewed  COMPREHENSIVE METABOLIC PANEL - Abnormal; Notable for the following components:      Result Value   Glucose, Bld 104 (*)    Calcium 8.6 (*)    Anion gap 3 (*)    All other components within normal limits  URINALYSIS, W/ REFLEX TO CULTURE (INFECTION SUSPECTED) - Abnormal; Notable for the following components:   APPearance CLOUDY (*)    Hgb urine dipstick LARGE (*)    Protein, ur 30 (*)    Leukocytes,Ua TRACE (*)    Bacteria, UA RARE (*)    All other components within normal limits  CBC WITH DIFFERENTIAL/PLATELET  LIPASE, BLOOD  PREGNANCY, URINE    EKG EKG Interpretation  Date/Time:  Wednesday January 31 2023 19:51:45 EST Ventricular Rate:  90 PR Interval:  101 QRS Duration: 85 QT Interval:  350 QTC Calculation: 429 R Axis:   72 Text Interpretation: Sinus rhythm Short PR interval RSR' in V1 or V2, right VCD or RVH No significant change since last tracing Confirmed by Leanord Asal ([id number]) on 01/31/2023 8:02:00 PM  Radiology CT  Head Wo Contrast  Result Date: 01/31/2023 CLINICAL DATA:  Head trauma. EXAM: CT HEAD WITHOUT CONTRAST TECHNIQUE: Contiguous axial images were obtained from the base of the skull through the vertex without intravenous contrast. RADIATION DOSE REDUCTION: This exam was performed according to the departmental dose-optimization program which includes automated exposure control, adjustment of the mA and/or kV according to patient size and/or use of iterative reconstruction technique. COMPARISON:  Head CT dated 07/29/2022. FINDINGS: Brain: The ventricles and sulci are appropriate size for the patient's age. The gray-white matter discrimination is preserved. There is no acute intracranial hemorrhage. No mass effect or midline shift. No extra-axial fluid collection. Vascular: No hyperdense vessel or unexpected calcification. Skull: Normal. Negative for fracture or focal lesion. Sinuses/Orbits: The visualized paranasal sinuses and mastoid air cells are clear. Other: None IMPRESSION: Unremarkable noncontrast CT of the brain. Electronically Signed   By: Anner Crete M.D.   On: 01/31/2023 20:51    Procedures Procedures    Medications Ordered in ED Medications  metoCLOPramide (REGLAN) injection 10 mg (10 mg Intravenous Given 01/31/23 2043)  acetaminophen (TYLENOL) tablet 1,000 mg (1,000 mg Oral Given 01/31/23 2043)  lactated ringers bolus 1,000 mL (0 mLs Intravenous Stopped 01/31/23 2210)    ED Course/ Medical Decision Making/ A&P Clinical Course as of 01/31/23 2210  Wed Jan 31, 2023  2143 Rare bacteria and trace leuks in urine, no significant urinary symptoms making UTI  unlikely. [VK]  2204 Upon reassessment, the patient is asleep resting in bed comfortably in no acute distress.  She reports improvement of her symptoms and her workup is within normal range.  She is stable for discharge home and will be given outpatient GYN follow-up. [VK]    Clinical Course User Index [VK] Kemper Durie, DO                             Medical Decision Making This patient presents to the ED with chief complaint(s) of syncope with no pertinent past medical history which further complicates the presenting complaint. The complaint involves an extensive differential diagnosis and also carries with it a high risk of complications and morbidity.    The differential diagnosis includes arrhythmia, vasovagal syncope, orthostatic syncope, anemia, electrolyte abnormality, pregnancy, UTI, dehydration, patient has no focal neurologic deficits and no repetitive vomiting making ICH or mass effect less likely  Additional history obtained: Additional history obtained from N/A Records reviewed prior ED records  ED Course and Reassessment: Patient was initially evaluated by provider in triage and had EKG, head CT and labs ordered.  EKG on arrival had no acute ischemic changes.  Patient will additionally be given fluids, Tylenol and Reglan for symptomatic control and will be closely reassessed.  Independent labs interpretation:  The following labs were independently interpreted: within normal range  Independent visualization of imaging: - I independently visualized the following imaging with scope of interpretation limited to determining acute life threatening conditions related to emergency care: CTH, which revealed no acute disease  Consultation: - Consulted or discussed management/test interpretation w/ external professional: N/A  Consideration for admission or further workup: Patient has no emergent conditions requiring admission or further work-up at this time and is stable for  discharge home with primary care follow-up  Social Determinants of health: N/A    Risk OTC drugs. Prescription drug management.          Final Clinical Impression(s) / ED Diagnoses Final diagnoses:  Syncope, unspecified syncope type  Dysmenorrhea    Rx / DC Orders ED Discharge Orders     None         Kemper Durie, DO 01/31/23 2211

## 2023-02-01 ENCOUNTER — Telehealth: Payer: Self-pay

## 2023-02-01 NOTE — Transitions of Care (Post Inpatient/ED Visit) (Signed)
   02/01/2023  Name: Bethany Harris MRN: [id number] DOB: February 21, 2001  Today's TOC FU Call Status: Today's TOC FU Call Status:: Successful TOC FU Call Competed TOC FU Call Complete Date: 02/01/23  Transition Care Management Follow-up Telephone Call Date of Discharge: 01/31/23 Discharge Facility: [facility]) Type of Discharge: Emergency Department Reason for ED Visit: Other: (fainted) How have you been since you were released from the hospital?: Better Any questions or concerns?: No  Items Reviewed: Did you receive and understand the discharge instructions provided?: Yes Medications obtained and verified?: Yes (Medications Reviewed) Any new allergies since your discharge?: No Dietary orders reviewed?: NA Do you have support at home?: Yes People in Home: other relative(s)  Home Care and Equipment/Supplies: [place] Ordered?: NA Any new equipment or medical supplies ordered?: NA  Functional Questionnaire: Do you need assistance with bathing/showering or dressing?: No Do you need assistance with meal preparation?: No Do you need assistance with eating?: No Do you have difficulty maintaining continence: No Do you need assistance with getting out of bed/getting out of a chair/moving?: No Do you have difficulty managing or taking your medications?: No  Folllow up appointments reviewed: PCP Follow-up appointment confirmed?: Yes Date of PCP follow-up appointment?: 02/07/23 Follow-up Provider: Kenney Houseman Do you need transportation to your follow-up appointment?: No Do you understand care options if your condition(s) worsen?: Yes-patient verbalized understanding  SDOH Interventions Today    Flowsheet Row Most Recent Value  SDOH Interventions   Food Insecurity Interventions Intervention Not Indicated  Housing Interventions Intervention Not Indicated  Alcohol Usage Interventions Intervention Not Indicated (Score <7)  Financial Strain Interventions Intervention Not  Indicated  Physical Activity Interventions Intervention Not Indicated  Stress Interventions Intervention Not Indicated  Social Connections Interventions Intervention Not Indicated       [place] RMA

## 2023-02-07 ENCOUNTER — Ambulatory Visit (INDEPENDENT_AMBULATORY_CARE_PROVIDER_SITE_OTHER): Payer: Medicaid Other | Admitting: Nurse Practitioner

## 2023-02-07 ENCOUNTER — Encounter: Payer: Self-pay | Admitting: Nurse Practitioner

## 2023-02-07 VITALS — BP 97/61 | HR 95 | Temp 97.9°F | Wt 114.6 lb

## 2023-02-07 DIAGNOSIS — R5383 Other fatigue: Secondary | ICD-10-CM

## 2023-02-07 DIAGNOSIS — G8929 Other chronic pain: Secondary | ICD-10-CM

## 2023-02-07 DIAGNOSIS — R630 Anorexia: Secondary | ICD-10-CM

## 2023-02-07 DIAGNOSIS — K219 Gastro-esophageal reflux disease without esophagitis: Secondary | ICD-10-CM

## 2023-02-07 DIAGNOSIS — R109 Unspecified abdominal pain: Secondary | ICD-10-CM | POA: Diagnosis not present

## 2023-02-07 DIAGNOSIS — N926 Irregular menstruation, unspecified: Secondary | ICD-10-CM

## 2023-02-07 HISTORY — DX: Gastro-esophageal reflux disease without esophagitis: K21.9

## 2023-02-07 MED ORDER — MIRTAZAPINE 7.5 MG PO TABS: 7.5000 mg | ORAL_TABLET | Freq: Every day | ORAL | 0 refills | Status: DC

## 2023-02-07 MED ORDER — OMEPRAZOLE 20 MG PO CPDR: 20.0000 mg | DELAYED_RELEASE_CAPSULE | Freq: Every day | ORAL | 0 refills | Status: DC

## 2023-02-07 NOTE — Assessment & Plan Note (Signed)
-   omeprazole (PRILOSEC) 20 MG capsule; Take 1 capsule (20 mg total) by mouth daily.  Dispense: 30 capsule; Refill: 0   2. Other fatigue  - CBC - Comprehensive metabolic panel - Thyroid Panel With TSH - Vitamin D, 25-hydroxy - Vitamin B12   Follow up:  Follow up in 3 months

## 2023-02-07 NOTE — Progress Notes (Signed)
$'@Patient't$  ID: Bethany Harris, female    DOB: [birth year], 22 y.o.   MRN: [id number]  Chief Complaint  Patient presents with   Loss of Consciousness    Happens when she gets her period     Referring provider: Fenton Foy, NP   HPI  Patient presents today for urgent care follow-up.  Seen in the urgent care after syncopal episode.  She states that this happens often with her menstrual cycle.  She states that she has severe cramping and passes out.  Patient has not been eating or drinking well.  She is fasting for religious purposes currently.  She states even when she is not fasting she has no appetite.  She states when she eats she does have abdominal pain.  We will place a referral to GI and to OB/GYN today.  For these issues. Denies f/c/s, n/v/d, hemoptysis, PND, leg swelling Denies chest pain or edema     No Known Allergies  Immunization History  Administered Date(s) Administered   Influenza,inj,Quad PF,6+ Mos 12/13/2022    Past Medical History:  Diagnosis Date   GERD (gastroesophageal reflux disease)     Tobacco History: Social History   Tobacco Use  Smoking Status Never   Passive exposure: Never  Smokeless Tobacco Never   Counseling given: Not Answered   Outpatient Encounter Medications as of 02/07/2023  Medication Sig   gabapentin (NEURONTIN) 100 MG capsule TAKE 1 CAPSULE(100 MG) BY MOUTH THREE TIMES DAILY   mirtazapine (REMERON) 7.5 MG tablet Take 1 tablet (7.5 mg total) by mouth at bedtime.   cyclobenzaprine (FLEXERIL) 10 MG tablet Take 0.5-1 tablets (5-10 mg total) by mouth 2 (two) times daily as needed for muscle spasms. (Patient not taking: Reported on 10/26/2022)   diazepam (VALIUM) 5 MG tablet Please take 1.5 hours prior to procedure. May repeat x 1. (Patient not taking: Reported on 02/01/2023)   dicyclomine (BENTYL) 10 MG capsule Take 1 capsule (10 mg total) by mouth 4 (four) times daily -  before meals and at bedtime. (Patient not taking: Reported on  09/05/2022)   HYDROcodone-acetaminophen (NORCO/VICODIN) 5-325 MG tablet Take 1 tablet by mouth every 8 (eight) hours as needed. (Patient not taking: Reported on 10/26/2022)   ibuprofen (ADVIL) 600 MG tablet Take 1 tablet (600 mg total) by mouth every 6 (six) hours as needed. (Patient not taking: Reported on 12/13/2022)   ketoconazole (NIZORAL) 2 % shampoo apply two times per week, massage into scalp and leave in for 10 minutes before rinsing out (Patient not taking: Reported on 09/05/2022)   mometasone (ELOCON) 0.1 % cream Apply to affected itchy skin qd-bid prn, Avoid applying to face, groin, and axilla. Use as directed. Long-term use can cause thinning of the skin. (Patient not taking: Reported on 09/05/2022)   omeprazole (PRILOSEC) 20 MG capsule Take 1 capsule (20 mg total) by mouth daily.   ondansetron (ZOFRAN) 4 MG tablet Take 1 tablet (4 mg total) by mouth every 8 (eight) hours as needed for nausea or vomiting (DURING MENSEES). (Patient not taking: Reported on 12/13/2022)   ondansetron (ZOFRAN) 4 MG tablet Take 1 tablet (4 mg total) by mouth every 6 (six) hours. (Patient not taking: Reported on 02/01/2023)   sucralfate (CARAFATE) 1 g tablet Take 1 tablet (1 g total) by mouth 4 (four) times daily -  with meals and at bedtime. (Patient not taking: Reported on 02/01/2023)   [DISCONTINUED] omeprazole (PRILOSEC) 20 MG capsule Take 1 capsule (20 mg total) by mouth daily. (Patient not  taking: Reported on 02/01/2023)   No facility-administered encounter medications on file as of 02/07/2023.     Review of Systems  Review of Systems  Constitutional: Negative.   HENT: Negative.    Cardiovascular: Negative.   Gastrointestinal: Negative.   Allergic/Immunologic: Negative.   Neurological: Negative.   Psychiatric/Behavioral: Negative.         Physical Exam  BP 97/61   Pulse 95   Temp 97.9 F (36.6 C)   Wt 114 lb 9.6 oz (52 kg)   LMP 01/29/2023 (Exact Date)   SpO2 100%   BMI 19.67 kg/m   Wt  Readings from Last 5 Encounters:  02/07/23 114 lb 9.6 oz (52 kg)  01/31/23 114 lb 10.2 oz (52 kg)  01/18/23 114 lb (51.7 kg)  01/06/23 114 lb 10.2 oz (52 kg)  12/19/22 116 lb (52.6 kg)     Physical Exam Vitals and nursing note reviewed.  Constitutional:      General: She is not in acute distress.    Appearance: She is well-developed.  Cardiovascular:     Rate and Rhythm: Normal rate and regular rhythm.  Pulmonary:     Effort: Pulmonary effort is normal.     Breath sounds: Normal breath sounds.  Neurological:     Mental Status: She is alert and oriented to person, place, and time.      Lab Results:  CBC    Component Value Date/Time   WBC 8.4 01/31/2023 2000   RBC 4.55 01/31/2023 2000   HGB 12.9 01/31/2023 2000   HGB 12.8 06/01/2022 1042   HCT 38.9 01/31/2023 2000   HCT 38.3 06/01/2022 1042   PLT 178 01/31/2023 2000   PLT 169 06/01/2022 1042   MCV 85.5 01/31/2023 2000   MCV 86 06/01/2022 1042   MCH 28.4 01/31/2023 2000   MCHC 33.2 01/31/2023 2000   RDW 12.1 01/31/2023 2000   RDW 11.9 06/01/2022 1042   LYMPHSABS 2.1 01/31/2023 2000   MONOABS 0.6 01/31/2023 2000   EOSABS 0.1 01/31/2023 2000   BASOSABS 0.0 01/31/2023 2000    BMET    Component Value Date/Time   NA 137 01/31/2023 2000   K 3.7 01/31/2023 2000   CL 111 01/31/2023 2000   CO2 23 01/31/2023 2000   GLUCOSE 104 (H) 01/31/2023 2000   BUN 12 01/31/2023 2000   CREATININE 0.63 01/31/2023 2000   CALCIUM 8.6 (L) 01/31/2023 2000   GFRNONAA >60 01/31/2023 2000    BNP No results found for: "BNP"  ProBNP No results found for: "PROBNP"  Imaging: CT Head Wo Contrast  Result Date: 01/31/2023 CLINICAL DATA:  Head trauma. EXAM: CT HEAD WITHOUT CONTRAST TECHNIQUE: Contiguous axial images were obtained from the base of the skull through the vertex without intravenous contrast. RADIATION DOSE REDUCTION: This exam was performed according to the departmental dose-optimization program which includes automated  exposure control, adjustment of the mA and/or kV according to patient size and/or use of iterative reconstruction technique. COMPARISON:  Head CT dated 07/29/2022. FINDINGS: Brain: The ventricles and sulci are appropriate size for the patient's age. The gray-white matter discrimination is preserved. There is no acute intracranial hemorrhage. No mass effect or midline shift. No extra-axial fluid collection. Vascular: No hyperdense vessel or unexpected calcification. Skull: Normal. Negative for fracture or focal lesion. Sinuses/Orbits: The visualized paranasal sinuses and mastoid air cells are clear. Other: None IMPRESSION: Unremarkable noncontrast CT of the brain. Electronically Signed   By: Anner Crete M.D.   On: 01/31/2023 20:51  Assessment & Plan:   Gastroesophageal reflux disease - omeprazole (PRILOSEC) 20 MG capsule; Take 1 capsule (20 mg total) by mouth daily.  Dispense: 30 capsule; Refill: 0   2. Other fatigue  - CBC - Comprehensive metabolic panel - Thyroid Panel With TSH - Vitamin D, 25-hydroxy - Vitamin B12   Follow up:  Follow up in 3 months      Fenton Foy, NP 02/07/2023

## 2023-02-07 NOTE — Patient Instructions (Signed)
1. Gastroesophageal reflux disease, unspecified whether esophagitis present  - omeprazole (PRILOSEC) 20 MG capsule; Take 1 capsule (20 mg total) by mouth daily.  Dispense: 30 capsule; Refill: 0   2. Other fatigue  - CBC - Comprehensive metabolic panel - Thyroid Panel With TSH - Vitamin D, 25-hydroxy - Vitamin B12   Follow up:  Follow up in 3 months

## 2023-02-08 ENCOUNTER — Other Ambulatory Visit: Payer: Self-pay | Admitting: Nurse Practitioner

## 2023-02-08 LAB — COMPREHENSIVE METABOLIC PANEL
ALT: 11 IU/L (ref 0–32)
AST: 12 IU/L (ref 0–40)
Albumin/Globulin Ratio: 2.3 — ABNORMAL HIGH (ref 1.2–2.2)
Albumin: 4.8 g/dL (ref 4.0–5.0)
Alkaline Phosphatase: 81 IU/L (ref 44–121)
BUN/Creatinine Ratio: 15 (ref 9–23)
BUN: 12 mg/dL (ref 6–20)
Bilirubin Total: 0.6 mg/dL (ref 0.0–1.2)
CO2: 22 mmol/L (ref 20–29)
Calcium: 9.6 mg/dL (ref 8.7–10.2)
Chloride: 105 mmol/L (ref 96–106)
Creatinine, Ser: 0.79 mg/dL (ref 0.57–1.00)
Globulin, Total: 2.1 g/dL (ref 1.5–4.5)
Glucose: 90 mg/dL (ref 70–99)
Potassium: 4 mmol/L (ref 3.5–5.2)
Sodium: 141 mmol/L (ref 134–144)
Total Protein: 6.9 g/dL (ref 6.0–8.5)
eGFR: 109 mL/min/{1.73_m2} (ref >59)

## 2023-02-08 LAB — CBC
Hematocrit: 40.1 % (ref 34.0–46.6)
Hemoglobin: 13 g/dL (ref 11.1–15.9)
MCH: 28.1 pg (ref 26.6–33.0)
MCHC: 32.4 g/dL (ref 31.5–35.7)
MCV: 87 fL (ref 79–97)
Platelets: 184 10*3/uL (ref 150–450)
RBC: 4.62 x10E6/uL (ref 3.77–5.28)
RDW: 12.7 % (ref 11.7–15.4)
WBC: 6.8 10*3/uL (ref 3.4–10.8)

## 2023-02-08 LAB — THYROID PANEL WITH TSH
Free Thyroxine Index: 2.4 (ref 1.2–4.9)
T3 Uptake Ratio: 30 % (ref 24–39)
T4, Total: 8 ug/dL (ref 4.5–12.0)
TSH: 1.14 u[IU]/mL (ref 0.450–4.500)

## 2023-02-08 LAB — VITAMIN B12: Vitamin B-12: 181 pg/mL — ABNORMAL LOW (ref 232–1245)

## 2023-02-08 LAB — VITAMIN D 25 HYDROXY (VIT D DEFICIENCY, FRACTURES): Vit D, 25-Hydroxy: 9.5 ng/mL — ABNORMAL LOW (ref 30.0–100.0)

## 2023-02-08 MED ORDER — VITAMIN B-12 1000 MCG PO TABS: 1000.0000 ug | ORAL_TABLET | Freq: Every day | ORAL | 2 refills | Status: DC

## 2023-02-08 MED ORDER — VITAMIN D (ERGOCALCIFEROL) 1.25 MG (50000 UNIT) PO CAPS: 50000.0000 [IU] | ORAL_CAPSULE | ORAL | 2 refills | Status: DC

## 2023-02-23 ENCOUNTER — Other Ambulatory Visit: Payer: Self-pay | Admitting: Family Medicine

## 2023-03-01 ENCOUNTER — Encounter: Payer: Self-pay | Admitting: Family Medicine

## 2023-03-01 ENCOUNTER — Ambulatory Visit (INDEPENDENT_AMBULATORY_CARE_PROVIDER_SITE_OTHER): Payer: Medicaid Other | Admitting: Family Medicine

## 2023-03-01 VITALS — BP 92/68 | Ht 64.0 in | Wt 114.0 lb

## 2023-03-01 DIAGNOSIS — M5416 Radiculopathy, lumbar region: Secondary | ICD-10-CM | POA: Diagnosis not present

## 2023-03-01 NOTE — Assessment & Plan Note (Signed)
No significant worsening of her pain.  Does get improvement with the gabapentin.  He is not able to get back into physical therapy. -Counseled on home exercise therapy and supportive care. - referral for physical therapy.

## 2023-03-01 NOTE — Progress Notes (Signed)
  Josefina Pleger - 22 y.o. female MRN [id number]  Date of birth: April 30, 2001  SUBJECTIVE:  Including CC & ROS.  No chief complaint on file.   Carlyn Deshler is a 22 y.o. female that is following up for her radicular back pain.  The pain is maintaining.  It is not worsened.  She does get improvement with the gabapentin.  Has not been able to restart physical therapy.    Review of Systems See HPI   HISTORY: Past Medical, Surgical, Social, and Family History Reviewed & Updated per EMR.   Pertinent Historical Findings include:  Past Medical History:  Diagnosis Date   GERD (gastroesophageal reflux disease)     History reviewed. No pertinent surgical history.   PHYSICAL EXAM:  VS: BP 92/68 (BP Location: Left Arm, Patient Position: Sitting)   Ht 5\' 4"  (1.626 m)   Wt 114 lb (51.7 kg)   LMP 01/29/2023 (Exact Date)   BMI 19.57 kg/m  Physical Exam Gen: NAD, alert, cooperative with exam, well-appearing MSK:  Neurovascularly intact       ASSESSMENT & PLAN:   Lumbar radiculopathy No significant worsening of her pain.  Does get improvement with the gabapentin.  He is not able to get back into physical therapy. -Counseled on home exercise therapy and supportive care. - referral for physical therapy.

## 2023-03-07 ENCOUNTER — Other Ambulatory Visit: Payer: Self-pay | Admitting: Nurse Practitioner

## 2023-03-07 DIAGNOSIS — K219 Gastro-esophageal reflux disease without esophagitis: Secondary | ICD-10-CM

## 2023-03-07 DIAGNOSIS — R630 Anorexia: Secondary | ICD-10-CM

## 2023-03-07 NOTE — Telephone Encounter (Signed)
Please advise KH 

## 2023-03-10 IMAGING — CT CT ABD-PELV W/ CM
2 of 4 series · 16 of 46 positions shown, 18 images · IV contrast (APPLIED)
Comparison: None.

CLINICAL DATA: Right lower quadrant abdominal pain.

EXAM:
CT ABDOMEN AND PELVIS WITH CONTRAST
TECHNIQUE: Multidetector CT imaging of the abdomen and pelvis was performed
using the standard protocol following bolus administration of
intravenous contrast.
CONTRAST:  100mL OMNIPAQUE IOHEXOL 300 MG/ML  SOLN

[Series 3: abdomen 5.0 · axial · 0.79mm/px · z∈[-539,-124]mm · 13 of 93 slices shown, 15 images]
[im 5/93  soft-tissue]
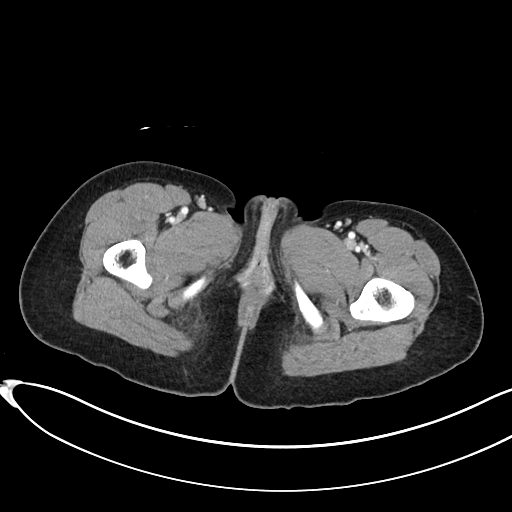
[im 5/93  bone]
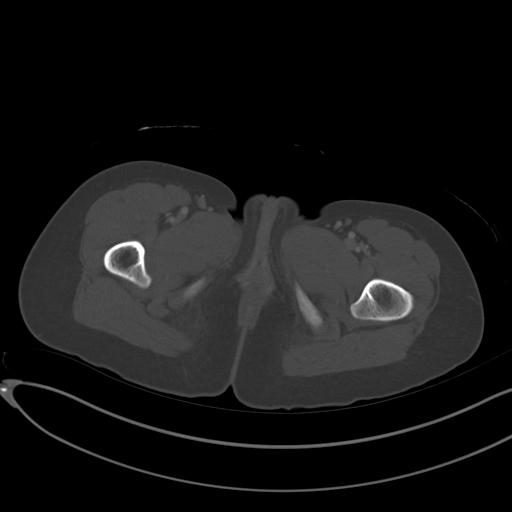
[im 14/93  soft-tissue]
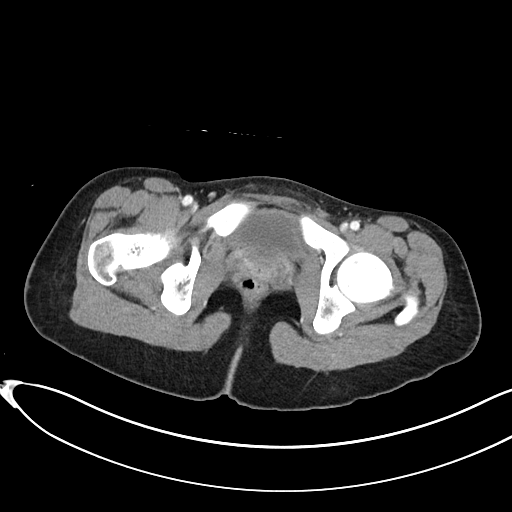
[im 18/93  soft-tissue]
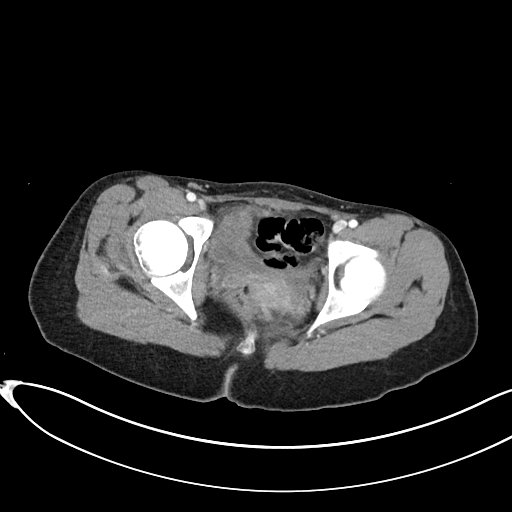
[im 27/93  soft-tissue]
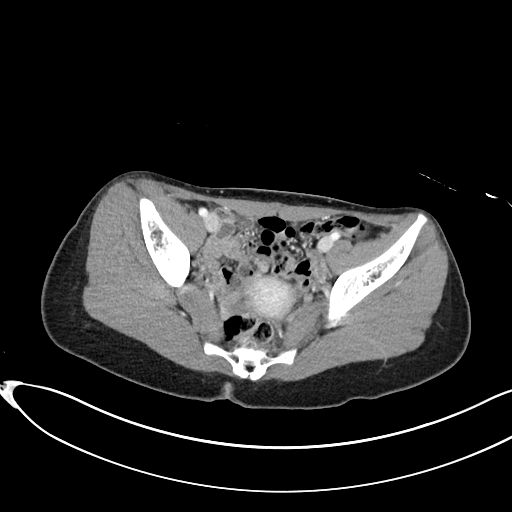
[im 31/93  soft-tissue]
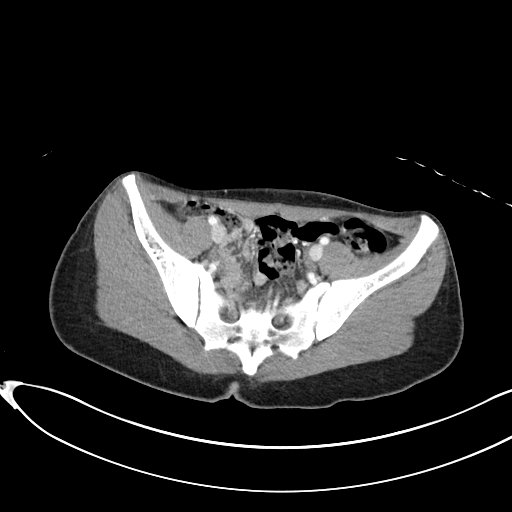
[im 40/93  soft-tissue]
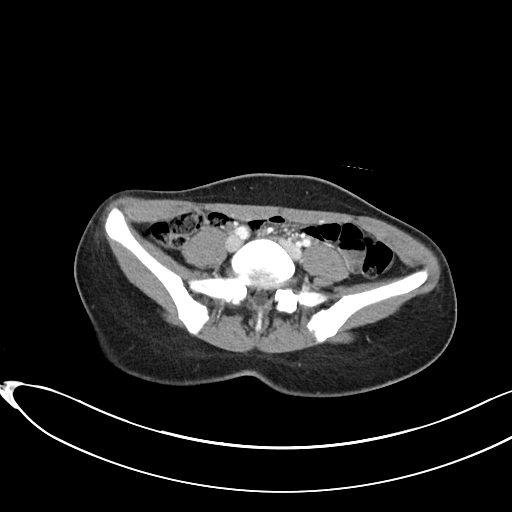
[im 49/93  soft-tissue]
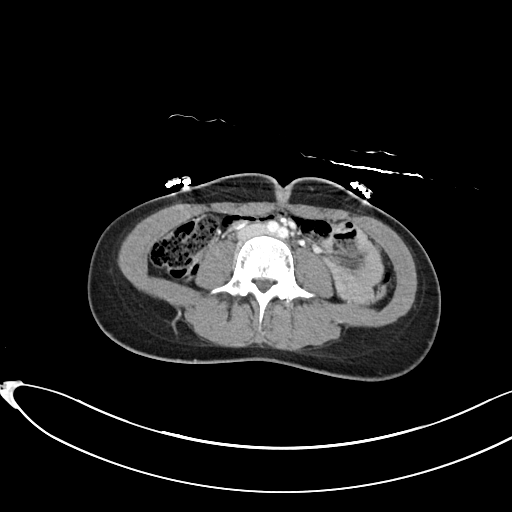
[im 53/93  soft-tissue]
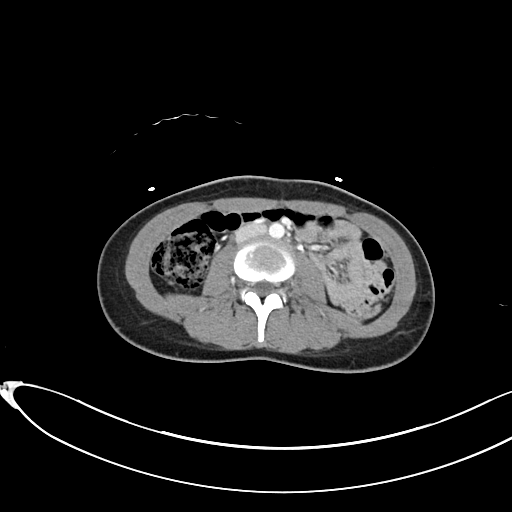
[im 62/93  soft-tissue]
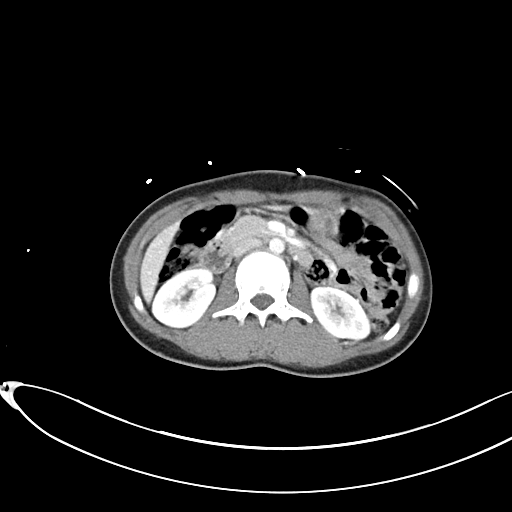
[im 62/93  bone]
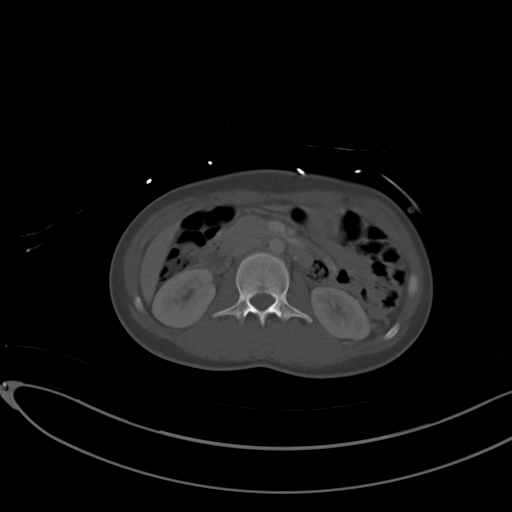
[im 66/93  soft-tissue]
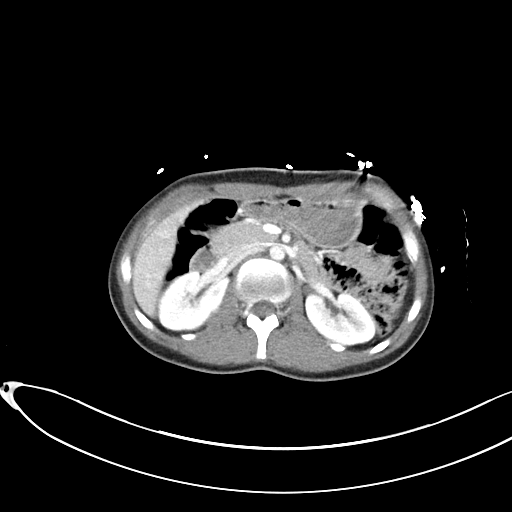
[im 75/93  soft-tissue]
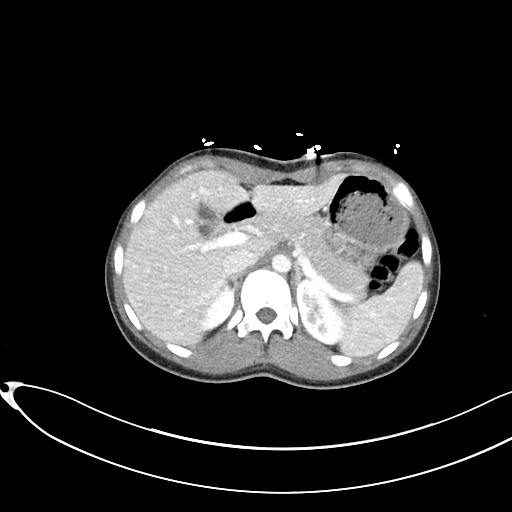
[im 79/93  soft-tissue]
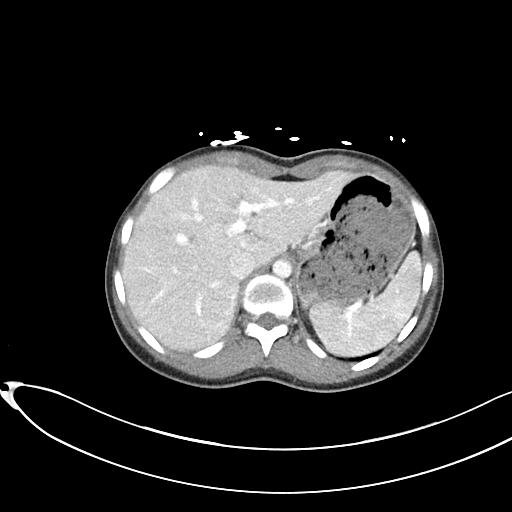
[im 88/93  soft-tissue]
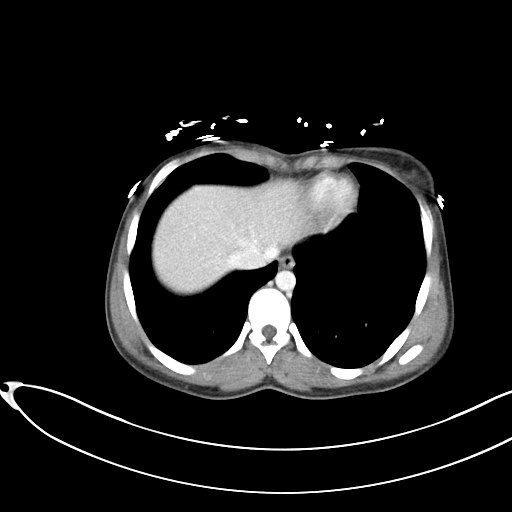

[Series 6: abdomen 3.0 mpr cor · coronal · 0.75mm/px · 3 of 76 slices shown]
[im 26/76  soft-tissue]
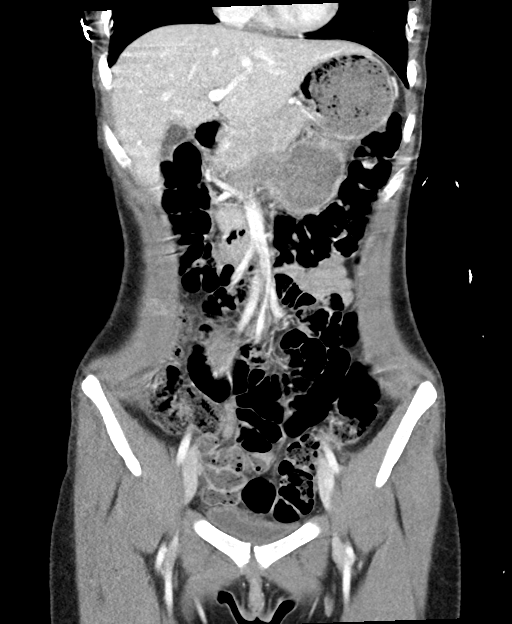
[im 34/76  soft-tissue]
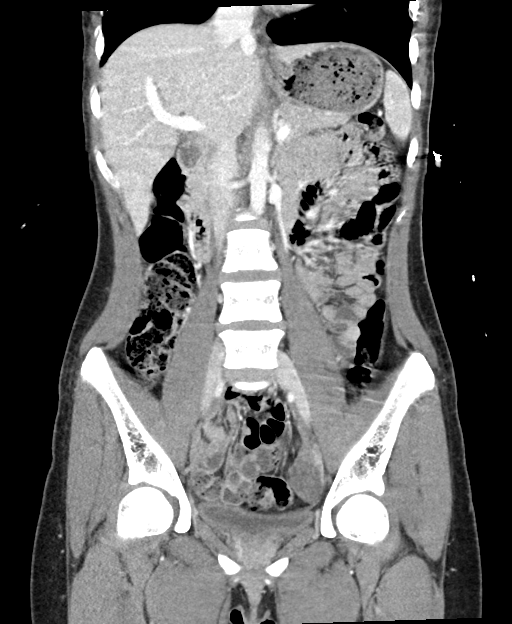
[im 42/76  soft-tissue]
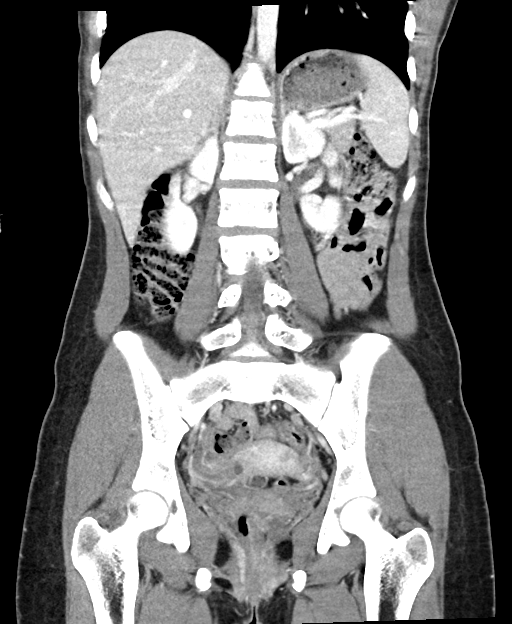

[16 of 46 positions shown; findings below may reference images not displayed]

FINDINGS: Evaluation of this exam is limited due to respiratory motion
artifact.

Lower chest: The visualized lung bases are clear.

No intra-abdominal free air or free fluid.

Hepatobiliary: No focal liver abnormality is seen. No gallstones,
gallbladder wall thickening, or biliary dilatation.

Pancreas: Unremarkable. No pancreatic ductal dilatation or
surrounding inflammatory changes.

Spleen: Normal in size without focal abnormality.

Adrenals/Urinary Tract: Adrenal glands are unremarkable. Kidneys are
normal, without renal calculi, focal lesion, or hydronephrosis.
Bladder is unremarkable.

Stomach/Bowel: No bowel obstruction or active inflammation. The
appendix is normal.

Vascular/Lymphatic: The abdominal aorta and IVC unremarkable. No
portal venous gas. There is no adenopathy.

Reproductive: The uterus is anteflexed. Small left ovarian corpus
luteum. The right ovary is unremarkable.

Other: None

Musculoskeletal: No acute or significant osseous findings.
IMPRESSION: No acute intra-abdominal or pelvic pathology. Normal appendix.

## 2023-03-10 IMAGING — US US PELVIS COMPLETE
1 series · 14 of 25 positions shown · non-contrast
Comparison: None.

CLINICAL DATA: Left lower quadrant abdominal pain

EXAM:
TRANSABDOMINAL ULTRASOUND OF PELVIS
DOPPLER ULTRASOUND OF OVARIES
TECHNIQUE: Transabdominal ultrasound examination of the pelvis was performed
including evaluation of the uterus, ovaries, adnexal regions, and
pelvic cul-de-sac.
Color and duplex Doppler ultrasound was utilized to evaluate blood
flow to the ovaries.

[Series 1: us pelvic complete w transvaginal and torsion righ · 14 of 27 slices shown]
[im 1/27]
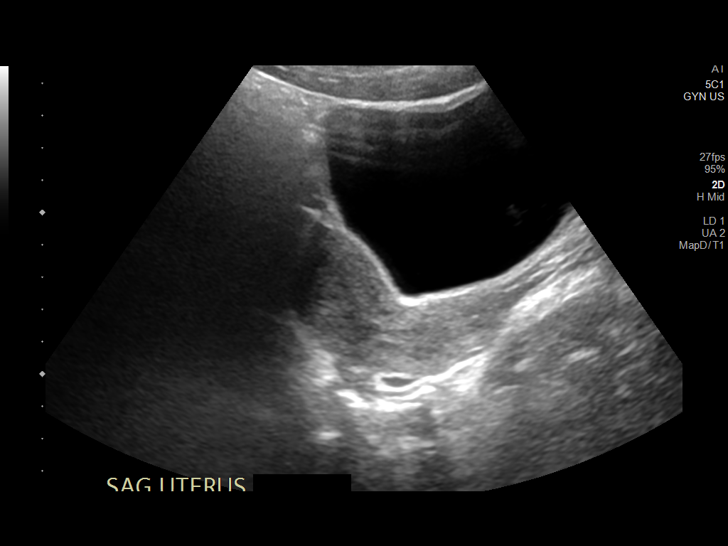
[im 3/27]
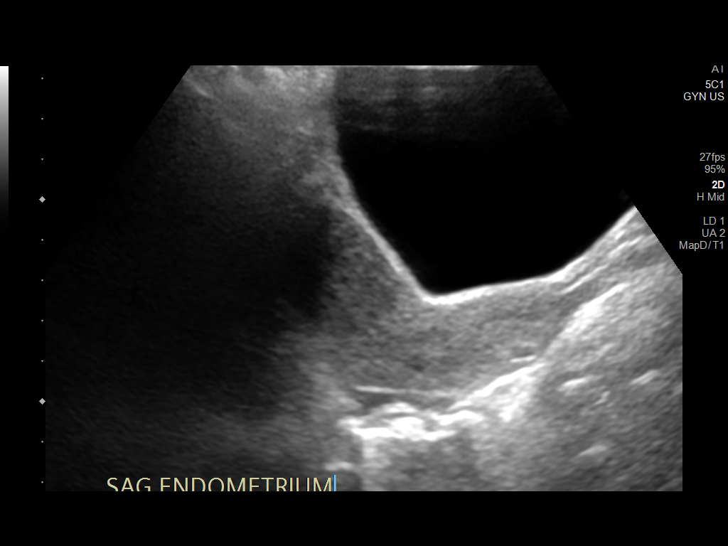
[im 5/27]
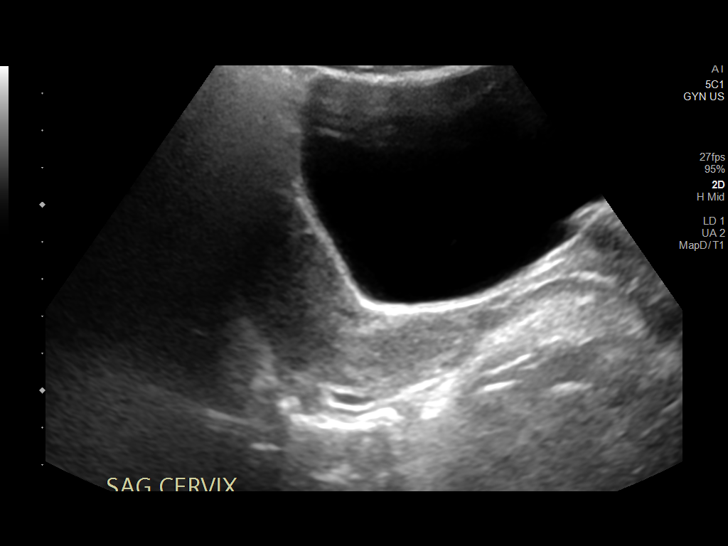
[im 7/27]
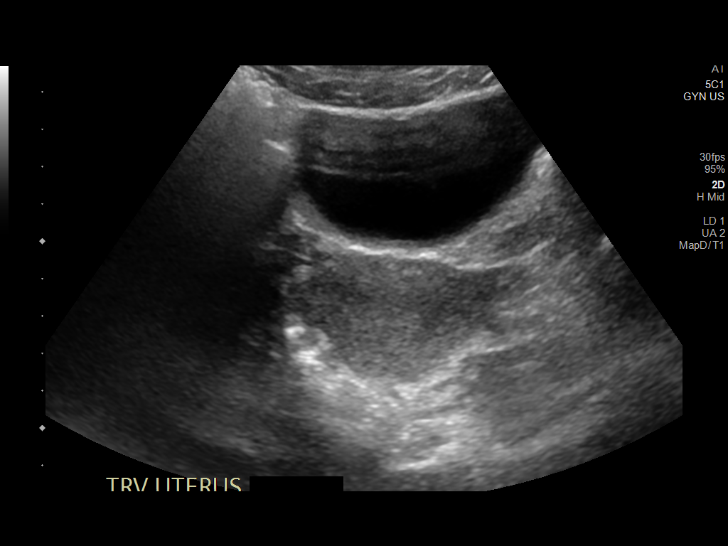
[im 9/27]
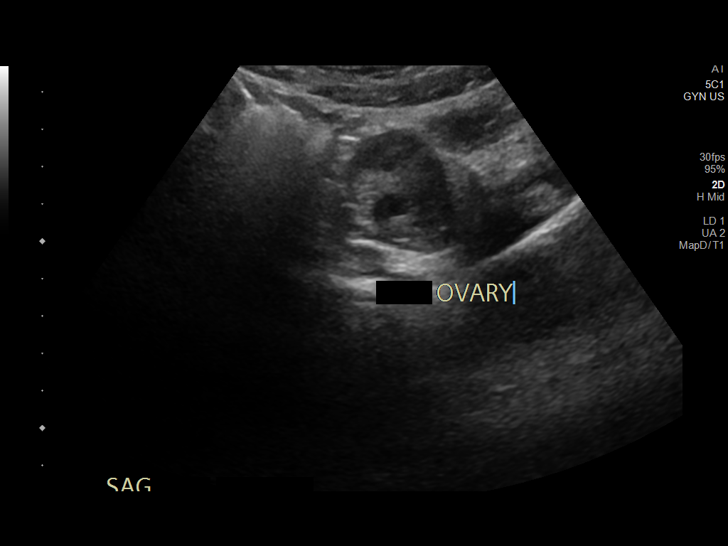
[im 10/27]
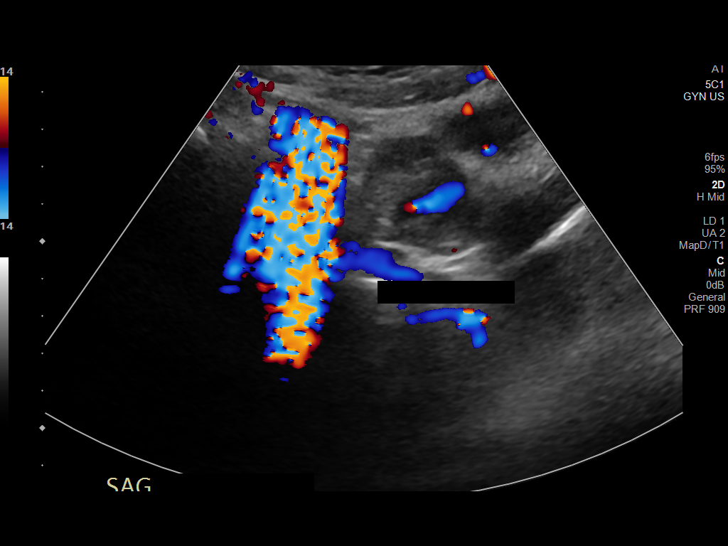
[im 12/27]
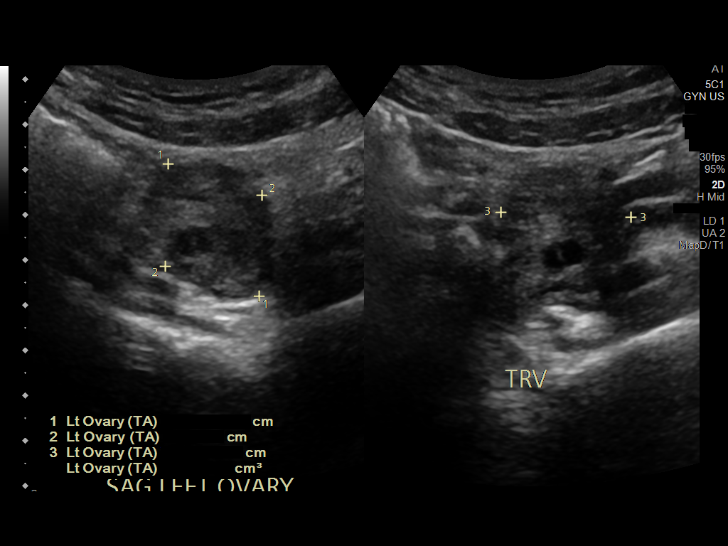
[im 15/27]
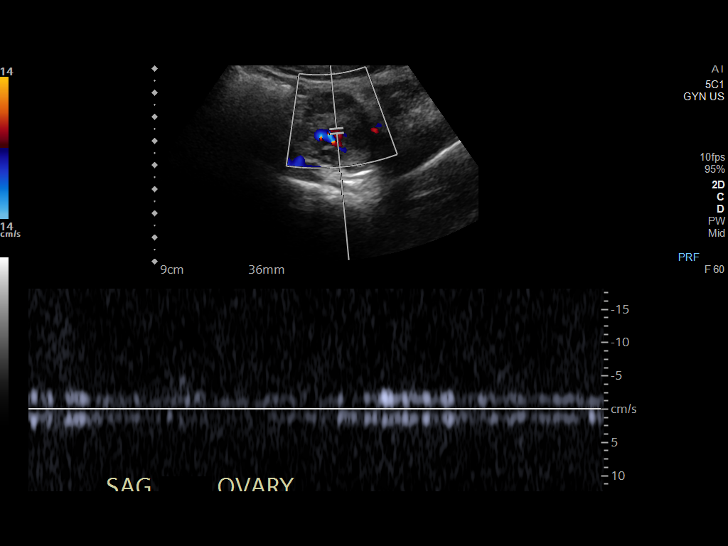
[im 17/27]
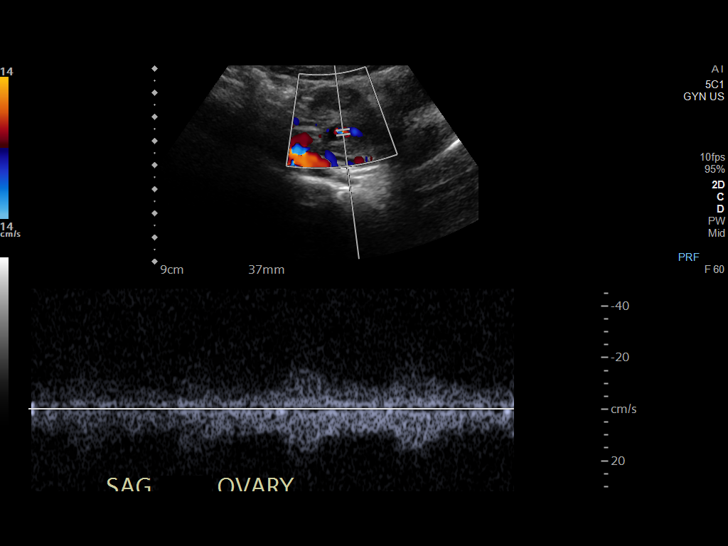
[im 18/27]
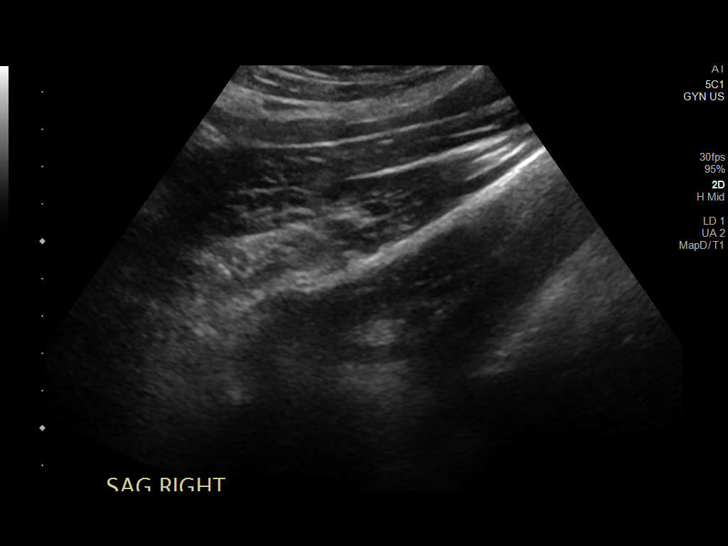
[im 20/27]
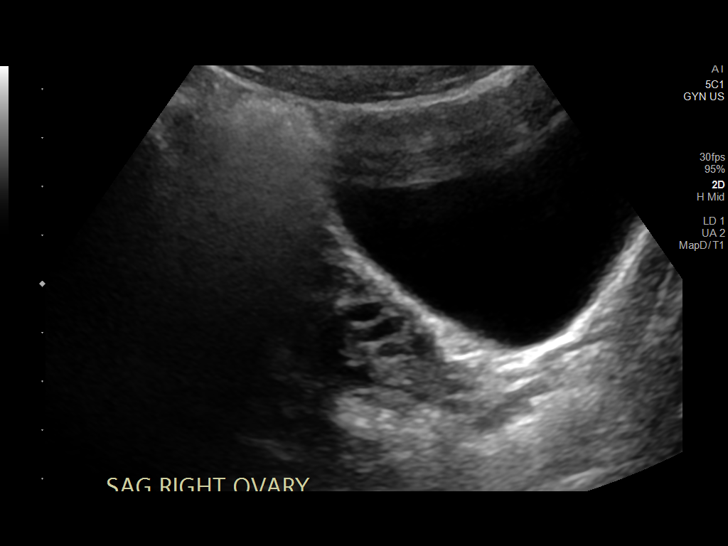
[im 22/27]
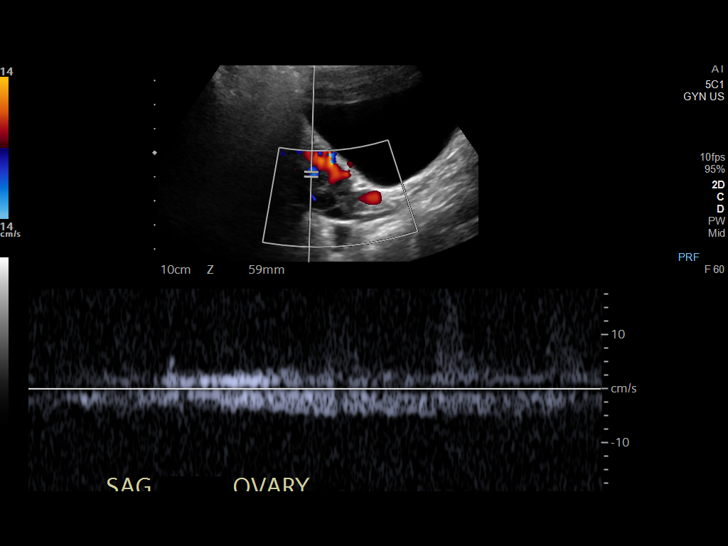
[im 24/27]
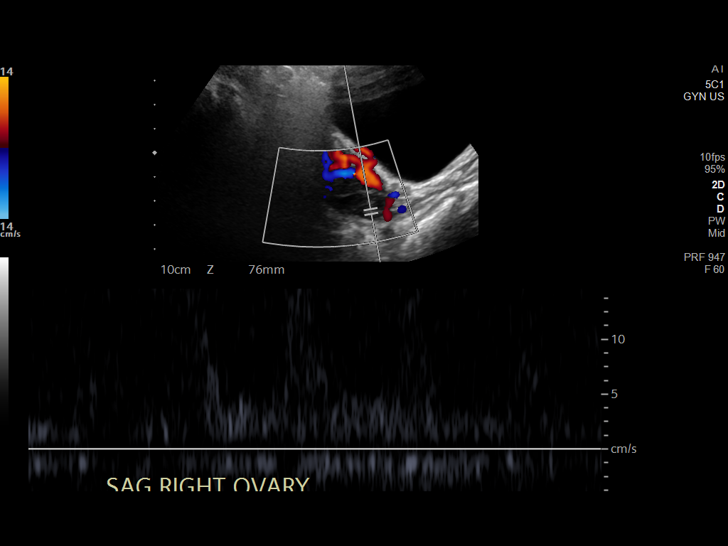
[im 27/27]
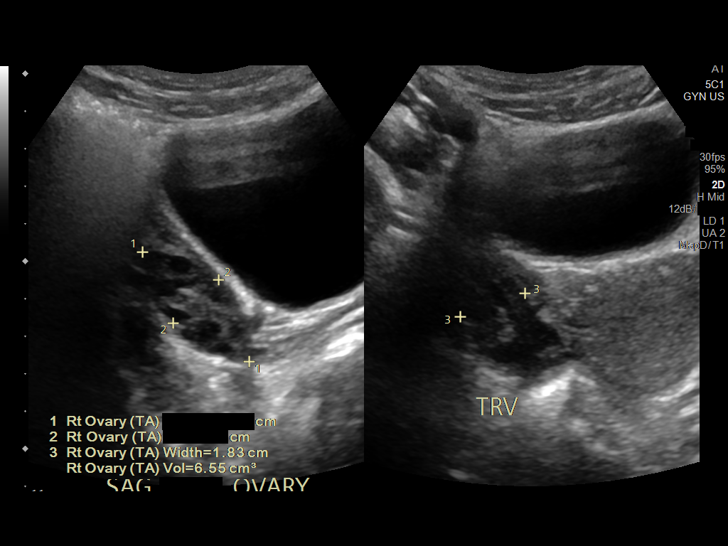

[14 of 25 positions shown; findings below may reference images not displayed]

FINDINGS: Uterus

Measurements: 6.7 x 2.9 x 5.4 cm = volume: 54.8 mL. No fibroids or
other mass visualized.

Endometrium

Thickness: 4 mm.  No focal abnormality visualized.

Right ovary

Measurements: 4.1 x 1.7 x 1.8 cm = volume: 6.6 mL. Normal
appearance/no adnexal mass.

Left ovary

Measurements: 3.6 x 2.7 x 2.9 cm = volume: 14.2 mL. Normal
appearance/no adnexal mass.

Pulsed Doppler evaluation demonstrates normal low-resistance
arterial and venous waveforms in both ovaries.

Other: No free fluid.

Patient refused endovaginal examination.
IMPRESSION: 1. Age-appropriate pelvic ultrasound.  No acute findings.

## 2023-03-12 ENCOUNTER — Encounter: Payer: Self-pay | Admitting: *Deleted

## 2023-03-21 ENCOUNTER — Ambulatory Visit: Payer: Medicaid Other | Admitting: Nurse Practitioner

## 2023-03-22 ENCOUNTER — Emergency Department (HOSPITAL_COMMUNITY)
Admission: EM | Admit: 2023-03-22 | Discharge: 2023-03-22 | Disposition: A | Discharge status: Home / Self Care | Payer: Medicaid Other | Attending: Emergency Medicine | Admitting: Emergency Medicine

## 2023-03-22 ENCOUNTER — Encounter (HOSPITAL_COMMUNITY): Payer: Self-pay

## 2023-03-22 ENCOUNTER — Other Ambulatory Visit: Payer: Self-pay

## 2023-03-22 ENCOUNTER — Emergency Department (HOSPITAL_COMMUNITY): Payer: Medicaid Other

## 2023-03-22 DIAGNOSIS — M79641 Pain in right hand: Secondary | ICD-10-CM | POA: Diagnosis not present

## 2023-03-22 LAB — CBC WITH DIFFERENTIAL/PLATELET
Abs Immature Granulocytes: 0.02 10*3/uL (ref 0.00–0.07)
Basophils Absolute: 0 10*3/uL (ref 0.0–0.1)
Basophils Relative: 0 %
Eosinophils Absolute: 0.1 10*3/uL (ref 0.0–0.5)
Eosinophils Relative: 1 %
HCT: 39.5 % (ref 36.0–46.0)
Hemoglobin: 13.3 g/dL (ref 12.0–15.0)
Immature Granulocytes: 0 %
Lymphocytes Relative: 33 %
Lymphs Abs: 3.2 10*3/uL (ref 0.7–4.0)
MCH: 28.8 pg (ref 26.0–34.0)
MCHC: 33.7 g/dL (ref 30.0–36.0)
MCV: 85.5 fL (ref 80.0–100.0)
Monocytes Absolute: 0.6 10*3/uL (ref 0.1–1.0)
Monocytes Relative: 6 %
Neutro Abs: 5.7 10*3/uL (ref 1.7–7.7)
Neutrophils Relative %: 60 %
Platelets: 178 10*3/uL (ref 150–400)
RBC: 4.62 MIL/uL (ref 3.87–5.11)
RDW: 12.2 % (ref 11.5–15.5)
WBC: 9.7 10*3/uL (ref 4.0–10.5)
nRBC: 0 % (ref 0.0–0.2)

## 2023-03-22 LAB — URIC ACID: Uric Acid, Serum: 3.9 mg/dL (ref 2.5–7.1)

## 2023-03-22 LAB — COMPREHENSIVE METABOLIC PANEL
ALT: 13 U/L (ref 0–44)
AST: 17 U/L (ref 15–41)
Albumin: 4.7 g/dL (ref 3.5–5.0)
Alkaline Phosphatase: 57 U/L (ref 38–126)
Anion gap: 9 (ref 5–15)
BUN: 12 mg/dL (ref 6–20)
CO2: 22 mmol/L (ref 22–32)
Calcium: 9.1 mg/dL (ref 8.9–10.3)
Chloride: 105 mmol/L (ref 98–111)
Creatinine, Ser: 0.73 mg/dL (ref 0.44–1.00)
GFR, Estimated: >60 mL/min (ref >60)
Glucose, Bld: 108 mg/dL — ABNORMAL HIGH (ref 70–99)
Potassium: 3.9 mmol/L (ref 3.5–5.1)
Sodium: 136 mmol/L (ref 135–145)
Total Bilirubin: 0.7 mg/dL (ref 0.3–1.2)
Total Protein: 7.5 g/dL (ref 6.5–8.1)

## 2023-03-22 MED ORDER — ACETAMINOPHEN 325 MG PO TABS
650.0000 mg | ORAL_TABLET | Freq: Once | ORAL | Status: AC
  Administered 2023-03-22: 650 mg via ORAL
  Filled 2023-03-22: qty 2

## 2023-03-22 NOTE — ED Notes (Signed)
Pt resting in bed with no acute distress noted at this time.  

## 2023-03-22 NOTE — Discharge Instructions (Addendum)
Today your labs are unremarkable, and there x-ray is reassuring.  It is unclear what is going on with your hand please follow-up with your primary care doctor.  I provided you with a brace for support.  There are no fractures on your x-ray, however can take up to 10 days for fracture to show up.  Return to the ER if you have any kind of severe worsening pain, worsening swelling of the hand, loss of function of it, or coolness of your extremity.

## 2023-03-22 NOTE — ED Provider Notes (Signed)
[place] EMERGENCY DEPARTMENT AT [facility] Provider Note   CSN: [id number] Arrival date & time: 03/22/23  1705     History  Chief Complaint  Patient presents with   Hand Pain    Abraham Margulies is a 22 y.o. female, who presents to the ED secondary to right hand pain, it has been going on for the last few days.  She states that she has not had any trauma to the area, but she noticed that her right thumb has been hurting very badly for the last couple days, and that is swollen.  Denies any current IV drug use, no past medical history.  Has never had this happen before.  No history of gout in the family.  States that she tried using some topical anesthetic, without relief of her symptoms.  Has taken ibuprofen without relief.  Notes that it is swollen.     Home Medications Prior to Admission medications   Medication Sig Start Date End Date Taking? Authorizing Provider  cyanocobalamin (VITAMIN B12) 1000 MCG tablet Take 1 tablet (1,000 mcg total) by mouth daily. 02/08/23   Ivonne Andrew, NP  cyclobenzaprine (FLEXERIL) 10 MG tablet Take 0.5-1 tablets (5-10 mg total) by mouth 2 (two) times daily as needed for muscle spasms. Patient not taking: Reported on 10/26/2022 09/20/22   Arthor Captain, PA-C  diazepam (VALIUM) 5 MG tablet Please take 1.5 hours prior to procedure. May repeat x 1. Patient not taking: Reported on 02/01/2023 12/19/22   Myra Rude, MD  dicyclomine (BENTYL) 10 MG capsule Take 1 capsule (10 mg total) by mouth 4 (four) times daily -  before meals and at bedtime. Patient not taking: Reported on 09/05/2022 06/01/22   Edd Arbour R, CNM  gabapentin (NEURONTIN) 100 MG capsule TAKE 1 CAPSULE(100 MG) BY MOUTH THREE TIMES DAILY 02/26/23   Myra Rude, MD  HYDROcodone-acetaminophen (NORCO/VICODIN) 5-325 MG tablet Take 1 tablet by mouth every 8 (eight) hours as needed. Patient not taking: Reported on 10/26/2022 10/18/22   Myra Rude, MD  ibuprofen (ADVIL)  600 MG tablet Take 1 tablet (600 mg total) by mouth every 6 (six) hours as needed. Patient not taking: Reported on 12/13/2022 11/01/22   Myra Rude, MD  ketoconazole (NIZORAL) 2 % shampoo apply two times per week, massage into scalp and leave in for 10 minutes before rinsing out Patient not taking: Reported on 09/05/2022 05/08/22   Willeen Niece, MD  mirtazapine (REMERON) 7.5 MG tablet TAKE 1 TABLET(7.5 MG) BY MOUTH AT BEDTIME 03/07/23   Ivonne Andrew, NP  mometasone (ELOCON) 0.1 % cream Apply to affected itchy skin qd-bid prn, Avoid applying to face, groin, and axilla. Use as directed. Long-term use can cause thinning of the skin. Patient not taking: Reported on 09/05/2022 05/08/22   Willeen Niece, MD  omeprazole (PRILOSEC) 20 MG capsule TAKE 1 CAPSULE(20 MG) BY MOUTH DAILY 03/07/23   Ivonne Andrew, NP  ondansetron (ZOFRAN) 4 MG tablet Take 1 tablet (4 mg total) by mouth every 8 (eight) hours as needed for nausea or vomiting (DURING MENSEES). Patient not taking: Reported on 12/13/2022 11/03/22   Melton Alar R, PA-C  ondansetron (ZOFRAN) 4 MG tablet Take 1 tablet (4 mg total) by mouth every 6 (six) hours. Patient not taking: Reported on 02/01/2023 01/05/23   Wynetta Fines, MD  sucralfate (CARAFATE) 1 g tablet Take 1 tablet (1 g total) by mouth 4 (four) times daily -  with meals and at bedtime.  Patient not taking: Reported on 02/01/2023 01/06/23   Roxy Horseman, PA-C  Vitamin D, Ergocalciferol, (DRISDOL) 1.25 MG (50000 UNIT) CAPS capsule Take 1 capsule (50,000 Units total) by mouth every 7 (seven) days. 02/08/23   Ivonne Andrew, NP      Allergies    Patient has no known allergies.    Review of Systems   Review of Systems  Musculoskeletal:        +R hand pain  Skin:  Negative for rash.    Physical Exam Updated Vital Signs BP 105/71 (BP Location: Left Arm)   Pulse 88   Temp 98 F (36.7 C) (Oral)   Resp 16   Ht  (1.676 m)   Wt 52.6 kg   SpO2 100%   BMI 18.72 kg/m   Physical Exam Vitals and nursing note reviewed.  Constitutional:      General: She is not in acute distress.    Appearance: She is well-developed.  HENT:     Head: Normocephalic and atraumatic.  Eyes:     General:        Right eye: No discharge.        Left eye: No discharge.     Conjunctiva/sclera: Conjunctivae normal.  Pulmonary:     Effort: No respiratory distress.  Musculoskeletal:     Comments: Right hand: TTP of 1st metacarpal and 1st phalanx including proximal and distal. Radial pulses present. Grip strength intact. Able to flex, extend, ulnar and radial deviate wrist. Two point discrimination intact. Normal thumb opposition. Intact ROM for all MCPs, PIPs, and DIPs.  No snuffbox ttp. No sensory deficits. Capillary refill <2sec   Neurological:     Mental Status: She is alert.     Comments: Clear speech.   Psychiatric:        Behavior: Behavior normal.        Thought Content: Thought content normal.     ED Results / Procedures / Treatments   Labs (all labs ordered are listed, but only abnormal results are displayed) Labs Reviewed  COMPREHENSIVE METABOLIC PANEL - Abnormal; Notable for the following components:      Result Value   Glucose, Bld 108 (*)    All other components within normal limits  CBC WITH DIFFERENTIAL/PLATELET  URIC ACID    EKG None  Radiology DG Hand Complete Right  Result Date: 03/22/2023 CLINICAL DATA:  Pain and swelling to first digit of right hand. EXAM: RIGHT HAND - COMPLETE 3+ VIEW COMPARISON:  Right hand radiographs 12/13/2022 FINDINGS: Normal bone mineralization. Joint spaces are preserved. No acute fracture is seen. No dislocation. IMPRESSION: Normal right hand radiographs. Electronically Signed   By: Neita Garnet M.D.   On: 03/22/2023 19:20    Procedures Procedures    Medications Ordered in ED Medications  acetaminophen (TYLENOL) tablet 650 mg (650 mg Oral Given 03/22/23 1859)    ED Course/ Medical Decision Making/ A&P                              Medical Decision Making Patient is a 22 year old female, who presents to the ED secondary to right thumb swelling and pain that is been going on for the last 4 days.  She denies any trauma, no redness, no fevers.  No IV drug use.  We will obtain x-ray, and labs if if negative for x-ray.  Tylenol for pain  Amount and/or Complexity of Data Reviewed Labs: ordered.  Details: Labs unremarkable Radiology: ordered.    Details: X-ray unremarkable Discussion of management or test interpretation with external provider(s): Discussed with patient, she is tender to her first medical carpal, and phalanx.  She has no snuffbox tenderness, and has no difficulty with range of motion.  X-rays unremarkable, and labs were unremarkable for gout.  We discussed that she will need to follow-up with her primary care doctor, given that there is no trauma I think is unlikely to be due to a fracture, and it may just be from a sprain.  We discussed return precautions and she voiced understanding.   Risk OTC drugs.    Final Clinical Impression(s) / ED Diagnoses Final diagnoses:  Right hand pain    Rx / DC Orders ED Discharge Orders     None         Elexia Friedt, [place]ley [place], PA 03/22/23 2101    Loetta Rough, MD 03/27/23 [phone]

## 2023-03-22 NOTE — ED Triage Notes (Signed)
Patient has right hand pain for 4 days. No falls or trauma.

## 2023-03-23 ENCOUNTER — Telehealth: Payer: Self-pay

## 2023-03-23 NOTE — Transitions of Care (Post Inpatient/ED Visit) (Cosign Needed)
   03/23/2023  Name: Bethany Harris MRN: [id number] DOB: 2001/10/22  Today's TOC FU Call Status: Today's TOC FU Call Status:: Unsuccessul Call (1st Attempt) Unsuccessful Call (1st Attempt) Date: 03/23/23  Attempted to reach the patient regarding the most recent Inpatient/ED visit.  Follow Up Plan: Additional outreach attempts will be made to reach the patient to complete the Transitions of Care (Post Inpatient/ED visit) call.   Signature Renelda Loma RMA

## 2023-03-26 ENCOUNTER — Ambulatory Visit: Payer: Medicaid Other | Attending: Family Medicine | Admitting: Physical Therapy

## 2023-03-26 ENCOUNTER — Encounter: Payer: Self-pay | Admitting: Physical Therapy

## 2023-03-26 ENCOUNTER — Telehealth: Payer: Self-pay

## 2023-03-26 ENCOUNTER — Other Ambulatory Visit: Payer: Self-pay

## 2023-03-26 DIAGNOSIS — M5416 Radiculopathy, lumbar region: Secondary | ICD-10-CM | POA: Insufficient documentation

## 2023-03-26 DIAGNOSIS — M62838 Other muscle spasm: Secondary | ICD-10-CM | POA: Insufficient documentation

## 2023-03-26 DIAGNOSIS — M6281 Muscle weakness (generalized): Secondary | ICD-10-CM | POA: Insufficient documentation

## 2023-03-26 DIAGNOSIS — M5459 Other low back pain: Secondary | ICD-10-CM | POA: Diagnosis present

## 2023-03-26 DIAGNOSIS — R293 Abnormal posture: Secondary | ICD-10-CM | POA: Insufficient documentation

## 2023-03-26 NOTE — Therapy (Signed)
OUTPATIENT PHYSICAL THERAPY THORACOLUMBAR EVALUATION   Patient Name: Bethany Harris MRN: [id number] DOB:09-21-2001, 22 y.o., , female Today's Date: 03/27/2023  END OF SESSION:  PT End of Session - 03/26/23 1513     Visit Number 1    Number of Visits 16    Date for PT Re-Evaluation 05/21/23    Authorization Type [employer] Medicaid    PT Start Time 1545    PT Stop Time 1630    PT Time Calculation (min) 45 min    Activity Tolerance Patient tolerated treatment well    Behavior During Therapy WFL for tasks assessed/performed             Past Medical History:  Diagnosis Date   GERD (gastroesophageal reflux disease)    History reviewed. No pertinent surgical history. 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   GAD (generalized anxiety disorder) 04/13/2021   Mild depression 04/13/2021   Influenza vaccine refused 01/14/2021   Tetanus, diphtheria, and acellular pertussis (Tdap) vaccination declined 01/14/2021   Major depressive disorder, single episode, mild with anxious distress (HCC) 01/14/2021   Syncope     PCP: Angus Seller, NP  REFERRING PROVIDER: Myra Rude, MD  REFERRING DIAG:  M54.16 (ICD-10-CM) - Lumbar radiculopathy    Rationale for Evaluation and Treatment: Rehabilitation  THERAPY DIAG:  Other low back pain  Abnormal posture  Muscle weakness (generalized)  Other muscle spasm  ONSET DATE: ~6 months  SUBJECTIVE:                                                                                                                                                                                           SUBJECTIVE STATEMENT: Pt reports back pain since about 6 months ago. Pt states it was first exacerbated doing activities/lifting for a  wedding. She states she got injections in February. Reports she is still having pain but not as bad now. When she takes medication it helps. Pt is planning to get married in June and wants pain to be gone. Pt notes it hurts more during menses. Pt states R LE starts to hurt also. States stairs does not seem to exacerbate her symptoms nor prolonged walking. Primarily feels it most with prolonged standing.  PERTINENT HISTORY:  Has seen [facility] last year Needs Dari interpretor?  PAIN:  Are you having pain? Yes: NPRS scale: 5 currently, almost 10 at worst/10 Pain location: radiates down R LE (front and back),  R SIJ/pelvis Pain description: Dull, aching, sharp, radiates down R LE (front and back) Aggravating factors: Standing long periods (30 min to 1 hour), sometimes with sitting Relieving factors: medication  PRECAUTIONS: None  WEIGHT BEARING RESTRICTIONS: No  FALLS:  Has patient fallen in last 6 months? Yes. Number of falls 5  LIVING ENVIRONMENT: Lives with: lives with their family Lives in: House/apartment Stairs: Yes: Internal: 8 steps; on right going up Has following equipment at home: None  OCCUPATION: Cleans computers, restarting computers, packing computers  PLOF: Independent  PATIENT GOALS: No back pain  NEXT MD VISIT: 04/27/23  OBJECTIVE:   DIAGNOSTIC FINDINGS:  Lumbar MRI 12/16/22 IMPRESSION: 1. Small central disc protrusion at L4-L5 with right lateral recess narrowing. Correlate for L5 radiculopathy. 2. Small right subarticular disc protrusion at L5-S1 with slight narrowing of the right lateral recess. 3. No central spinal canal or neural foraminal stenosis.    PATIENT SURVEYS:  FOTO 50; predicted 60  SCREENING FOR RED FLAGS: Bowel or bladder incontinence: No Spinal tumors: No Cauda equina syndrome: No Compression fracture: No Abdominal aneurysm: No  COGNITION: Overall cognitive status: Within functional limits for tasks  assessed     SENSATION: Reports N/T to the R knee  MUSCLE LENGTH: Hamstrings: Right 45 deg; Left 80 deg Thomas test: Right 10 deg; Left 10 deg  POSTURE:  R iliac crest raised in standing, reduced pelvic rotation with lumbar flexion  PALPATION: Tender to palpation R SIJ, R glute and lumbar paraspinal  LUMBAR ROM:   AROM eval  Flexion 50%  Extension 100% *  Right lateral flexion 100% *  Left lateral flexion 100% **  Right rotation 100%  Left rotation 100%   (Blank rows = not tested) * = concordant pain  LOWER EXTREMITY ROM:   WFL  LOWER EXTREMITY MMT:    MMT Right eval Left eval  Hip flexion 4* 5  Hip extension 4- 5  Hip abduction 3+ 4+  Hip adduction    Hip internal rotation    Hip external rotation    Knee flexion 4+* 5  Knee extension 5 5  Ankle dorsiflexion    Ankle plantarflexion    Ankle inversion    Ankle eversion     (Blank rows = not tested) * = concordant pain   LUMBAR SPECIAL TESTS:  Straight leg raise test: Positive, SI Compression/distraction test: Positive, FABER test: Negative, Gaenslen's test: Positive, and Long sit test: Negative; Forton's finger (+) on R  FUNCTIONAL TESTS:  5 times sit to stand: 21.56 sec 24 sec forearm/knees plank   GAIT: Distance walked: 100' Assistive device utilized: None Level of assistance: Complete Independence Comments: Mildly antalgic with decreased R LE weight shift  TODAY'S TREATMENT:                                                                                                                              DATE: 03/27/23 See HEP below    PATIENT EDUCATION:  Education details: Exam findings, initial HEP, POC Person educated: Patient Education method: Explanation, Demonstration, and Handouts Education comprehension: verbalized understanding, returned demonstration, and needs further education  HOME EXERCISE PROGRAM: Access Code: [email or web address] URL: [email or web address] Date:  03/27/2023 Prepared by: Vernon Prey April Kirstie Peri  Exercises - Clamshell with Resistance  - 1 x daily - 7 x weekly - 2 sets - 10 reps - Prone Hip Extension with Resistance Loop  - 1 x daily - 7 x weekly - 2 sets - 10 reps - Plank on Knees  - 1 x daily - 7 x weekly - 1 sets - 2 reps - 20 sec hold - Seated Figure 4 Piriformis Stretch  - 1 x daily - 7 x weekly - 2 sets - 30 sec hold  ASSESSMENT:  CLINICAL IMPRESSION: Patient is a 22 y.o. F who was seen today for physical therapy evaluation and treatment for R side low back pain radiating into sacrum/hip. Assessment significant for R>L LE weakness, decreased core strength and endurance, tenderness to palpation along posterior hip and R lumbar paraspinals/QL. MRI demos disc protrusions; however, pt's site of pain and symptoms appear possibly due to SIJ issues (can see R iliac crest appears anteriorly rotated in supine compared to L). Pt will benefit from PT to address these deficits for return to pain free mobility.   OBJECTIVE IMPAIRMENTS: Abnormal gait, decreased activity tolerance, decreased mobility, difficulty walking, decreased ROM, decreased strength, increased fascial restrictions, increased muscle spasms, impaired flexibility, improper body mechanics, postural dysfunction, and pain.   ACTIVITY LIMITATIONS: carrying, lifting, bending, sitting, standing, squatting, transfers, locomotion level, and caring for others  PARTICIPATION LIMITATIONS: cleaning, community activity, occupation, and school  PERSONAL FACTORS: Age, Fitness, Past/current experiences, and Time since onset of injury/illness/exacerbation are also affecting patient's functional outcome.   REHAB POTENTIAL: Good  CLINICAL DECISION MAKING: Evolving/moderate complexity  EVALUATION COMPLEXITY: Moderate   GOALS: Goals reviewed with patient? Yes  SHORT TERM GOALS: Target date: 04/23/2023   Pt will be ind with initial HEP Baseline: Goal status: INITIAL  2.  Pt will be  able to demonstrate proper body mechanics with squatting to improve lifting Baseline:  Goal status: INITIAL   LONG TERM GOALS: Target date: 05/21/2023   Pt will be ind with progression and management of HEP Baseline:  Goal status: INITIAL  2.  Pt will be able to tolerate plank on forearms/feet x 30 sec to demo improving core strength Baseline:  Goal status: INITIAL  3.  Pt will be able to perform 5x STS in </=13 sec without UEs to demo improved functional LE strength Baseline:  Goal status: INITIAL  4.  Pt will be able to squat at least 15# to demo improved lifting mechanics Baseline:  Goal status: INITIAL  5.  Pt will report decrease in pain by >/=50% Baseline:  Goal status: INITIAL  6.  Pt will have improved FOTO score >/= 60 Baseline:  Goal status: INITIAL  PLAN:  PT FREQUENCY: 2x/week  PT DURATION: 8 weeks  PLANNED INTERVENTIONS: Therapeutic exercises, Therapeutic activity, Neuromuscular re-education, Balance training, Gait training, Patient/Family education, Self Care, Joint mobilization, Aquatic Therapy, Dry Needling, Spinal mobilization, Cryotherapy, Moist heat, Taping, Traction, Ionotophoresis 4mg /ml Dexamethasone, Manual therapy, and Re-evaluation.  PLAN FOR NEXT SESSION: Assess response to HEP. Assess SIJ. Continue core and LE strengthening.    Aleicia Kenagy April Ma L Sony Schlarb, PT 03/27/2023, 3:25 PM   Check all possible CPT codes: 09811 - PT Re-evaluation, 97110- Therapeutic Exercise, [phone]- Neuro Re-education, [phone] - Gait Training, [phone] -  Manual Therapy, 97530 - Therapeutic Activities, [phone] - Self Care, and [id number] - Aquatic therapy    Check all conditions that are expected to impact treatment: {Conditions expected to impact treatment:Musculoskeletal disorders   If treatment provided at initial evaluation, no treatment charged due to lack of authorization.

## 2023-03-26 NOTE — Transitions of Care (Post Inpatient/ED Visit) (Cosign Needed)
   03/26/2023  Name: Bethany Harris MRN: [id number] DOB: 09-Nov-2001  Today's TOC FU Call Status: Today's TOC FU Call Status:: Unsuccessful Call (2nd Attempt) Unsuccessful Call (2nd Attempt) Date: 03/26/23  Attempted to reach the patient regarding the most recent Inpatient/ED visit.  Follow Up Plan: Additional outreach attempts will be made to reach the patient to complete the Transitions of Care (Post Inpatient/ED visit) call.   Signature Renelda Loma RMA

## 2023-03-27 ENCOUNTER — Telehealth: Payer: Self-pay

## 2023-03-27 NOTE — Transitions of Care (Post Inpatient/ED Visit) (Cosign Needed)
   03/27/2023  Name: Bethany Harris MRN: [id number] DOB: 2001/08/09  Today's TOC FU Call Status: Today's TOC FU Call Status:: Unsuccessful Call (3rd Attempt) Unsuccessful Call (3rd Attempt) Date: 03/27/23  Attempted to reach the patient regarding the most recent Inpatient/ED visit.  Follow Up Plan: No further outreach attempts will be made at this time. We have been unable to contact the patient.  Signature Renelda Loma RMA

## 2023-04-02 ENCOUNTER — Emergency Department (HOSPITAL_COMMUNITY)
Admission: EM | Admit: 2023-04-02 | Discharge: 2023-04-02 | Disposition: A | Discharge status: Home / Self Care | Payer: Medicaid Other | Attending: Emergency Medicine | Admitting: Emergency Medicine

## 2023-04-02 ENCOUNTER — Emergency Department (HOSPITAL_COMMUNITY): Payer: Medicaid Other

## 2023-04-02 ENCOUNTER — Other Ambulatory Visit: Payer: Self-pay

## 2023-04-02 ENCOUNTER — Encounter (HOSPITAL_COMMUNITY): Payer: Self-pay

## 2023-04-02 DIAGNOSIS — T24201A Burn of second degree of unspecified site of right lower limb, except ankle and foot, initial encounter: Secondary | ICD-10-CM

## 2023-04-02 DIAGNOSIS — X102XXA Contact with fats and cooking oils, initial encounter: Secondary | ICD-10-CM | POA: Insufficient documentation

## 2023-04-02 DIAGNOSIS — T24231A Burn of second degree of right lower leg, initial encounter: Secondary | ICD-10-CM | POA: Insufficient documentation

## 2023-04-02 DIAGNOSIS — M25561 Pain in right knee: Secondary | ICD-10-CM | POA: Diagnosis present

## 2023-04-02 MED ORDER — BACITRACIN ZINC 500 UNIT/GM EX OINT
TOPICAL_OINTMENT | Freq: Once | CUTANEOUS | Status: AC
  Administered 2023-04-02: 1 via TOPICAL
  Filled 2023-04-02: qty 1.8

## 2023-04-02 MED ORDER — OXYCODONE-ACETAMINOPHEN 5-325 MG PO TABS
1.0000 | ORAL_TABLET | Freq: Once | ORAL | Status: AC
  Administered 2023-04-02: 1 via ORAL
  Filled 2023-04-02: qty 1

## 2023-04-02 MED ORDER — HYDROCODONE-ACETAMINOPHEN 5-325 MG PO TABS: 1.0000 | ORAL_TABLET | Freq: Four times a day (QID) | ORAL | 0 refills | Status: DC | PRN

## 2023-04-02 NOTE — ED Triage Notes (Signed)
Pt c/o oil burn on R knee x2 days.  Pt reports she was cooking and dripped oil on knee.  Pt noted to have difficulty ambulating.

## 2023-04-02 NOTE — ED Provider Notes (Signed)
[place] EMERGENCY DEPARTMENT AT [facility] Provider Note   CSN: [id number] Arrival date & time: 04/02/23  1820     History  Chief Complaint  Patient presents with   Burn    Bethany Harris is a 22 y.o. female with past medical history significant for depression, anxiety, who presents with concern for burn to the top of her right knee sustained 2 days ago.  Patient reports it hurts to bend the knee, and pain is extending away from the site of the burn which was concerning to her.  She denies any fever, chills.  She has been trying to keep the wound covered.   Burn      Home Medications Prior to Admission medications   Medication Sig Start Date End Date Taking? Authorizing Provider  HYDROcodone-acetaminophen (NORCO/VICODIN) 5-325 MG tablet Take 1 tablet by mouth every 6 (six) hours as needed. 04/02/23  Yes Yee Joss H, PA-C  cyanocobalamin (VITAMIN B12) 1000 MCG tablet Take 1 tablet (1,000 mcg total) by mouth daily. 02/08/23   Ivonne Andrew, NP  cyclobenzaprine (FLEXERIL) 10 MG tablet Take 0.5-1 tablets (5-10 mg total) by mouth 2 (two) times daily as needed for muscle spasms. Patient not taking: Reported on 10/26/2022 09/20/22   Arthor Captain, PA-C  diazepam (VALIUM) 5 MG tablet Please take 1.5 hours prior to procedure. May repeat x 1. Patient not taking: Reported on 02/01/2023 12/19/22   Myra Rude, MD  dicyclomine (BENTYL) 10 MG capsule Take 1 capsule (10 mg total) by mouth 4 (four) times daily -  before meals and at bedtime. Patient not taking: Reported on 09/05/2022 06/01/22   Edd Arbour R, CNM  gabapentin (NEURONTIN) 100 MG capsule TAKE 1 CAPSULE(100 MG) BY MOUTH THREE TIMES DAILY 02/26/23   Myra Rude, MD  ibuprofen (ADVIL) 600 MG tablet Take 1 tablet (600 mg total) by mouth every 6 (six) hours as needed. Patient not taking: Reported on 12/13/2022 11/01/22   Myra Rude, MD  ketoconazole (NIZORAL) 2 % shampoo apply two times per week,  massage into scalp and leave in for 10 minutes before rinsing out Patient not taking: Reported on 09/05/2022 05/08/22   Willeen Niece, MD  mirtazapine (REMERON) 7.5 MG tablet TAKE 1 TABLET(7.5 MG) BY MOUTH AT BEDTIME 03/07/23   Ivonne Andrew, NP  mometasone (ELOCON) 0.1 % cream Apply to affected itchy skin qd-bid prn, Avoid applying to face, groin, and axilla. Use as directed. Long-term use can cause thinning of the skin. Patient not taking: Reported on 09/05/2022 05/08/22   Willeen Niece, MD  omeprazole (PRILOSEC) 20 MG capsule TAKE 1 CAPSULE(20 MG) BY MOUTH DAILY 03/07/23   Ivonne Andrew, NP  ondansetron (ZOFRAN) 4 MG tablet Take 1 tablet (4 mg total) by mouth every 8 (eight) hours as needed for nausea or vomiting (DURING MENSEES). Patient not taking: Reported on 12/13/2022 11/03/22   Melton Alar R, PA-C  ondansetron (ZOFRAN) 4 MG tablet Take 1 tablet (4 mg total) by mouth every 6 (six) hours. Patient not taking: Reported on 02/01/2023 01/05/23   Wynetta Fines, MD  sucralfate (CARAFATE) 1 g tablet Take 1 tablet (1 g total) by mouth 4 (four) times daily -  with meals and at bedtime. Patient not taking: Reported on 02/01/2023 01/06/23   Roxy Horseman, PA-C  Vitamin D, Ergocalciferol, (DRISDOL) 1.25 MG (50000 UNIT) CAPS capsule Take 1 capsule (50,000 Units total) by mouth every 7 (seven) days. 02/08/23   Ivonne Andrew, NP  Allergies    Patient has no known allergies.    Review of Systems   Review of Systems  All other systems reviewed and are negative.   Physical Exam Updated Vital Signs BP 98/70 (BP Location: Left Arm)   Pulse 85   Temp 98.8 F (37.1 C) (Oral)   Resp 16   Ht 5\' 6"  (1.676 m)   Wt 52.6 kg   LMP 03/30/2023   SpO2 98%   BMI 18.72 kg/m  Physical Exam Vitals and nursing note reviewed.  Constitutional:      General: She is not in acute distress.    Appearance: Normal appearance.  HENT:     Head: Normocephalic and atraumatic.  Eyes:     General:         Right eye: No discharge.        Left eye: No discharge.  Cardiovascular:     Rate and Rhythm: Normal rate and regular rhythm.  Pulmonary:     Effort: Pulmonary effort is normal. No respiratory distress.  Musculoskeletal:        General: No deformity.     Comments: Intact range of motion to flexion, extension of the affected right knee  Skin:    General: Skin is warm and dry.     Comments: Patient with a second-degree burn with approximate area of around 2 x 3 cm on the anterior right knee.  There is some surrounding redness without fluctuance.  There is no purulent drainage noted.  Neurological:     Mental Status: She is alert and oriented to person, place, and time.  Psychiatric:        Mood and Affect: Mood normal.        Behavior: Behavior normal.     ED Results / Procedures / Treatments   Labs (all labs ordered are listed, but only abnormal results are displayed) Labs Reviewed - No data to display  EKG None  Radiology DG Knee 2 Views Right  Result Date: 04/02/2023 CLINICAL DATA:  Burn injury to the right knee with concern for cellulitis EXAM: RIGHT KNEE - 2 VIEW COMPARISON:  None Available. FINDINGS: No evidence of fracture, dislocation, or joint effusion. No evidence of arthropathy or other focal bone abnormality. Prepatellar soft tissue swelling. IMPRESSION: Prepatellar soft tissue swelling. No acute osseous abnormality. Electronically Signed   By: Agustin Cree M.D.   On: 04/02/2023 19:48    Procedures Procedures    Medications Ordered in ED Medications  oxyCODONE-acetaminophen (PERCOCET/ROXICET) 5-325 MG per tablet 1 tablet (1 tablet Oral Given 04/02/23 1945)  bacitracin ointment (1 Application Topical Given 04/02/23 2056)    ED Course/ Medical Decision Making/ A&P                             Medical Decision Making Amount and/or Complexity of Data Reviewed Radiology: ordered.  Risk OTC drugs. Prescription drug management.   This patient is a 22 y.o. female who  presents to the ED for concern of burn of knee.   Differential diagnoses prior to evaluation: Burn of 1st, 2nd, or 3rd degree, secondary infection, septic arthritis, vs other  Past Medical History / Social History / Additional history: Chart reviewed. Pertinent results include: anxiety, depression  Physical Exam: Physical exam performed. The pertinent findings include: second degree burn of right knee without evidence of secondary infection. Normal rom with some pain.  Medications / Treatment: Bacitracin dressing applied, encourage to continue wet dressings.  Administered and discharged with very short course of pain medication.   Disposition: After consideration of the diagnostic results and the patients response to treatment, I feel that patient is stable for discharge with plan to follow up with burn center .   emergency department workup does not suggest an emergent condition requiring admission or immediate intervention beyond what has been performed at this time. The plan is: as above. The patient is safe for discharge and has been instructed to return immediately for worsening symptoms, change in symptoms or any other concerns.  Final Clinical Impression(s) / ED Diagnoses Final diagnoses:  Partial thickness burn of right lower extremity, initial encounter    Rx / DC Orders ED Discharge Orders          Ordered    HYDROcodone-acetaminophen (NORCO/VICODIN) 5-325 MG tablet  Every 6 hours PRN        04/02/23 2035              [place] 04/02/23 2150    Wynetta Fines, MD 04/02/23 2326

## 2023-04-02 NOTE — Discharge Instructions (Addendum)
Please use Tylenol or ibuprofen for pain.  You may use 600 mg ibuprofen every 6 hours or 1000 mg of Tylenol every 6 hours.  You may choose to alternate between the 2.  This would be most effective.  Not to exceed 4 g of Tylenol within 24 hours.  Not to exceed 3200 mg ibuprofen 24 hours.  

## 2023-04-03 ENCOUNTER — Telehealth: Payer: Self-pay

## 2023-04-03 NOTE — Transitions of Care (Post Inpatient/ED Visit) (Cosign Needed)
04/03/2023  Name: Bethany Harris MRN: [id number] DOB: [birth year]  Today's TOC FU Call Status: Today's TOC FU Call Status:: Successful TOC FU Call Competed TOC FU Call Complete Date: 04/03/23  Transition Care Management Follow-up Telephone Call Date of Discharge: 04/02/23 Discharge Facility: [facility]) Type of Discharge: Emergency Department Reason for ED Visit: Other: (right leg and knee) How have you been since you were released from the hospital?: Better Any questions or concerns?: No  Items Reviewed: Did you receive and understand the discharge instructions provided?: Yes Medications obtained,verified, and reconciled?: Yes (Medications Reviewed) Any new allergies since your discharge?: No Do you have support at home?: Yes People in Home: other relative(s)  Medications Reviewed Today: Medications Reviewed Today     Reviewed by Renelda Loma, RMA (Registered Medical Assistant) on 04/03/23 at 1358  Med List Status: <None>   Medication Order Taking? Sig Documenting Provider Last Dose Status Informant  cyanocobalamin (VITAMIN B12) 1000 MCG tablet [id number] Yes Take 1 tablet (1,000 mcg total) by mouth daily. Ivonne Andrew, NP Taking Active   cyclobenzaprine (FLEXERIL) 10 MG tablet [id number] No Take 0.5-1 tablets (5-10 mg total) by mouth 2 (two) times daily as needed for muscle spasms.  Patient not taking: Reported on 10/26/2022   Arthor Captain, PA-C Not Taking Active   diazepam (VALIUM) 5 MG tablet [id number] No Please take 1.5 hours prior to procedure. May repeat x 1.  Patient not taking: Reported on 02/01/2023   Myra Rude, MD Not Taking Active   dicyclomine (BENTYL) 10 MG capsule [id number] No Take 1 capsule (10 mg total) by mouth 4 (four) times daily -  before meals and at bedtime.  Patient not taking: Reported on 09/05/2022   Bernerd Limbo, CNM Not Taking Active   gabapentin (NEURONTIN) 100 MG capsule [id number] Yes TAKE 1 CAPSULE(100 MG) BY MOUTH THREE TIMES  DAILY Myra Rude, MD Taking Active   HYDROcodone-acetaminophen (NORCO/VICODIN) 5-325 MG tablet [id number] Yes Take 1 tablet by mouth every 6 (six) hours as needed. Prosperi, Christian H, PA-C Taking Active   ibuprofen (ADVIL) 600 MG tablet [id number] No Take 1 tablet (600 mg total) by mouth every 6 (six) hours as needed.  Patient not taking: Reported on 12/13/2022   Myra Rude, MD Not Taking Active   ketoconazole (NIZORAL) 2 % shampoo [id number] No apply two times per week, massage into scalp and leave in for 10 minutes before rinsing out  Patient not taking: Reported on 09/05/2022   Willeen Niece, MD Not Taking Active Self, Pharmacy Records  mirtazapine (REMERON) 7.5 MG tablet [id number] Yes TAKE 1 TABLET(7.5 MG) BY MOUTH AT BEDTIME Ivonne Andrew, NP Taking Active   mometasone (ELOCON) 0.1 % cream [id number] No Apply to affected itchy skin qd-bid prn, Avoid applying to face, groin, and axilla. Use as directed. Long-term use can cause thinning of the skin.  Patient not taking: Reported on 09/05/2022   Willeen Niece, MD Not Taking Active Self, Pharmacy Records  omeprazole (PRILOSEC) 20 MG capsule [id number] Yes TAKE 1 CAPSULE(20 MG) BY MOUTH DAILY Ivonne Andrew, NP Taking Active   ondansetron (ZOFRAN) 4 MG tablet [id number] No Take 1 tablet (4 mg total) by mouth every 8 (eight) hours as needed for nausea or vomiting (DURING MENSEES).  Patient not taking: Reported on 12/13/2022   Lenard Simmer, PA-C Not Taking Active   ondansetron (ZOFRAN) 4 MG tablet [id number] No Take 1 tablet (4 mg total) by mouth every 6 (six) hours.  Patient  not taking: Reported on 02/01/2023   Wynetta Fines, MD Not Taking Active   sucralfate (CARAFATE) 1 g tablet [id number] No Take 1 tablet (1 g total) by mouth 4 (four) times daily -  with meals and at bedtime.  Patient not taking: Reported on 02/01/2023   Roxy Horseman, PA-C Not Taking Active   Vitamin D, Ergocalciferol, (DRISDOL) 1.25 MG (50000 UNIT) CAPS  capsule [id number] Yes Take 1 capsule (50,000 Units total) by mouth every 7 (seven) days. Ivonne Andrew, NP Taking Active             Home Care and Equipment/Supplies: Were Home Health Services Ordered?: NA Any new equipment or medical supplies ordered?: NA  Functional Questionnaire: Do you need assistance with bathing/showering or dressing?: No Do you need assistance with meal preparation?: No Do you need assistance with eating?: No Do you have difficulty maintaining continence: No Do you need assistance with getting out of bed/getting out of a chair/moving?: No Do you have difficulty managing or taking your medications?: No  Follow up appointments reviewed: PCP Follow-up appointment confirmed?: Yes Date of PCP follow-up appointment?: 04/13/23 Follow-up Provider: [facility] Follow-up appointment confirmed?: NA Do you need transportation to your follow-up appointment?: No Do you understand care options if your condition(s) worsen?: Yes-patient verbalized understanding    SIGNATURE Renelda Loma RMA

## 2023-04-06 ENCOUNTER — Other Ambulatory Visit: Payer: Self-pay | Admitting: Nurse Practitioner

## 2023-04-06 DIAGNOSIS — R630 Anorexia: Secondary | ICD-10-CM

## 2023-04-06 DIAGNOSIS — K219 Gastro-esophageal reflux disease without esophagitis: Secondary | ICD-10-CM

## 2023-04-10 ENCOUNTER — Telehealth: Payer: Self-pay

## 2023-04-10 NOTE — Telephone Encounter (Signed)
Error

## 2023-04-12 ENCOUNTER — Ambulatory Visit: Payer: Medicaid Other | Attending: Family Medicine

## 2023-04-12 DIAGNOSIS — M6281 Muscle weakness (generalized): Secondary | ICD-10-CM | POA: Diagnosis present

## 2023-04-12 DIAGNOSIS — R293 Abnormal posture: Secondary | ICD-10-CM | POA: Diagnosis present

## 2023-04-12 DIAGNOSIS — M5459 Other low back pain: Secondary | ICD-10-CM | POA: Diagnosis present

## 2023-04-12 DIAGNOSIS — M62838 Other muscle spasm: Secondary | ICD-10-CM | POA: Diagnosis present

## 2023-04-12 NOTE — Therapy (Signed)
OUTPATIENT PHYSICAL THERAPY TREATMENT NOTE   Patient Name: Bethany Harris MRN: [id number] DOB:12-22-2000, 22 y.o., female Today's Date: 04/12/2023  PCP: Angus Seller, NP  REFERRING PROVIDER: Myra Rude, MD   END OF SESSION:   PT End of Session - 04/12/23 1612     Visit Number 2    Number of Visits 16    Date for PT Re-Evaluation 05/21/23    Authorization Type [employer] Medicaid    PT Start Time 1615    PT Stop Time 1654    PT Time Calculation (min) 39 min    Activity Tolerance Patient tolerated treatment well;Patient limited by pain    Behavior During Therapy WFL for tasks assessed/performed             Past Medical History:  Diagnosis Date   GERD (gastroesophageal reflux disease)    History reviewed. No pertinent surgical history. 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   GAD (generalized anxiety disorder) 04/13/2021   Mild depression 04/13/2021   Influenza vaccine refused 01/14/2021   Tetanus, diphtheria, and acellular pertussis (Tdap) vaccination declined 01/14/2021   Major depressive disorder, single episode, mild with anxious distress (HCC) 01/14/2021   Syncope     REFERRING DIAG: M54.16 (ICD-10-CM) - Lumbar radiculopathy   THERAPY DIAG:  Other low back pain  Muscle weakness (generalized)  Abnormal posture  Other muscle spasm  Rationale for Evaluation and Treatment Rehabilitation  PERTINENT HISTORY: Has seen [facility] last year Needs Dari interpretor?  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    PAIN:  Are you having pain? Yes: NPRS scale: 6 currently, almost 10 at worst/10 Pain location: radiates down R LE (front and back), R SIJ/pelvis Pain description: Dull, aching, sharp, radiates down R LE (front and back) Aggravating factors: Standing long periods (30 min to 1 hour), sometimes with sitting Relieving factors: medication   OBJECTIVE: (objective measures completed at initial evaluation unless otherwise dated)   DIAGNOSTIC FINDINGS:  Lumbar MRI 12/16/22 IMPRESSION: 1. Small central disc protrusion at L4-L5 with right lateral recess narrowing. Correlate for L5 radiculopathy. 2. Small right subarticular disc protrusion at  L5-S1 with slight narrowing of the right lateral recess. 3. No central spinal canal or neural foraminal stenosis.     PATIENT SURVEYS:  FOTO 50; predicted 60   SCREENING FOR RED FLAGS: Bowel or bladder incontinence: No Spinal tumors: No Cauda equina syndrome: No Compression fracture: No Abdominal aneurysm: No   COGNITION: Overall cognitive status: Within functional limits for tasks assessed                          SENSATION: Reports N/T to the R knee   MUSCLE LENGTH: Hamstrings: Right 45 deg; Left 80 deg Thomas test: Right 10 deg; Left 10 deg   POSTURE:  R iliac crest raised in standing, reduced pelvic rotation with lumbar flexion   PALPATION: Tender to palpation R SIJ, R glute and lumbar paraspinal   LUMBAR ROM:    AROM eval  Flexion 50%  Extension 100% *  Right lateral flexion 100% *  Left lateral flexion 100% **  Right rotation 100%  Left rotation 100%   (Blank rows = not tested) * = concordant pain   LOWER EXTREMITY ROM:   WFL   LOWER EXTREMITY MMT:     MMT Right eval Left eval  Hip flexion 4* 5  Hip extension 4- 5  Hip abduction 3+ 4+  Hip adduction      Hip internal rotation      Hip external rotation      Knee flexion 4+* 5   Knee extension 5 5  Ankle dorsiflexion      Ankle plantarflexion      Ankle inversion      Ankle eversion       (Blank rows = not tested) * = concordant pain     LUMBAR SPECIAL TESTS:  Straight leg raise test: Positive, SI Compression/distraction test: Positive, FABER test: Negative, Gaenslen's test: Positive, and Long sit test: Negative; Forton's finger (+) on R   FUNCTIONAL TESTS:  5 times sit to stand: 21.56 sec 24 sec forearm/knees plank     GAIT: Distance walked: 100' Assistive device utilized: None Level of assistance: Complete Independence Comments: Mildly antalgic with decreased R LE weight shift   TODAY'S TREATMENT:             [facility] Adult PT Treatment:                                                DATE: 04/12/23 Therapeutic Exercise: Nustep level 5 x 5 mins Standing hip abduction/extension 2x10 BIL Supine figure 4 piriformis stretch 2x30" BIL Supine QL stretch - too painful Bridges x10 SLR with pilates ring x10 BIL Prone on elbows x1' Prone press ups x10 Supine hamstring stretch with strap 2x30" BIL  DATE: 03/27/23 See HEP below      PATIENT EDUCATION:  Education details: Exam findings, initial HEP, POC Person educated: Patient Education method: Explanation, Demonstration, and Handouts Education comprehension: verbalized understanding, returned demonstration, and needs further education   HOME EXERCISE PROGRAM: Access Code: [email or web address] URL: [email or web address] Date: 03/27/2023 Prepared by: Vernon Prey April Kirstie Peri   Exercises - Clamshell with Resistance  - 1 x daily - 7 x weekly - 2 sets - 10 reps - Prone Hip Extension with Resistance Loop  - 1 x daily - 7 x weekly - 2 sets - 10 reps - Plank on Knees  - 1 x daily - 7 x weekly - 1 sets - 2 reps - 20 sec hold - Seated Figure 4 Piriformis Stretch  - 1 x daily - 7 x weekly - 2  sets - 30 sec hold   ASSESSMENT:   CLINICAL IMPRESSION: Patient presents to first follow up PT session reporting continued lower back pain as well as HEP compliance. She states that she works 10 hours a day and also helps take care of her 5 younger siblings and cooks every day. Session today focused on proximal hip, LE, and core strengthening as well as stretching for hips and hamstrings. She is somewhat limited by pain throughout, but is able to complete exercises. Patient continues to benefit from skilled PT services and should be progressed as able to improve functional independence.    OBJECTIVE IMPAIRMENTS: Abnormal gait, decreased activity tolerance, decreased mobility, difficulty walking, decreased ROM, decreased strength, increased fascial restrictions, increased muscle spasms, impaired flexibility, improper body mechanics, postural dysfunction, and pain.    ACTIVITY LIMITATIONS: carrying, lifting, bending, sitting, standing, squatting, transfers, locomotion level, and caring for others   PARTICIPATION LIMITATIONS: cleaning, community activity, occupation, and school   PERSONAL FACTORS: Age, Fitness, Past/current experiences, and Time since onset of injury/illness/exacerbation are also affecting patient's functional outcome.    REHAB POTENTIAL: Good   CLINICAL DECISION MAKING: Evolving/moderate complexity   EVALUATION COMPLEXITY: Moderate     GOALS: Goals reviewed with patient? Yes   SHORT TERM GOALS: Target date: 04/23/2023     Pt will be ind with initial HEP Baseline: Goal status: INITIAL   2.  Pt will be able to demonstrate proper body mechanics with squatting to improve lifting Baseline:  Goal status: INITIAL     LONG TERM GOALS: Target date: 05/21/2023     Pt will be ind with progression and management of HEP Baseline:  Goal status: INITIAL   2.  Pt will be able to tolerate plank on forearms/feet x 30 sec to demo improving core strength Baseline:  Goal  status: INITIAL   3.  Pt will be able to perform 5x STS in </=13 sec without UEs to demo improved functional LE strength Baseline:  Goal status: INITIAL   4.  Pt will be able to squat at least 15# to demo improved lifting mechanics Baseline:  Goal status: INITIAL   5.  Pt will report decrease in pain by >/=50% Baseline:  Goal status: INITIAL   6.  Pt will have improved FOTO score >/= 60 Baseline:  Goal status: INITIAL   PLAN:   PT FREQUENCY: 2x/week   PT DURATION: 8 weeks   PLANNED INTERVENTIONS: Therapeutic exercises, Therapeutic activity, Neuromuscular re-education, Balance training, Gait training, Patient/Family education, Self Care, Joint mobilization, Aquatic Therapy, Dry Needling, Spinal mobilization, Cryotherapy, Moist heat, Taping, Traction, Ionotophoresis 4mg /ml Dexamethasone, Manual therapy, and Re-evaluation.   PLAN FOR NEXT SESSION: Assess response  to HEP. Assess SIJ. Continue core and LE strengthening.     Berta Minor, PTA 04/12/2023, 4:54 PM

## 2023-04-13 ENCOUNTER — Ambulatory Visit (INDEPENDENT_AMBULATORY_CARE_PROVIDER_SITE_OTHER): Payer: Medicaid Other | Admitting: Nurse Practitioner

## 2023-04-13 ENCOUNTER — Encounter: Payer: Self-pay | Admitting: Nurse Practitioner

## 2023-04-13 VITALS — BP 89/78 | HR 102 | Temp 97.7°F | Wt 112.6 lb

## 2023-04-13 DIAGNOSIS — T24221A Burn of second degree of right knee, initial encounter: Secondary | ICD-10-CM

## 2023-04-13 DIAGNOSIS — T24221D Burn of second degree of right knee, subsequent encounter: Secondary | ICD-10-CM

## 2023-04-13 HISTORY — DX: Burn of second degree of right knee, initial encounter: T24.221A

## 2023-04-13 MED ORDER — CEPHALEXIN 500 MG PO CAPS
500.0000 mg | ORAL_CAPSULE | Freq: Two times a day (BID) | ORAL | 0 refills | Status: AC
Start: 2023-04-13 — End: 2023-04-23

## 2023-04-13 MED ORDER — MUPIROCIN 2 % EX OINT
1.0000 | TOPICAL_OINTMENT | Freq: Two times a day (BID) | CUTANEOUS | 0 refills | Status: DC
Start: 2023-04-13 — End: 2024-02-05

## 2023-04-13 NOTE — Assessment & Plan Note (Signed)
-   mupirocin ointment (BACTROBAN) 2 %; Apply 1 Application topically 2 (two) times daily.  Dispense: 22 g; Refill: 0 - cephALEXin (KEFLEX) 500 MG capsule; Take 1 capsule (500 mg total) by mouth 2 (two) times daily for 10 days.  Dispense: 20 capsule; Refill: 0  Follow up:  Follow up as needed

## 2023-04-13 NOTE — Patient Instructions (Signed)
1. Partial thickness burn of right knee, subsequent encounter  - mupirocin ointment (BACTROBAN) 2 %; Apply 1 Application topically 2 (two) times daily.  Dispense: 22 g; Refill: 0 - cephALEXin (KEFLEX) 500 MG capsule; Take 1 capsule (500 mg total) by mouth 2 (two) times daily for 10 days.  Dispense: 20 capsule; Refill: 0  Follow up:  Follow up as needed

## 2023-04-13 NOTE — Progress Notes (Signed)
@Patient  ID: Bethany Harris, female    DOB: 08/16/2001, 22 y.o.   MRN: [id number]  Chief Complaint  Patient presents with   Hospitalization Follow-up    Right knee pain    Referring provider: Ivonne Andrew, NP   HPI  Patient presents today for an ED follow-up.  Patient was seen in the ED on 04/02/2023 for partial-thickness burn to her right knee.  She states that this occurred after dropping hot oil on her leg.  She was given pain pills.  She states that the area is still painful.  The area is red around the edges.  It has scabbed over.  There is no drainage. Denies f/c/s, n/v/d, hemoptysis, PND, leg swelling Denies chest pain or edema       No Known Allergies  Immunization History  Administered Date(s) Administered   Influenza,inj,Quad PF,6+ Mos 12/13/2022    Past Medical History:  Diagnosis Date   GERD (gastroesophageal reflux disease)     Tobacco History: Social History   Tobacco Use  Smoking Status Never   Passive exposure: Never  Smokeless Tobacco Never   Counseling given: Not Answered   Outpatient Encounter Medications as of 04/13/2023  Medication Sig   cephALEXin (KEFLEX) 500 MG capsule Take 1 capsule (500 mg total) by mouth 2 (two) times daily for 10 days.   cyanocobalamin (VITAMIN B12) 1000 MCG tablet Take 1 tablet (1,000 mcg total) by mouth daily.   gabapentin (NEURONTIN) 100 MG capsule TAKE 1 CAPSULE(100 MG) BY MOUTH THREE TIMES DAILY   ibuprofen (ADVIL) 600 MG tablet Take 1 tablet (600 mg total) by mouth every 6 (six) hours as needed.   mupirocin ointment (BACTROBAN) 2 % Apply 1 Application topically 2 (two) times daily.   omeprazole (PRILOSEC) 20 MG capsule TAKE 1 CAPSULE(20 MG) BY MOUTH DAILY   cyclobenzaprine (FLEXERIL) 10 MG tablet Take 0.5-1 tablets (5-10 mg total) by mouth 2 (two) times daily as needed for muscle spasms. (Patient not taking: Reported on 10/26/2022)   diazepam (VALIUM) 5 MG tablet Please take 1.5 hours prior to procedure. May  repeat x 1. (Patient not taking: Reported on 02/01/2023)   dicyclomine (BENTYL) 10 MG capsule Take 1 capsule (10 mg total) by mouth 4 (four) times daily -  before meals and at bedtime. (Patient not taking: Reported on 09/05/2022)   HYDROcodone-acetaminophen (NORCO/VICODIN) 5-325 MG tablet Take 1 tablet by mouth every 6 (six) hours as needed.   ketoconazole (NIZORAL) 2 % shampoo apply two times per week, massage into scalp and leave in for 10 minutes before rinsing out (Patient not taking: Reported on 09/05/2022)   mirtazapine (REMERON) 7.5 MG tablet TAKE 1 TABLET(7.5 MG) BY MOUTH AT BEDTIME (Patient not taking: Reported on 04/13/2023)   mometasone (ELOCON) 0.1 % cream Apply to affected itchy skin qd-bid prn, Avoid applying to face, groin, and axilla. Use as directed. Long-term use can cause thinning of the skin. (Patient not taking: Reported on 09/05/2022)   ondansetron (ZOFRAN) 4 MG tablet Take 1 tablet (4 mg total) by mouth every 8 (eight) hours as needed for nausea or vomiting (DURING MENSEES). (Patient not taking: Reported on 12/13/2022)   ondansetron (ZOFRAN) 4 MG tablet Take 1 tablet (4 mg total) by mouth every 6 (six) hours. (Patient not taking: Reported on 02/01/2023)   sucralfate (CARAFATE) 1 g tablet Take 1 tablet (1 g total) by mouth 4 (four) times daily -  with meals and at bedtime. (Patient not taking: Reported on 02/01/2023)   Vitamin D,  Ergocalciferol, (DRISDOL) 1.25 MG (50000 UNIT) CAPS capsule Take 1 capsule (50,000 Units total) by mouth every 7 (seven) days. (Patient not taking: Reported on 04/13/2023)   No facility-administered encounter medications on file as of 04/13/2023.     Review of Systems  Review of Systems     Physical Exam  BP (!) 89/78   Pulse (!) 102   Temp 97.7 F (36.5 C)   Wt 112 lb 9.6 oz (51.1 kg)   LMP 03/30/2023   SpO2 100%   BMI 18.17 kg/m   Wt Readings from Last 5 Encounters:  04/13/23 112 lb 9.6 oz (51.1 kg)  04/02/23 116 lb (52.6 kg)  03/22/23 116  lb (52.6 kg)  03/01/23 114 lb (51.7 kg)  02/07/23 114 lb 9.6 oz (52 kg)     Physical Exam Vitals and nursing note reviewed.  Constitutional:      General: She is not in acute distress.    Appearance: She is well-developed.  Cardiovascular:     Rate and Rhythm: Normal rate and regular rhythm.  Pulmonary:     Effort: Pulmonary effort is normal.     Breath sounds: Normal breath sounds.  Skin:    Findings: Wound (Patient with a second-degree burn with approximate area of around 2 x 3 cm on the anterior right knee.  There is surrounding erythema without fluctuance.  There is no purulent drainage noted.) present.  Neurological:     Mental Status: She is alert and oriented to person, place, and time.      Lab Results:  CBC    Component Value Date/Time   WBC 9.7 03/22/2023 1952   RBC 4.62 03/22/2023 1952   HGB 13.3 03/22/2023 1952   HGB 13.0 02/07/2023 1206   HCT 39.5 03/22/2023 1952   HCT 40.1 02/07/2023 1206   PLT 178 03/22/2023 1952   PLT 184 02/07/2023 1206   MCV 85.5 03/22/2023 1952   MCV 87 02/07/2023 1206   MCH 28.8 03/22/2023 1952   MCHC 33.7 03/22/2023 1952   RDW 12.2 03/22/2023 1952   RDW 12.7 02/07/2023 1206   LYMPHSABS 3.2 03/22/2023 1952   MONOABS 0.6 03/22/2023 1952   EOSABS 0.1 03/22/2023 1952   BASOSABS 0.0 03/22/2023 1952    BMET    Component Value Date/Time   NA 136 03/22/2023 1952   NA 141 02/07/2023 1206   K 3.9 03/22/2023 1952   CL 105 03/22/2023 1952   CO2 22 03/22/2023 1952   GLUCOSE 108 (H) 03/22/2023 1952   BUN 12 03/22/2023 1952   BUN 12 02/07/2023 1206   CREATININE 0.73 03/22/2023 1952   CALCIUM 9.1 03/22/2023 1952   GFRNONAA >60 03/22/2023 1952    BNP No results found for: "BNP"  ProBNP No results found for: "PROBNP"  Imaging: DG Knee 2 Views Right  Result Date: 04/02/2023 CLINICAL DATA:  Burn injury to the right knee with concern for cellulitis EXAM: RIGHT KNEE - 2 VIEW COMPARISON:  None Available. FINDINGS: No evidence of  fracture, dislocation, or joint effusion. No evidence of arthropathy or other focal bone abnormality. Prepatellar soft tissue swelling. IMPRESSION: Prepatellar soft tissue swelling. No acute osseous abnormality. Electronically Signed   By: Agustin Cree M.D.   On: 04/02/2023 19:48   DG Hand Complete Right  Result Date: 03/22/2023 CLINICAL DATA:  Pain and swelling to first digit of right hand. EXAM: RIGHT HAND - COMPLETE 3+ VIEW COMPARISON:  Right hand radiographs 12/13/2022 FINDINGS: Normal bone mineralization. Joint spaces are preserved. No  acute fracture is seen. No dislocation. IMPRESSION: Normal right hand radiographs. Electronically Signed   By: Neita Garnet M.D.   On: 03/22/2023 19:20     Assessment & Plan:   Second degree burn of right knee - mupirocin ointment (BACTROBAN) 2 %; Apply 1 Application topically 2 (two) times daily.  Dispense: 22 g; Refill: 0 - cephALEXin (KEFLEX) 500 MG capsule; Take 1 capsule (500 mg total) by mouth 2 (two) times daily for 10 days.  Dispense: 20 capsule; Refill: 0  Follow up:  Follow up as needed     Ivonne Andrew, NP 04/13/2023

## 2023-04-15 ENCOUNTER — Other Ambulatory Visit: Payer: Self-pay | Admitting: Family Medicine

## 2023-04-18 ENCOUNTER — Emergency Department (HOSPITAL_COMMUNITY)
Admission: EM | Admit: 2023-04-18 | Discharge: 2023-04-19 | Discharge status: Against Medical Advice | Payer: Medicaid Other | Attending: Emergency Medicine | Admitting: Emergency Medicine

## 2023-04-18 ENCOUNTER — Other Ambulatory Visit: Payer: Self-pay

## 2023-04-18 ENCOUNTER — Encounter (HOSPITAL_COMMUNITY): Payer: Self-pay | Admitting: Emergency Medicine

## 2023-04-18 DIAGNOSIS — Z5321 Procedure and treatment not carried out due to patient leaving prior to being seen by health care provider: Secondary | ICD-10-CM | POA: Insufficient documentation

## 2023-04-18 DIAGNOSIS — R519 Headache, unspecified: Secondary | ICD-10-CM | POA: Diagnosis not present

## 2023-04-18 DIAGNOSIS — R42 Dizziness and giddiness: Secondary | ICD-10-CM | POA: Diagnosis not present

## 2023-04-18 DIAGNOSIS — M545 Low back pain, unspecified: Secondary | ICD-10-CM | POA: Diagnosis present

## 2023-04-18 LAB — I-STAT BETA HCG BLOOD, ED (MC, WL, AP ONLY): I-stat hCG, quantitative: <5 m[IU]/mL (ref <5)

## 2023-04-18 LAB — URINALYSIS, ROUTINE W REFLEX MICROSCOPIC
Bilirubin Urine: NEGATIVE
Glucose, UA: NEGATIVE mg/dL
Hgb urine dipstick: NEGATIVE
Ketones, ur: 20 mg/dL — AB
Nitrite: NEGATIVE
Protein, ur: 30 mg/dL — AB
Specific Gravity, Urine: 1.025 (ref 1.005–1.030)
pH: 5 (ref 5.0–8.0)

## 2023-04-18 LAB — CBC
HCT: 38 % (ref 36.0–46.0)
Hemoglobin: 12.6 g/dL (ref 12.0–15.0)
MCH: 28.4 pg (ref 26.0–34.0)
MCHC: 33.2 g/dL (ref 30.0–36.0)
MCV: 85.6 fL (ref 80.0–100.0)
Platelets: 171 10*3/uL (ref 150–400)
RBC: 4.44 MIL/uL (ref 3.87–5.11)
RDW: 11.8 % (ref 11.5–15.5)
WBC: 8.9 10*3/uL (ref 4.0–10.5)
nRBC: 0 % (ref 0.0–0.2)

## 2023-04-18 LAB — BASIC METABOLIC PANEL
Anion gap: 9 (ref 5–15)
BUN: 18 mg/dL (ref 6–20)
CO2: 23 mmol/L (ref 22–32)
Calcium: 9.2 mg/dL (ref 8.9–10.3)
Chloride: 105 mmol/L (ref 98–111)
Creatinine, Ser: 0.75 mg/dL (ref 0.44–1.00)
GFR, Estimated: >60 mL/min (ref >60)
Glucose, Bld: 93 mg/dL (ref 70–99)
Potassium: 3.6 mmol/L (ref 3.5–5.1)
Sodium: 137 mmol/L (ref 135–145)

## 2023-04-18 LAB — CBG MONITORING, ED: Glucose-Capillary: 86 mg/dL (ref 70–99)

## 2023-04-18 NOTE — ED Triage Notes (Signed)
Pt c/o ongoing lower back pain that worsened today and dizziness since 12pm today. Endorses headache. Denies blurry vision. No unilateral weakness.

## 2023-04-18 NOTE — ED Notes (Signed)
Labeled specimen cup given to pt for U/A collection per MD order. ENMiles 

## 2023-04-19 ENCOUNTER — Ambulatory Visit: Payer: Medicaid Other

## 2023-04-20 ENCOUNTER — Telehealth: Payer: Self-pay

## 2023-04-20 NOTE — Transitions of Care (Post Inpatient/ED Visit) (Cosign Needed)
04/20/2023  Name: Bethany Harris MRN: [id number] DOB: [birth year]  Today's TOC FU Call Status: Today's TOC FU Call Status:: Successful TOC FU Call Competed TOC FU Call Complete Date: 04/20/23  Transition Care Management Follow-up Telephone Call Date of Discharge: 04/19/23 Discharge Facility: [facility]) Type of Discharge: Emergency Department Reason for ED Visit: Respiratory, Other: (headache and back pain) Respiratory Diagnosis: Asthma (Uncontrolled) How have you been since you were released from the hospital?: Better Any questions or concerns?: No  Items Reviewed: Did you receive and understand the discharge instructions provided?: No Medications obtained,verified, and reconciled?: No Medications Not Reviewed Reasons:: Other: Any new allergies since your discharge?: No Dietary orders reviewed?: NA Do you have support at home?: Yes People in Home: other relative(s)  Medications Reviewed Today: Medications Reviewed Today     Reviewed by Renelda Loma, RMA (Registered Medical Assistant) on 04/20/23 at 1539  Med List Status: <None>   Medication Order Taking? Sig Documenting Provider Last Dose Status Informant  cephALEXin (KEFLEX) 500 MG capsule [id number] No Take 1 capsule (500 mg total) by mouth 2 (two) times daily for 10 days.  Patient not taking: Reported on 04/20/2023   Ivonne Andrew, NP Not Taking Active   cyanocobalamin (VITAMIN B12) 1000 MCG tablet [id number] Yes Take 1 tablet (1,000 mcg total) by mouth daily. Ivonne Andrew, NP Taking Active   cyclobenzaprine (FLEXERIL) 10 MG tablet [id number] No Take 0.5-1 tablets (5-10 mg total) by mouth 2 (two) times daily as needed for muscle spasms.  Patient not taking: Reported on 10/26/2022   Arthor Captain, PA-C Not Taking Active   diazepam (VALIUM) 5 MG tablet [id number] No Please take 1.5 hours prior to procedure. May repeat x 1.  Patient not taking: Reported on 02/01/2023   Myra Rude, MD Not Taking Active    dicyclomine (BENTYL) 10 MG capsule [id number] No Take 1 capsule (10 mg total) by mouth 4 (four) times daily -  before meals and at bedtime.  Patient not taking: Reported on 09/05/2022   Bernerd Limbo, CNM Not Taking Active   gabapentin (NEURONTIN) 100 MG capsule [id number] Yes TAKE 1 CAPSULE(100 MG) BY MOUTH THREE TIMES DAILY Myra Rude, MD Taking Active   HYDROcodone-acetaminophen (NORCO/VICODIN) 5-325 MG tablet [id number] Yes Take 1 tablet by mouth every 6 (six) hours as needed. Prosperi, Christian H, PA-C Taking Active   ibuprofen (ADVIL) 600 MG tablet [id number] Yes Take 1 tablet (600 mg total) by mouth every 6 (six) hours as needed. Myra Rude, MD Taking Active   ketoconazole (NIZORAL) 2 % shampoo [id number] No apply two times per week, massage into scalp and leave in for 10 minutes before rinsing out  Patient not taking: Reported on 09/05/2022   Willeen Niece, MD Not Taking Active Self, Pharmacy Records  mirtazapine (REMERON) 7.5 MG tablet [id number] No TAKE 1 TABLET(7.5 MG) BY MOUTH AT BEDTIME  Patient not taking: Reported on 04/13/2023   Ivonne Andrew, NP Not Taking Active   mometasone (ELOCON) 0.1 % cream [id number] No Apply to affected itchy skin qd-bid prn, Avoid applying to face, groin, and axilla. Use as directed. Long-term use can cause thinning of the skin.  Patient not taking: Reported on 09/05/2022   Willeen Niece, MD Not Taking Active Self, Pharmacy Records  mupirocin ointment [facility]) 2 % [id number] No Apply 1 Application topically 2 (two) times daily.  Patient not taking: Reported on 04/20/2023   Ivonne Andrew, NP Not Taking Active   omeprazole (PRILOSEC) 20  MG capsule [id number] Yes TAKE 1 CAPSULE(20 MG) BY MOUTH DAILY Ivonne Andrew, NP Taking Active   ondansetron (ZOFRAN) 4 MG tablet [id number] No Take 1 tablet (4 mg total) by mouth every 8 (eight) hours as needed for nausea or vomiting (DURING MENSEES).  Patient not taking: Reported on 12/13/2022    Lenard Simmer, PA-C Not Taking Active   ondansetron (ZOFRAN) 4 MG tablet [id number] No Take 1 tablet (4 mg total) by mouth every 6 (six) hours.  Patient not taking: Reported on 02/01/2023   Wynetta Fines, MD Not Taking Active   sucralfate (CARAFATE) 1 g tablet [id number] No Take 1 tablet (1 g total) by mouth 4 (four) times daily -  with meals and at bedtime.  Patient not taking: Reported on 02/01/2023   Roxy Horseman, PA-C Not Taking Active   Vitamin D, Ergocalciferol, (DRISDOL) 1.25 MG (50000 UNIT) CAPS capsule [id number] No Take 1 capsule (50,000 Units total) by mouth every 7 (seven) days.  Patient not taking: Reported on 04/13/2023   Ivonne Andrew, NP Not Taking Active             Home Care and Equipment/Supplies: Were Home Health Services Ordered?: NA Any new equipment or medical supplies ordered?: NA  Functional Questionnaire: Do you need assistance with bathing/showering or dressing?: No Do you need assistance with meal preparation?: No Do you need assistance with eating?: No Do you have difficulty maintaining continence: No Do you need assistance with getting out of bed/getting out of a chair/moving?: No Do you have difficulty managing or taking your medications?: No  Follow up appointments reviewed: Date of PCP follow-up appointment?: 04/30/23 Follow-up Provider: Angus Seller Specialist [facility] Follow-up appointment confirmed?: NA Do you need transportation to your follow-up appointment?: No Do you understand care options if your condition(s) worsen?: Yes-patient verbalized understanding    SIGNATURE. Renelda Loma RMA

## 2023-04-24 ENCOUNTER — Ambulatory Visit: Payer: Medicaid Other

## 2023-04-27 ENCOUNTER — Ambulatory Visit: Payer: Self-pay | Admitting: Nurse Practitioner

## 2023-04-30 ENCOUNTER — Inpatient Hospital Stay: Payer: Self-pay | Admitting: Nurse Practitioner

## 2023-05-01 ENCOUNTER — Ambulatory Visit: Payer: Medicaid Other | Attending: Family Medicine

## 2023-05-01 ENCOUNTER — Telehealth: Payer: Self-pay

## 2023-05-01 NOTE — Telephone Encounter (Signed)
Attempted to call patient regarding missed visit, unable to complete call, number rings busy.  1st no-show  Bethany Harris, [place] 05/01/23 6:16 PM

## 2023-05-08 ENCOUNTER — Ambulatory Visit: Payer: Medicaid Other

## 2023-05-10 ENCOUNTER — Ambulatory Visit: Payer: Self-pay | Admitting: Nurse Practitioner

## 2023-05-14 ENCOUNTER — Other Ambulatory Visit: Payer: Self-pay | Admitting: Nurse Practitioner

## 2023-05-14 DIAGNOSIS — R630 Anorexia: Secondary | ICD-10-CM

## 2023-05-14 NOTE — Telephone Encounter (Signed)
Please advise KH 

## 2023-05-15 ENCOUNTER — Ambulatory Visit: Payer: Medicaid Other

## 2023-05-25 ENCOUNTER — Emergency Department: Payer: Medicaid Other

## 2023-05-25 ENCOUNTER — Other Ambulatory Visit: Payer: Self-pay

## 2023-05-25 ENCOUNTER — Emergency Department
Admission: EM | Admit: 2023-05-25 | Discharge: 2023-05-25 | Disposition: A | Discharge status: Home / Self Care | Payer: Medicaid Other | Attending: Emergency Medicine | Admitting: Emergency Medicine

## 2023-05-25 DIAGNOSIS — R1031 Right lower quadrant pain: Secondary | ICD-10-CM | POA: Insufficient documentation

## 2023-05-25 DIAGNOSIS — R55 Syncope and collapse: Secondary | ICD-10-CM | POA: Insufficient documentation

## 2023-05-25 LAB — COMPREHENSIVE METABOLIC PANEL
ALT: 14 U/L (ref 0–44)
AST: 16 U/L (ref 15–41)
Albumin: 4.5 g/dL (ref 3.5–5.0)
Alkaline Phosphatase: 56 U/L (ref 38–126)
Anion gap: 9 (ref 5–15)
BUN: 12 mg/dL (ref 6–20)
CO2: 20 mmol/L — ABNORMAL LOW (ref 22–32)
Calcium: 9.4 mg/dL (ref 8.9–10.3)
Chloride: 112 mmol/L — ABNORMAL HIGH (ref 98–111)
Creatinine, Ser: 0.77 mg/dL (ref 0.44–1.00)
GFR, Estimated: >60 mL/min (ref >60)
Glucose, Bld: 95 mg/dL (ref 70–99)
Potassium: 3.5 mmol/L (ref 3.5–5.1)
Sodium: 141 mmol/L (ref 135–145)
Total Bilirubin: 1 mg/dL (ref 0.3–1.2)
Total Protein: 7.1 g/dL (ref 6.5–8.1)

## 2023-05-25 LAB — URINALYSIS, ROUTINE W REFLEX MICROSCOPIC
Bacteria, UA: NONE SEEN
Bilirubin Urine: NEGATIVE
Glucose, UA: NEGATIVE mg/dL
Ketones, ur: NEGATIVE mg/dL
Leukocytes,Ua: NEGATIVE
Nitrite: NEGATIVE
Protein, ur: NEGATIVE mg/dL
RBC / HPF: >50 RBC/hpf (ref 0–5)
Specific Gravity, Urine: 1.015 (ref 1.005–1.030)
pH: 7 (ref 5.0–8.0)

## 2023-05-25 LAB — HCG, QUANTITATIVE, PREGNANCY: hCG, Beta Chain, Quant, S: <1 m[IU]/mL (ref <5)

## 2023-05-25 LAB — TROPONIN I (HIGH SENSITIVITY)
Troponin I (High Sensitivity): <2 ng/L (ref <18)
Troponin I (High Sensitivity): 3 ng/L (ref <18)

## 2023-05-25 LAB — CBC WITH DIFFERENTIAL/PLATELET
Abs Immature Granulocytes: 0.03 10*3/uL (ref 0.00–0.07)
Basophils Absolute: 0 10*3/uL (ref 0.0–0.1)
Basophils Relative: 0 %
Eosinophils Absolute: 0 10*3/uL (ref 0.0–0.5)
Eosinophils Relative: 0 %
HCT: 38.8 % (ref 36.0–46.0)
Hemoglobin: 13.1 g/dL (ref 12.0–15.0)
Immature Granulocytes: 0 %
Lymphocytes Relative: 13 %
Lymphs Abs: 1.5 10*3/uL (ref 0.7–4.0)
MCH: 28.4 pg (ref 26.0–34.0)
MCHC: 33.8 g/dL (ref 30.0–36.0)
MCV: 84 fL (ref 80.0–100.0)
Monocytes Absolute: 0.6 10*3/uL (ref 0.1–1.0)
Monocytes Relative: 5 %
Neutro Abs: 9.2 10*3/uL — ABNORMAL HIGH (ref 1.7–7.7)
Neutrophils Relative %: 82 %
Platelets: 179 10*3/uL (ref 150–400)
RBC: 4.62 MIL/uL (ref 3.87–5.11)
RDW: 11.7 % (ref 11.5–15.5)
WBC: 11.4 10*3/uL — ABNORMAL HIGH (ref 4.0–10.5)
nRBC: 0 % (ref 0.0–0.2)

## 2023-05-25 LAB — LIPASE, BLOOD: Lipase: 41 U/L (ref 11–51)

## 2023-05-25 MED ORDER — MORPHINE SULFATE (PF) 4 MG/ML IV SOLN
4.0000 mg | Freq: Once | INTRAVENOUS | Status: AC
  Administered 2023-05-25: 4 mg via INTRAVENOUS
  Filled 2023-05-25: qty 1

## 2023-05-25 MED ORDER — EPINEPHRINE 0.3 MG/0.3ML IJ SOAJ
0.3000 mg | Freq: Once | INTRAMUSCULAR | Status: AC
  Administered 2023-05-25: 0.3 mg via INTRAMUSCULAR

## 2023-05-25 MED ORDER — KETOROLAC TROMETHAMINE 30 MG/ML IJ SOLN
15.0000 mg | Freq: Once | INTRAMUSCULAR | Status: AC
  Administered 2023-05-25: 15 mg via INTRAVENOUS
  Filled 2023-05-25: qty 1

## 2023-05-25 MED ORDER — ONDANSETRON HCL 4 MG/2ML IJ SOLN
4.0000 mg | Freq: Once | INTRAMUSCULAR | Status: AC
  Administered 2023-05-25: 4 mg via INTRAVENOUS
  Filled 2023-05-25: qty 2

## 2023-05-25 MED ORDER — IOHEXOL 300 MG/ML  SOLN
70.0000 mL | Freq: Once | INTRAMUSCULAR | Status: AC | PRN
  Administered 2023-05-25: 70 mL via INTRAVENOUS

## 2023-05-25 MED ORDER — LACTATED RINGERS IV BOLUS
1000.0000 mL | Freq: Once | INTRAVENOUS | Status: AC
  Administered 2023-05-25: 1000 mL via INTRAVENOUS

## 2023-05-25 MED ORDER — EPINEPHRINE 0.3 MG/0.3ML IJ SOAJ
INTRAMUSCULAR | Status: AC
  Filled 2023-05-25: qty 0.3

## 2023-05-25 MED ORDER — DIPHENHYDRAMINE HCL 50 MG/ML IJ SOLN
50.0000 mg | Freq: Once | INTRAMUSCULAR | Status: AC
  Administered 2023-05-25: 50 mg via INTRAVENOUS
  Filled 2023-05-25: qty 1

## 2023-05-25 MED ORDER — FAMOTIDINE IN NACL 20-0.9 MG/50ML-% IV SOLN
20.0000 mg | Freq: Once | INTRAVENOUS | Status: AC
  Administered 2023-05-25: 20 mg via INTRAVENOUS
  Filled 2023-05-25: qty 50

## 2023-05-25 MED ORDER — METHYLPREDNISOLONE SODIUM SUCC 125 MG IJ SOLR
125.0000 mg | Freq: Once | INTRAMUSCULAR | Status: AC
  Administered 2023-05-25: 125 mg via INTRAVENOUS
  Filled 2023-05-25: qty 2

## 2023-05-25 NOTE — ED Notes (Signed)
Pt returns from CT c/o of difficulty breathing. Concern for possible allergic reaction to contrast dye. Airway is patent. No rash or hives noted. Pt AxO

## 2023-05-25 NOTE — ED Provider Notes (Signed)
[facility] Provider Note    Event Date/Time   First MD Initiated Contact with Patient 05/25/23 0945     (approximate)   History   Chief Complaint Abdominal Pain and Loss of Consciousness   HPI  Bethany Harris is a 22 y.o. female with past medical history of GERD, GAD, and lumbar radiculopathy who presents to the ED complaining of abdominal pain and syncope.  Patient reports that she has been dealing with increasing pain in the right lower quadrant of her abdomen since last night.  This has been associated with nausea and vomiting, but she denies any diarrhea.  Pain has been sharp and severe, waxing and waning in severity but becoming more severe today while she was at work.  She states she went to the bathroom to vomit when the pain became severe, ended up feeling very lightheaded and passing out.  She continues to have severe pain in the right lower quadrant of her abdomen and her pelvic area.  She states that her menstrual period started 3 to 4 days ago and she has had some heavy bleeding with this, describes changing a pad 3 times so far this morning.  She does occasionally have significant pain with her menstrual period, but it has never been this severe.  She states the pain radiates down into her right leg.  She denies any dysuria, hematuria, or flank pain.     Physical Exam   Triage Vital Signs: ED Triage Vitals  Enc Vitals Group     BP      Pulse      Resp      Temp      Temp src      SpO2      Weight      Height      Head Circumference      Peak Flow      Pain Score      Pain Loc      Pain Edu?      Excl. in GC?     Most recent vital signs: Vitals:   05/25/23 1447 05/25/23 1500  BP:  116/69  Pulse:  99  Resp:  19  Temp: 98.5 F (36.9 C)   SpO2:  100%    Constitutional: Alert and oriented. Eyes: Conjunctivae are normal. Head: Atraumatic. Nose: No congestion/rhinnorhea. Mouth/Throat: Mucous membranes are moist.  Cardiovascular:  Normal rate, regular rhythm. Grossly normal heart sounds.  2+ radial and DP pulses bilaterally. Respiratory: Normal respiratory effort.  No retractions. Lungs CTAB. Gastrointestinal: Soft and tender to palpation in the right lower quadrant with no rebound or guarding. No distention. Musculoskeletal: No lower extremity tenderness nor edema.  Neurologic:  Normal speech and language. No gross focal neurologic deficits are appreciated.    ED Results / Procedures / Treatments   Labs (all labs ordered are listed, but only abnormal results are displayed) Labs Reviewed  CBC WITH DIFFERENTIAL/PLATELET - Abnormal; Notable for the following components:      Result Value   WBC 11.4 (*)    Neutro Abs 9.2 (*)    All other components within normal limits  COMPREHENSIVE METABOLIC PANEL - Abnormal; Notable for the following components:   Chloride 112 (*)    CO2 20 (*)    All other components within normal limits  URINALYSIS, ROUTINE W REFLEX MICROSCOPIC - Abnormal; Notable for the following components:   Color, Urine YELLOW (*)    APPearance CLEAR (*)    Hgb  urine dipstick MODERATE (*)    All other components within normal limits  LIPASE, BLOOD  HCG, QUANTITATIVE, PREGNANCY  POC URINE PREG, ED  TROPONIN I (HIGH SENSITIVITY)  TROPONIN I (HIGH SENSITIVITY)     EKG  ED ECG REPORT I, Chesley Noon, the attending physician, personally viewed and interpreted this ECG.   Date: 05/25/2023  EKG Time: 9:47  Rate: 92  Rhythm: normal sinus rhythm  Axis: Normal  Intervals:none  ST&T Change: None  RADIOLOGY CT abdomen/pelvis reviewed and interpreted by me with no inflammatory changes, focal fluid collections, or dilated bowel loops.  PROCEDURES:  Critical Care performed: No  Procedures   MEDICATIONS ORDERED IN ED: Medications  ketorolac (TORADOL) 30 MG/ML injection 15 mg (has no administration in time range)  morphine (PF) 4 MG/ML injection 4 mg (4 mg Intravenous Given 05/25/23 1023)   ondansetron (ZOFRAN) injection 4 mg (4 mg Intravenous Given 05/25/23 1022)  lactated ringers bolus 1,000 mL (0 mLs Intravenous Stopped 05/25/23 1213)  iohexol (OMNIPAQUE) 300 MG/ML solution 70 mL (70 mLs Intravenous Contrast Given 05/25/23 1038)  EPINEPHrine (EPI-PEN) injection 0.3 mg (0.3 mg Intramuscular Given 05/25/23 1053)  methylPREDNISolone sodium succinate (SOLU-MEDROL) 125 mg/2 mL injection 125 mg (125 mg Intravenous Given 05/25/23 1101)  famotidine (PEPCID) IVPB 20 mg premix (0 mg Intravenous Stopped 05/25/23 1136)  diphenhydrAMINE (BENADRYL) injection 50 mg (50 mg Intravenous Given 05/25/23 1101)     IMPRESSION / MDM / ASSESSMENT AND PLAN / ED COURSE  I reviewed the triage vital signs and the nursing notes.                              22 y.o. female with past medical history of GERD, GAD, and lumbar radiculopathy who presents to the ED complaining of waxing and waning pain in the right lower quadrant of her abdomen since last night associate with nausea and vomiting and an episode of syncope just prior to arrival.  Patient's presentation is most consistent with acute presentation with potential threat to life or bodily function.  Differential diagnosis includes, but is not limited to, anemia, electrolyte abnormality, AKI, ectopic pregnancy, ovarian torsion, ovarian cyst, appendicitis, UTI, kidney stone, lumbar radiculopathy.  Patient uncomfortable appearing but in no acute distress, vital signs are unremarkable.  EKG shows no evidence of arrhythmia or ischemia, patient has prior history of syncope and I suspect vasovagal episode in this case given associated with abdominal pain, nausea, and vomiting.  We will observe on cardiac monitor and screen troponin, additional labs pending at this time.  Pregnancy test pending, will further assess with ultrasound for evidence of torsion as well as CT for possible appendicitis.  We will treat symptomatically with IV morphine and Zofran, hydrate IV  fluids.  CT abdomen/pelvis negative for acute process, pelvic ultrasound also negative for acute pathology or evidence of torsion.  Labs are reassuring with no significant anemia, leukocytosis, tract abnormality, or AKI.  2 sets of troponin are within normal limits and I doubt cardiac etiology for her syncopal episode.  Patient did have some chest tightness and difficulty breathing following administration of IV contrast concerning for possible allergic reaction.  She reported throat tightness and was given IM epinephrine along with IV Solu-Medrol, Pepcid, and Benadryl.  Shortly afterwards, patient had resolution of symptoms, never had any drop in blood pressure.  Patient was observed for over 4 hours with no return of anaphylactic symptoms.  She is appropriate for discharge home  with PCP follow-up, was counseled to return to the ED for new or worsening symptoms.  Patient agrees with plan.      FINAL CLINICAL IMPRESSION(S) / ED DIAGNOSES   Final diagnoses:  Right lower quadrant abdominal pain  Syncope and collapse     Rx / DC Orders   ED Discharge Orders     None        Note:  This document was prepared using Dragon voice recognition software and may include unintentional dictation errors.   Chesley Noon, MD 05/25/23 1539

## 2023-05-25 NOTE — ED Notes (Signed)
Pt reports improved relief of symptoms after epi administration.

## 2023-05-25 NOTE — ED Triage Notes (Signed)
Pt arrives via EMS from her work place. Pt had a syncopal episode while in the restroom at work. Pt states she has has been experiencing lower abdominal pain, nausea, vomiting, lower back pain that radiates down her right leg.Pt reports she is currently on her menstrual cycle and has had similar symptoms and syncopal episodes in the past. Pt AxOx4.

## 2023-05-28 ENCOUNTER — Telehealth: Payer: Self-pay

## 2023-05-28 NOTE — Transitions of Care (Post Inpatient/ED Visit) (Signed)
   05/28/2023  Name: Bethany Harris MRN: [id number] DOB: [birth year]  Today's TOC FU Call Status: Today's TOC FU Call Status:: Unsuccessul Call (1st Attempt) Unsuccessful Call (1st Attempt) Date: 05/28/23  Attempted to reach the patient regarding the most recent Inpatient/ED visit.  Follow Up Plan: Additional outreach attempts will be made to reach the patient to complete the Transitions of Care (Post Inpatient/ED visit) call.   Signature Renelda Loma RMA

## 2023-05-29 ENCOUNTER — Telehealth: Payer: Self-pay

## 2023-05-29 NOTE — Transitions of Care (Post Inpatient/ED Visit) (Signed)
   05/29/2023  Name: Bethany Harris MRN: [id number] DOB: [birth year]  Today's TOC FU Call Status: Today's TOC FU Call Status:: Unsuccessful Call (2nd Attempt) Unsuccessful Call (2nd Attempt) Date: 05/29/23  Attempted to reach the patient regarding the most recent Inpatient/ED visit.  Follow Up Plan: Additional outreach attempts will be made to reach the patient to complete the Transitions of Care (Post Inpatient/ED visit) call.   Signature Renelda Loma RMA

## 2023-05-30 ENCOUNTER — Telehealth: Payer: Self-pay

## 2023-05-30 NOTE — Transitions of Care (Post Inpatient/ED Visit) (Cosign Needed)
   05/30/2023  Name: Bethany Harris MRN: [id number] DOB: 2001-05-20  Today's TOC FU Call Status: Today's TOC FU Call Status:: Successful TOC FU Call Competed Unsuccessful Call (3rd Attempt) Date: 05/30/23  Attempted to reach the patient regarding the most recent Inpatient/ED visit.  Follow Up Plan: No further outreach attempts will be made at this time. We have been unable to contact the patient.  Signature Renelda Loma RMA

## 2023-06-12 ENCOUNTER — Ambulatory Visit: Payer: Medicaid Other | Attending: Family Medicine

## 2023-06-12 DIAGNOSIS — M5459 Other low back pain: Secondary | ICD-10-CM | POA: Diagnosis present

## 2023-06-12 DIAGNOSIS — M62838 Other muscle spasm: Secondary | ICD-10-CM | POA: Diagnosis present

## 2023-06-12 DIAGNOSIS — M6281 Muscle weakness (generalized): Secondary | ICD-10-CM | POA: Insufficient documentation

## 2023-06-12 DIAGNOSIS — R293 Abnormal posture: Secondary | ICD-10-CM | POA: Insufficient documentation
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
## 2023-06-19 ENCOUNTER — Other Ambulatory Visit: Payer: Self-pay

## 2023-06-19 ENCOUNTER — Emergency Department
Admission: EM | Admit: 2023-06-19 | Discharge: 2023-06-19 | Disposition: A | Discharge status: Home / Self Care | Payer: Medicaid Other | Source: Home / Self Care | Attending: Emergency Medicine | Admitting: Emergency Medicine

## 2023-06-19 DIAGNOSIS — M436 Torticollis: Secondary | ICD-10-CM | POA: Insufficient documentation

## 2023-06-19 DIAGNOSIS — M542 Cervicalgia: Secondary | ICD-10-CM | POA: Diagnosis present

## 2023-06-19 MED ORDER — MELOXICAM 15 MG PO TABS: 15.0000 mg | ORAL_TABLET | Freq: Every day | ORAL | 1 refills | Status: AC

## 2023-06-19 MED ORDER — KETOROLAC TROMETHAMINE 15 MG/ML IJ SOLN
15.0000 mg | Freq: Once | INTRAMUSCULAR | Status: AC
  Administered 2023-06-19: 15 mg via INTRAMUSCULAR
  Filled 2023-06-19: qty 1

## 2023-06-19 MED ORDER — METHOCARBAMOL 500 MG PO TABS: 500.0000 mg | ORAL_TABLET | Freq: Three times a day (TID) | ORAL | 0 refills | Status: AC | PRN

## 2023-06-19 MED ORDER — DIAZEPAM 2 MG PO TABS
2.0000 mg | ORAL_TABLET | Freq: Once | ORAL | Status: AC
  Administered 2023-06-19: 2 mg via ORAL
  Filled 2023-06-19: qty 1

## 2023-06-19 NOTE — Discharge Instructions (Addendum)
Take Meloxicam and Robaxin as directed.  

## 2023-06-19 NOTE — ED Triage Notes (Signed)
Pt to ED for right sided neck pain and right shoulder pain since last night. Denies injuries. Ambulatory to triage, NAD noted.

## 2023-06-19 NOTE — ED Notes (Signed)
See triage note  Presents with right sided neck pain  States woke up with pain  Denies any fever or injury

## 2023-06-19 NOTE — ED Provider Notes (Signed)
[facility] Provider Note  Patient Contact: 3:09 PM (approximate)   History   Torticollis   HPI  Bethany Harris is a 22 y.o. female with an unremarkable past medical history, patient presents to the ER with right lateral neck pain that started last night.  Patient reports that pain is reproduced with range of motion at the neck.  No fever, chills or dizziness.  Patient does report that she has some headache.  She denies chest pain, chest tightness or shortness of breath.  No falls or traumas to her knowledge.      Physical Exam   Triage Vital Signs: ED Triage Vitals  Encounter Vitals Group     BP 06/19/23 1325 124/80     Systolic BP Percentile --      Diastolic BP Percentile --      Pulse Rate 06/19/23 1325 (!) 102     Resp 06/19/23 1325 14     Temp 06/19/23 1325 98.1 F (36.7 C)     Temp src --      SpO2 06/19/23 1325 100 %     Weight 06/19/23 1324 99 lb 3.3 oz (45 kg)     Height 06/19/23 1324 5\' 6"  (1.676 m)     Head Circumference --      Peak Flow --      Pain Score 06/19/23 1324 8     Pain Loc --      Pain Education --      Exclude from Growth Chart --     Most recent vital signs: Vitals:   06/19/23 1325  BP: 124/80  Pulse: (!) 102  Resp: 14  Temp: 98.1 F (36.7 C)  SpO2: 100%     General: Alert and in no acute distress. Eyes:  PERRL. EOMI. Head: No acute traumatic findings ENT:      Nose: No congestion/rhinnorhea.      Mouth/Throat: Mucous membranes are moist.  Neck: No stridor. No cervical spine tenderness to palpation.  Patient has tenderness to palpation along the paraspinal muscles along the neck on the right. Cardiovascular:  Good peripheral perfusion Respiratory: Normal respiratory effort without tachypnea or retractions. Lungs CTAB. Good air entry to the bases with no decreased or absent breath sounds. Gastrointestinal: Bowel sounds 4 quadrants. Soft and nontender to palpation. No guarding or rigidity. No palpable  masses. No distention. No CVA tenderness. Musculoskeletal: Full range of motion to all extremities.  Neurologic:  No gross focal neurologic deficits are appreciated.  Skin:   No rash noted    ED Results / Procedures / Treatments   Labs (all labs ordered are listed, but only abnormal results are displayed) Labs Reviewed - No data to display     PROCEDURES:  Critical Care performed: No  Procedures   MEDICATIONS ORDERED IN ED: Medications  ketorolac (TORADOL) 15 MG/ML injection 15 mg (15 mg Intramuscular Given 06/19/23 1538)  diazepam (VALIUM) tablet 2 mg (2 mg Oral Given 06/19/23 1538)     IMPRESSION / MDM / ASSESSMENT AND PLAN / ED COURSE  I reviewed the triage vital signs and the nursing notes.                              Assessment and plan Torticollis 22 year old female presents to the emergency department with right lateral neck pain that started last night without trauma, fever or other constitutional symptoms.  Patient reports headache near muscle pain.  Will give Toradol and Valium and will reassess.  Patient denies possibility of pregnancy.   Patient reported seeing improvement in her symptoms after aforementioned medications were administered.  Patient was discharged with meloxicam and Robaxin.  Return precautions were given to return with new or worsening symptoms.  FINAL CLINICAL IMPRESSION(S) / ED DIAGNOSES   Final diagnoses:  Torticollis     Rx / DC Orders   ED Discharge Orders          Ordered    methocarbamol (ROBAXIN) 500 MG tablet  Every 8 hours PRN        06/19/23 1649    meloxicam (MOBIC) 15 MG tablet  Daily        06/19/23 1649             Note:  This document was prepared using Dragon voice recognition software and may include unintentional dictation errors.   Pia Mau Kiefer, PA-C 06/19/23 1651    Pilar Jarvis, MD 06/19/23 [phone]

## 2023-06-20 ENCOUNTER — Telehealth: Payer: Self-pay

## 2023-06-20 NOTE — Transitions of Care (Post Inpatient/ED Visit) (Cosign Needed)
06/20/2023  Name: Bethany Harris MRN: [id number] DOB: March 19, 2001  Today's TOC FU Call Status: Today's TOC FU Call Status:: Successful TOC FU Call Competed TOC FU Call Complete Date: 06/19/23  Transition Care Management Follow-up Telephone Call Date of Discharge: 06/19/23 Discharge Facility: [facility]) Type of Discharge: Emergency Department Reason for ED Visit: Orthopedic Conditions How have you been since you were released from the hospital?: Same Any questions or concerns?: No  Items Reviewed: Did you receive and understand the discharge instructions provided?: Yes Medications obtained,verified, and reconciled?: Yes (Medications Reviewed) Any new allergies since your discharge?: No Dietary orders reviewed?: NA Do you have support at home?: Yes  Medications Reviewed Today: Medications Reviewed Today     Reviewed by Renelda Loma, RMA (Registered Medical Assistant) on 06/20/23 at [phone]  Med List Status: <None>   Medication Order Taking? Sig Documenting Provider Last Dose Status Informant  cyanocobalamin (VITAMIN B12) 1000 MCG tablet [id number] No Take 1 tablet (1,000 mcg total) by mouth daily.  Patient not taking: Reported on 06/20/2023   Ivonne Andrew, NP Not Taking Active   cyclobenzaprine (FLEXERIL) 10 MG tablet [id number] No Take 0.5-1 tablets (5-10 mg total) by mouth 2 (two) times daily as needed for muscle spasms.  Patient not taking: Reported on 10/26/2022   Arthor Captain, PA-C Not Taking Active   diazepam (VALIUM) 5 MG tablet [id number] No Please take 1.5 hours prior to procedure. May repeat x 1.  Patient not taking: Reported on 02/01/2023   Myra Rude, MD Not Taking Active   dicyclomine (BENTYL) 10 MG capsule [id number] No Take 1 capsule (10 mg total) by mouth 4 (four) times daily -  before meals and at bedtime.  Patient not taking: Reported on 09/05/2022   Bernerd Limbo, CNM Not Taking Active   gabapentin (NEURONTIN) 100 MG  capsule [id number] Yes TAKE 1 CAPSULE(100 MG) BY MOUTH THREE TIMES DAILY Myra Rude, MD Taking Active   HYDROcodone-acetaminophen (NORCO/VICODIN) 5-325 MG tablet [id number] Yes Take 1 tablet by mouth every 6 (six) hours as needed. Prosperi, Christian H, PA-C Taking Active   ibuprofen (ADVIL) 600 MG tablet [id number] Yes Take 1 tablet (600 mg total) by mouth every 6 (six) hours as needed. Myra Rude, MD Taking Active   ketoconazole (NIZORAL) 2 % shampoo [id number] No apply two times per week, massage into scalp and leave in for 10 minutes before rinsing out  Patient not taking: Reported on 09/05/2022   Willeen Niece, MD Not Taking Active Self, Pharmacy Records  meloxicam [facility]) 15 MG tablet [id number] Yes Take 1 tablet (15 mg total) by mouth daily for 7 days. Pia Mau M, PA-C Taking Active   methocarbamol (ROBAXIN) 500 MG tablet [id number] Yes Take 1 tablet (500 mg total) by mouth every 8 (eight) hours as needed for up to 5 days. Pia Mau M, PA-C Taking Active   mirtazapine (REMERON) 7.5 MG tablet [id number] Yes TAKE 1 TABLET(7.5 MG) BY MOUTH AT BEDTIME Ivonne Andrew, NP Taking Active   mometasone (ELOCON) 0.1 % cream [id number] No Apply to affected itchy skin qd-bid prn, Avoid applying to face, groin, and axilla. Use as directed. Long-term use can cause thinning of the skin.  Patient not taking: Reported on 09/05/2022   Willeen Niece, MD Not Taking Active Self, Pharmacy Records  mupirocin ointment [facility]) 2 % [id number] No Apply 1 Application topically 2 (two) times daily.  Patient not taking: Reported on 04/20/2023   Ivonne Andrew, NP Not  Taking Active   omeprazole (PRILOSEC) 20 MG capsule [id number] Yes TAKE 1 CAPSULE(20 MG) BY MOUTH DAILY Ivonne Andrew, NP Taking Active   ondansetron (ZOFRAN) 4 MG tablet [id number] No Take 1 tablet (4 mg total) by mouth every 8 (eight) hours as needed for nausea or vomiting (DURING MENSEES).  Patient not taking: Reported on 12/13/2022    Lenard Simmer, PA-C Not Taking Active   ondansetron (ZOFRAN) 4 MG tablet [id number] No Take 1 tablet (4 mg total) by mouth every 6 (six) hours.  Patient not taking: Reported on 02/01/2023   Wynetta Fines, MD Not Taking Active   sucralfate (CARAFATE) 1 g tablet [id number] No Take 1 tablet (1 g total) by mouth 4 (four) times daily -  with meals and at bedtime.  Patient not taking: Reported on 02/01/2023   Roxy Horseman, PA-C Not Taking Active   Vitamin D, Ergocalciferol, (DRISDOL) 1.25 MG (50000 UNIT) CAPS capsule [id number] No Take 1 capsule (50,000 Units total) by mouth every 7 (seven) days.  Patient not taking: Reported on 04/13/2023   Ivonne Andrew, NP Not Taking Active             Home Care and Equipment/Supplies: Were Home Health Services Ordered?: NA Any new equipment or medical supplies ordered?: NA  Functional Questionnaire: Do you need assistance with bathing/showering or dressing?: No Do you need assistance with meal preparation?: No Do you need assistance with eating?: No Do you have difficulty maintaining continence: No Do you need assistance with getting out of bed/getting out of a chair/moving?: No Do you have difficulty managing or taking your medications?: No  Follow up appointments reviewed: PCP Follow-up appointment confirmed?: Yes Date of PCP follow-up appointment?: 07/04/23 Follow-up Provider: Angus Seller Specialist [facility] Follow-up appointment confirmed?: NA Do you understand care options if your condition(s) worsen?: Yes-patient verbalized understanding    SIGNATURE. Renelda Loma RMA

## 2023-06-24 ENCOUNTER — Emergency Department (HOSPITAL_COMMUNITY): Payer: Medicaid Other

## 2023-06-24 ENCOUNTER — Other Ambulatory Visit: Payer: Self-pay

## 2023-06-24 ENCOUNTER — Emergency Department (HOSPITAL_COMMUNITY)
Admission: EM | Admit: 2023-06-24 | Discharge: 2023-06-24 | Disposition: A | Discharge status: Home / Self Care | Payer: Medicaid Other | Source: Home / Self Care | Attending: Emergency Medicine | Admitting: Emergency Medicine

## 2023-06-24 DIAGNOSIS — R112 Nausea with vomiting, unspecified: Secondary | ICD-10-CM | POA: Insufficient documentation

## 2023-06-24 DIAGNOSIS — R1031 Right lower quadrant pain: Secondary | ICD-10-CM | POA: Insufficient documentation

## 2023-06-24 DIAGNOSIS — R1084 Generalized abdominal pain: Secondary | ICD-10-CM | POA: Diagnosis present

## 2023-06-24 LAB — URINALYSIS, ROUTINE W REFLEX MICROSCOPIC
Bilirubin Urine: NEGATIVE
Glucose, UA: NEGATIVE mg/dL
Ketones, ur: NEGATIVE mg/dL
Nitrite: NEGATIVE
Protein, ur: 100 mg/dL — AB
RBC / HPF: >50 RBC/hpf (ref 0–5)
Specific Gravity, Urine: 1.006 (ref 1.005–1.030)
WBC, UA: >50 WBC/hpf (ref 0–5)
pH: 6 (ref 5.0–8.0)

## 2023-06-24 LAB — COMPREHENSIVE METABOLIC PANEL
ALT: 17 U/L (ref 0–44)
AST: 18 U/L (ref 15–41)
Albumin: 4.2 g/dL (ref 3.5–5.0)
Alkaline Phosphatase: 55 U/L (ref 38–126)
Anion gap: 8 (ref 5–15)
BUN: 13 mg/dL (ref 6–20)
CO2: 21 mmol/L — ABNORMAL LOW (ref 22–32)
Calcium: 9 mg/dL (ref 8.9–10.3)
Chloride: 110 mmol/L (ref 98–111)
Creatinine, Ser: 0.64 mg/dL (ref 0.44–1.00)
GFR, Estimated: >60 mL/min (ref >60)
Glucose, Bld: 99 mg/dL (ref 70–99)
Potassium: 3.7 mmol/L (ref 3.5–5.1)
Sodium: 139 mmol/L (ref 135–145)
Total Bilirubin: 0.8 mg/dL (ref 0.3–1.2)
Total Protein: 6.9 g/dL (ref 6.5–8.1)

## 2023-06-24 LAB — CBC
HCT: 38.6 % (ref 36.0–46.0)
Hemoglobin: 12.8 g/dL (ref 12.0–15.0)
MCH: 28.4 pg (ref 26.0–34.0)
MCHC: 33.2 g/dL (ref 30.0–36.0)
MCV: 85.6 fL (ref 80.0–100.0)
Platelets: 147 10*3/uL — ABNORMAL LOW (ref 150–400)
RBC: 4.51 MIL/uL (ref 3.87–5.11)
RDW: 11.9 % (ref 11.5–15.5)
WBC: 4.8 10*3/uL (ref 4.0–10.5)
nRBC: 0 % (ref 0.0–0.2)

## 2023-06-24 LAB — LIPASE, BLOOD: Lipase: 45 U/L (ref 11–51)

## 2023-06-24 LAB — HCG, SERUM, QUALITATIVE: Preg, Serum: NEGATIVE

## 2023-06-24 MED ORDER — MORPHINE SULFATE (PF) 4 MG/ML IV SOLN
4.0000 mg | Freq: Once | INTRAVENOUS | Status: AC
  Administered 2023-06-24: 4 mg via INTRAVENOUS
  Filled 2023-06-24: qty 1

## 2023-06-24 MED ORDER — METHYLPREDNISOLONE SODIUM SUCC 40 MG IJ SOLR
40.0000 mg | Freq: Once | INTRAMUSCULAR | Status: AC
  Administered 2023-06-24: 40 mg via INTRAVENOUS
  Filled 2023-06-24: qty 1

## 2023-06-24 MED ORDER — SODIUM CHLORIDE 0.9 % IV BOLUS
1000.0000 mL | Freq: Once | INTRAVENOUS | Status: AC
  Administered 2023-06-24: 1000 mL via INTRAVENOUS

## 2023-06-24 MED ORDER — ONDANSETRON HCL 4 MG/2ML IJ SOLN
4.0000 mg | Freq: Once | INTRAMUSCULAR | Status: AC
  Administered 2023-06-24: 4 mg via INTRAVENOUS
  Filled 2023-06-24: qty 2

## 2023-06-24 MED ORDER — DIPHENHYDRAMINE HCL 50 MG/ML IJ SOLN
50.0000 mg | Freq: Once | INTRAMUSCULAR | Status: AC
  Administered 2023-06-24: 50 mg via INTRAVENOUS
  Filled 2023-06-24: qty 1

## 2023-06-24 MED ORDER — SODIUM CHLORIDE (PF) 0.9 % IJ SOLN
INTRAMUSCULAR | Status: AC
  Filled 2023-06-24: qty 50

## 2023-06-24 MED ORDER — IOHEXOL 300 MG/ML  SOLN
80.0000 mL | Freq: Once | INTRAMUSCULAR | Status: AC | PRN
  Administered 2023-06-24: 80 mL via INTRAVENOUS

## 2023-06-24 MED ORDER — DIPHENHYDRAMINE HCL 25 MG PO CAPS: 50.0000 mg | ORAL_CAPSULE | Freq: Once | ORAL | Status: AC

## 2023-06-24 NOTE — Discharge Instructions (Signed)
Please follow-up with your primary care provider regarding recent symptoms and ER visit.  Today your labs and imaging are reassuring and your abdominal pain may be related to your menstrual cycle.  If symptoms change or worsen please return to ER.

## 2023-06-24 NOTE — ED Provider Notes (Signed)
[facility] EMERGENCY DEPARTMENT AT [facility] Provider Note   CSN: [id number] Arrival date & time: 06/24/23  6213     History  Chief Complaint  Patient presents with   Abdominal Pain   Emesis   Nausea    Bethany Harris is a 22 y.o. female present with nausea vomiting abdominal pain that began last night.  Patient dates she had 3 episodes of emesis that were nonbloody last night and that she was having generalized abdominal pain.  Patient states she still has her appendix and gallbladder.  Patient does note that 2 days ago she started her period but that her pain right now is different than her normal cramping.  Patient states that she has felt warm but is unsure of fevers.  Patient denies any vaginal bleeding, chest pain, shortness of breath, change in sensation/motor skills.  Patient states that her abdominal pain is exacerbated with palpation however is unsure what makes her pain better or worse besides that.    Home Medications Prior to Admission medications   Medication Sig Start Date End Date Taking? Authorizing Provider  cyanocobalamin (VITAMIN B12) 1000 MCG tablet Take 1 tablet (1,000 mcg total) by mouth daily. Patient not taking: Reported on 06/20/2023 02/08/23   Ivonne Andrew, NP  cyclobenzaprine (FLEXERIL) 10 MG tablet Take 0.5-1 tablets (5-10 mg total) by mouth 2 (two) times daily as needed for muscle spasms. Patient not taking: Reported on 10/26/2022 09/20/22   Arthor Captain, PA-C  diazepam (VALIUM) 5 MG tablet Please take 1.5 hours prior to procedure. May repeat x 1. Patient not taking: Reported on 02/01/2023 12/19/22   Myra Rude, MD  dicyclomine (BENTYL) 10 MG capsule Take 1 capsule (10 mg total) by mouth 4 (four) times daily -  before meals and at bedtime. Patient not taking: Reported on 09/05/2022 06/01/22   Edd Arbour R, CNM  gabapentin (NEURONTIN) 100 MG capsule TAKE 1 CAPSULE(100 MG) BY MOUTH THREE TIMES DAILY 04/16/23   Myra Rude, MD   HYDROcodone-acetaminophen (NORCO/VICODIN) 5-325 MG tablet Take 1 tablet by mouth every 6 (six) hours as needed. 04/02/23   Prosperi, Christian H, PA-C  ibuprofen (ADVIL) 600 MG tablet Take 1 tablet (600 mg total) by mouth every 6 (six) hours as needed. 11/01/22   Myra Rude, MD  ketoconazole (NIZORAL) 2 % shampoo apply two times per week, massage into scalp and leave in for 10 minutes before rinsing out Patient not taking: Reported on 09/05/2022 05/08/22   Willeen Niece, MD  meloxicam (MOBIC) 15 MG tablet Take 1 tablet (15 mg total) by mouth daily for 7 days. 06/19/23 06/26/23  Orvil Feil, PA-C  methocarbamol (ROBAXIN) 500 MG tablet Take 1 tablet (500 mg total) by mouth every 8 (eight) hours as needed for up to 5 days. 06/19/23 06/24/23  Orvil Feil, PA-C  mirtazapine (REMERON) 7.5 MG tablet TAKE 1 TABLET(7.5 MG) BY MOUTH AT BEDTIME 05/14/23   Ivonne Andrew, NP  mometasone (ELOCON) 0.1 % cream Apply to affected itchy skin qd-bid prn, Avoid applying to face, groin, and axilla. Use as directed. Long-term use can cause thinning of the skin. Patient not taking: Reported on 09/05/2022 05/08/22   Willeen Niece, MD  mupirocin ointment (BACTROBAN) 2 % Apply 1 Application topically 2 (two) times daily. Patient not taking: Reported on 04/20/2023 04/13/23   Ivonne Andrew, NP  omeprazole (PRILOSEC) 20 MG capsule TAKE 1 CAPSULE(20 MG) BY MOUTH DAILY 04/06/23   Ivonne Andrew, NP  ondansetron (ZOFRAN) 4 MG tablet Take 1 tablet (4 mg total) by mouth every 8 (eight) hours as needed for nausea or vomiting (DURING MENSEES). Patient not taking: Reported on 12/13/2022 11/03/22   Melton Alar R, PA-C  ondansetron (ZOFRAN) 4 MG tablet Take 1 tablet (4 mg total) by mouth every 6 (six) hours. Patient not taking: Reported on 02/01/2023 01/05/23   Wynetta Fines, MD  sucralfate (CARAFATE) 1 g tablet Take 1 tablet (1 g total) by mouth 4 (four) times daily -  with meals and at bedtime. Patient not taking: Reported on  02/01/2023 01/06/23   Roxy Horseman, PA-C  Vitamin D, Ergocalciferol, (DRISDOL) 1.25 MG (50000 UNIT) CAPS capsule Take 1 capsule (50,000 Units total) by mouth every 7 (seven) days. Patient not taking: Reported on 04/13/2023 02/08/23   Ivonne Andrew, NP      Allergies    Iohexol    Review of Systems   Review of Systems  Gastrointestinal:  Positive for abdominal pain and vomiting.    Physical Exam Updated Vital Signs BP 117/76   Pulse (!) 102   Temp 98.2 F (36.8 C)   Resp 18   LMP 05/24/2023 Comment: neg test  SpO2 100%  Physical Exam Vitals reviewed.  Constitutional:      General: She is not in acute distress. HENT:     Head: Normocephalic and atraumatic.  Eyes:     Extraocular Movements: Extraocular movements intact.     Conjunctiva/sclera: Conjunctivae normal.     Pupils: Pupils are equal, round, and reactive to light.  Cardiovascular:     Rate and Rhythm: Normal rate and regular rhythm.     Pulses: Normal pulses.     Heart sounds: Normal heart sounds.     Comments: 2+ bilateral radial/dorsalis pedis pulses with regular rate Pulmonary:     Effort: Pulmonary effort is normal. No respiratory distress.     Breath sounds: Normal breath sounds.  Abdominal:     General: There is no distension.     Palpations: Abdomen is soft. There is no mass.     Tenderness: There is no rebound.     Comments: Tenderness and guarding to right lower quadrant  Musculoskeletal:        General: Normal range of motion.     Cervical back: Normal range of motion and neck supple.     Comments: 5 out of 5 bilateral grip/leg extension strength  Skin:    General: Skin is warm and dry.     Capillary Refill: Capillary refill takes less than 2 seconds.  Neurological:     General: No focal deficit present.     Mental Status: She is alert and oriented to person, place, and time.     Comments: Sensation intact in all 4 limbs  Psychiatric:        Mood and Affect: Mood normal.     ED Results /  Procedures / Treatments   Labs (all labs ordered are listed, but only abnormal results are displayed) Labs Reviewed  COMPREHENSIVE METABOLIC PANEL - Abnormal; Notable for the following components:      Result Value   CO2 21 (*)    All other components within normal limits  CBC - Abnormal; Notable for the following components:   Platelets 147 (*)    All other components within normal limits  URINALYSIS, ROUTINE W REFLEX MICROSCOPIC - Abnormal; Notable for the following components:   APPearance CLOUDY (*)    Hgb urine dipstick LARGE (*)  Protein, ur 100 (*)    Leukocytes,Ua TRACE (*)    Bacteria, UA RARE (*)    Crystals PRESENT (*)    All other components within normal limits  LIPASE, BLOOD  HCG, SERUM, QUALITATIVE    EKG None  Radiology CT ABDOMEN PELVIS W CONTRAST  Result Date: 06/24/2023 CLINICAL DATA:  Right lower quadrant abdominal pain. EXAM: CT ABDOMEN AND PELVIS WITH CONTRAST TECHNIQUE: Multidetector CT imaging of the abdomen and pelvis was performed using the standard protocol following bolus administration of intravenous contrast. RADIATION DOSE REDUCTION: This exam was performed according to the departmental dose-optimization program which includes automated exposure control, adjustment of the mA and/or kV according to patient size and/or use of iterative reconstruction technique. CONTRAST:  80mL OMNIPAQUE IOHEXOL 300 MG/ML  SOLN COMPARISON:  May 25, 2023. FINDINGS: Lower chest: No acute abnormality. Hepatobiliary: No focal liver abnormality is seen. No gallstones, gallbladder wall thickening, or biliary dilatation. Pancreas: Unremarkable. No pancreatic ductal dilatation or surrounding inflammatory changes. Spleen: Normal in size without focal abnormality. Adrenals/Urinary Tract: Adrenal glands are unremarkable. Kidneys are normal, without renal calculi, focal lesion, or hydronephrosis. Bladder is unremarkable. Stomach/Bowel: Stomach is within normal limits. Appendix appears  normal. No evidence of bowel wall thickening, distention, or inflammatory changes. Vascular/Lymphatic: No significant vascular findings are present. No enlarged abdominal or pelvic lymph nodes. Reproductive: Uterus and bilateral adnexa are unremarkable. Other: No abdominal wall hernia or abnormality. No abdominopelvic ascites. Musculoskeletal: No acute or significant osseous findings. IMPRESSION: No definite abnormality seen in the abdomen or pelvis. Electronically Signed   By: Lupita Raider M.D.   On: 06/24/2023 15:45   US Pelvis Complete  Result Date: 06/24/2023 CLINICAL DATA:  22 year old female with right lower quadrant pain onset last night. Unknown LMP. EXAM: TRANSABDOMINAL ULTRASOUND OF PELVIS DOPPLER ULTRASOUND OF OVARIES TECHNIQUE: Transabdominal ultrasound examination of the pelvis was performed including evaluation of the uterus, ovaries, adnexal regions, and pelvic cul-de-sac. Color and duplex Doppler ultrasound was utilized to evaluate blood flow to the ovaries. COMPARISON:  CT Abdomen and Pelvis and pelvis ultrasound with Doppler 05/25/2023. FINDINGS: Uterus Measurements: 7.8 x 3.2 x 5.2 cm = volume: 68 mL. No fibroids or other mass visualized. Endometrium Thickness: 6 mm.  No focal abnormality visualized. Right ovary Measurements: 3.2 x 1.9 x 3.8 cm = volume: 12 mL. Normal appearance/no adnexal mass. Left ovary Measurements: 4.1 x 2.3 x 2.3 cm = volume: 11 mL. Normal appearance/no adnexal mass. Pulsed Doppler evaluation demonstrates normal low-resistance arterial and venous waveforms in both ovaries. Other: No pelvis free fluid. IMPRESSION: Normal ovaries and uterus.  Negative for ovarian torsion. Electronically Signed   By: Odessa Fleming M.D.   On: 06/24/2023 12:12   [place] Art/Ven Flow Abd Pelv Doppler  Result Date: 06/24/2023 CLINICAL DATA:  22 year old female with right lower quadrant pain onset last night. Unknown LMP. EXAM: TRANSABDOMINAL ULTRASOUND OF PELVIS DOPPLER ULTRASOUND OF OVARIES  TECHNIQUE: Transabdominal ultrasound examination of the pelvis was performed including evaluation of the uterus, ovaries, adnexal regions, and pelvic cul-de-sac. Color and duplex Doppler ultrasound was utilized to evaluate blood flow to the ovaries. COMPARISON:  CT Abdomen and Pelvis and pelvis ultrasound with Doppler 05/25/2023. FINDINGS: Uterus Measurements: 7.8 x 3.2 x 5.2 cm = volume: 68 mL. No fibroids or other mass visualized. Endometrium Thickness: 6 mm.  No focal abnormality visualized. Right ovary Measurements: 3.2 x 1.9 x 3.8 cm = volume: 12 mL. Normal appearance/no adnexal mass. Left ovary Measurements: 4.1 x 2.3 x 2.3 cm =  volume: 11 mL. Normal appearance/no adnexal mass. Pulsed Doppler evaluation demonstrates normal low-resistance arterial and venous waveforms in both ovaries. Other: No pelvis free fluid. IMPRESSION: Normal ovaries and uterus.  Negative for ovarian torsion. Electronically Signed   By: Odessa Fleming M.D.   On: 06/24/2023 12:12    Procedures Procedures    Medications Ordered in ED Medications  ondansetron (ZOFRAN) injection 4 mg (4 mg Intravenous Given 06/24/23 1053)  morphine (PF) 4 MG/ML injection 4 mg (4 mg Intravenous Given 06/24/23 1053)  sodium chloride 0.9 % bolus 1,000 mL (0 mLs Intravenous Stopped 06/24/23 1245)  methylPREDNISolone sodium succinate (SOLU-MEDROL) 40 mg/mL injection 40 mg (40 mg Intravenous Given 06/24/23 1053)  diphenhydrAMINE (BENADRYL) capsule 50 mg ( Oral See Alternative 06/24/23 1454)    Or  diphenhydrAMINE (BENADRYL) injection 50 mg (50 mg Intravenous Given 06/24/23 1454)  iohexol (OMNIPAQUE) 300 MG/ML solution 80 mL (80 mLs Intravenous Contrast Given 06/24/23 1524)    ED Course/ Medical Decision Making/ A&P                             Medical Decision Making Amount and/or Complexity of Data Reviewed Labs: ordered. Radiology: ordered.  Risk Prescription drug management.   Bethany Harris 22 y.o. presented today for N/V RLQ pain. Working DDx that  I considered at this time includes, but not limited to, gastroenteritis, colitis, small bowel obstruction, appendicitis, cholecystitis, pancreatitis, nephrolithiasis, AAA, UTI, pyelonephritis, ruptured ectopic pregnancy, PID, ovarian torsion.  R/o DDx: gastroenteritis, colitis, small bowel obstruction, appendicitis, cholecystitis, pancreatitis, nephrolithiasis, AAA, UTI, pyelonephritis, ruptured ectopic pregnancy, PID, ovarian torsion: These are considered less likely due to history of present illness and physical exam findings.  Review of prior external notes: 06/19/2023 ED  Unique Tests and My Interpretation:  CBC with differential: Unremarkable CMP: Unremarkable Lipase: Negative UA: Large hemoglobin and crystals present however patient is on her period Serum hCG: Negative EKG: Rate, rhythm, axis, intervals all examined and without medically relevant abnormality. ST segments without concerns for elevations CT Abd/Pelvis with contrast: No acute changes Pelvic ultrasound: No acute changes  Discussion with Independent Historian: None  Discussion of Management of Tests: None  Risk: Medium: prescription drug management  Risk Stratification Score: None  Plan: On exam patient was in no acute distress stable vitals.  On exam patient did have tenderness and guarding to her right lower quadrant and states she still has her appendix.  Patient's labs with triage were ultimately reassuring and patient did not have leukocytosis however given patient's physical exam a CT was ordered.  Patient does have contrast allergy and so patient will be pretreated.  Patient requested pain meds and will be given morphine along with Zofran for continuing nausea and fluids.  Patient's labs and imaging came back reassuring.  At this time I will suspicion of any life-threatening diagnoses and patient stable for discharge.  I encouraged patient to follow-up with her primary care provider and to monitor her symptoms.  I  suspect patient's abdominal pain is most likely related to her menstrual cycle she is currently on her period.  Patient was given return precautions. Patient stable for discharge at this time.  Patient verbalized understanding of plan.         Final Clinical Impression(s) / ED Diagnoses Final diagnoses:  Right lower quadrant abdominal pain    Rx / DC Orders ED Discharge Orders     None         Tahjir Silveria,  Beverly Gust, PA-C 06/24/23 1553    Melene Plan, DO 06/25/23 [phone]

## 2023-06-24 NOTE — ED Triage Notes (Signed)
Pt arrived via POV. C/o abd pain and N/V since last night. AOX4

## 2023-07-04 ENCOUNTER — Ambulatory Visit (INDEPENDENT_AMBULATORY_CARE_PROVIDER_SITE_OTHER): Payer: Medicaid Other | Admitting: Nurse Practitioner

## 2023-07-04 ENCOUNTER — Other Ambulatory Visit: Payer: Self-pay

## 2023-07-04 ENCOUNTER — Encounter: Payer: Self-pay | Admitting: Nurse Practitioner

## 2023-07-04 VITALS — BP 112/76 | HR 110 | Temp 98.5°F | Resp 12 | Ht 63.0 in | Wt 114.0 lb

## 2023-07-04 DIAGNOSIS — N946 Dysmenorrhea, unspecified: Secondary | ICD-10-CM

## 2023-07-04 DIAGNOSIS — G8929 Other chronic pain: Secondary | ICD-10-CM | POA: Diagnosis not present

## 2023-07-04 DIAGNOSIS — R630 Anorexia: Secondary | ICD-10-CM

## 2023-07-04 DIAGNOSIS — M545 Low back pain, unspecified: Secondary | ICD-10-CM | POA: Diagnosis not present

## 2023-07-04 MED ORDER — MELOXICAM 7.5 MG PO TABS: 7.5000 mg | ORAL_TABLET | Freq: Every day | ORAL | 2 refills | Status: DC

## 2023-07-04 MED ORDER — KETOROLAC TROMETHAMINE 30 MG/ML IJ SOLN
30.0000 mg | Freq: Once | INTRAMUSCULAR | Status: AC
Start: 2023-07-04 — End: 2023-07-04
  Administered 2023-07-04: 30 mg via INTRAMUSCULAR

## 2023-07-04 MED ORDER — MIRTAZAPINE 7.5 MG PO TABS
7.5000 mg | ORAL_TABLET | Freq: Every day | ORAL | 0 refills | Status: DC
Start: 2023-07-04 — End: 2024-02-05

## 2023-07-04 MED ORDER — METHOCARBAMOL 500 MG PO TABS: 500.0000 mg | ORAL_TABLET | Freq: Four times a day (QID) | ORAL | 2 refills | Status: DC

## 2023-07-04 NOTE — Telephone Encounter (Signed)
Please advise due to only sending 30 supply

## 2023-07-04 NOTE — Progress Notes (Signed)
@Patient  ID: Bethany Harris, female    DOB: April 27, 2001, 22 y.o.   MRN: [id number]  Chief Complaint  Patient presents with   Hospitalization Follow-up    Referring provider: Ivonne Andrew, NP   HPI  Patient presents today for hospital follow-up.  She was seen in the ED on 06/19/2023 for neck pain and was prescribed Robaxin and Mobic.  She states that this did help but she has run out of the medication.  She states the pain has returned.  She was seen in the ED again on 06/24/2023 for abdominal pain.  This was associated with her menstrual cycle.  Patient states that it has now resolved.  CT scan was negative.  Patient would like a referral to OB/GYN.  Patient will be getting married at the end of August and will be moving to [place].  Denies f/c/s, n/v/d, hemoptysis, PND, leg swelling Denies chest pain or edema       Allergies  Allergen Reactions   Iohexol Shortness Of Breath and Cough    Cough and SOB s/p contrast administration, required Epi, improved post EPI    Immunization History  Administered Date(s) Administered   Influenza,inj,Quad PF,6+ Mos 12/13/2022    Past Medical History:  Diagnosis Date   GERD (gastroesophageal reflux disease)     Tobacco History: Social History   Tobacco Use  Smoking Status Never   Passive exposure: Never  Smokeless Tobacco Never   Counseling given: Not Answered   Outpatient Encounter Medications as of 07/04/2023  Medication Sig   gabapentin (NEURONTIN) 100 MG capsule TAKE 1 CAPSULE(100 MG) BY MOUTH THREE TIMES DAILY   HYDROcodone-acetaminophen (NORCO/VICODIN) 5-325 MG tablet Take 1 tablet by mouth every 6 (six) hours as needed.   ibuprofen (ADVIL) 600 MG tablet Take 1 tablet (600 mg total) by mouth every 6 (six) hours as needed.   meloxicam (MOBIC) 7.5 MG tablet Take 1 tablet (7.5 mg total) by mouth daily.   methocarbamol (ROBAXIN) 500 MG tablet Take 1 tablet (500 mg total) by mouth 4 (four) times daily.   mirtazapine  (REMERON) 7.5 MG tablet TAKE 1 TABLET(7.5 MG) BY MOUTH AT BEDTIME   omeprazole (PRILOSEC) 20 MG capsule TAKE 1 CAPSULE(20 MG) BY MOUTH DAILY   cyanocobalamin (VITAMIN B12) 1000 MCG tablet Take 1 tablet (1,000 mcg total) by mouth daily. (Patient not taking: Reported on 06/20/2023)   cyclobenzaprine (FLEXERIL) 10 MG tablet Take 0.5-1 tablets (5-10 mg total) by mouth 2 (two) times daily as needed for muscle spasms. (Patient not taking: Reported on 10/26/2022)   diazepam (VALIUM) 5 MG tablet Please take 1.5 hours prior to procedure. May repeat x 1. (Patient not taking: Reported on 02/01/2023)   dicyclomine (BENTYL) 10 MG capsule Take 1 capsule (10 mg total) by mouth 4 (four) times daily -  before meals and at bedtime. (Patient not taking: Reported on 09/05/2022)   ketoconazole (NIZORAL) 2 % shampoo apply two times per week, massage into scalp and leave in for 10 minutes before rinsing out (Patient not taking: Reported on 09/05/2022)   mometasone (ELOCON) 0.1 % cream Apply to affected itchy skin qd-bid prn, Avoid applying to face, groin, and axilla. Use as directed. Long-term use can cause thinning of the skin. (Patient not taking: Reported on 09/05/2022)   mupirocin ointment (BACTROBAN) 2 % Apply 1 Application topically 2 (two) times daily. (Patient not taking: Reported on 04/20/2023)   ondansetron (ZOFRAN) 4 MG tablet Take 1 tablet (4 mg total) by mouth every 8 (eight) hours  as needed for nausea or vomiting (DURING MENSEES). (Patient not taking: Reported on 12/13/2022)   ondansetron (ZOFRAN) 4 MG tablet Take 1 tablet (4 mg total) by mouth every 6 (six) hours. (Patient not taking: Reported on 02/01/2023)   sucralfate (CARAFATE) 1 g tablet Take 1 tablet (1 g total) by mouth 4 (four) times daily -  with meals and at bedtime. (Patient not taking: Reported on 02/01/2023)   Vitamin D, Ergocalciferol, (DRISDOL) 1.25 MG (50000 UNIT) CAPS capsule Take 1 capsule (50,000 Units total) by mouth every 7 (seven) days. (Patient not  taking: Reported on 04/13/2023)   [EXPIRED] ketorolac (TORADOL) 30 MG/ML injection 30 mg    No facility-administered encounter medications on file as of 07/04/2023.     Review of Systems  Review of Systems  Constitutional: Negative.   HENT: Negative.    Cardiovascular: Negative.   Gastrointestinal: Negative.   Allergic/Immunologic: Negative.   Neurological: Negative.   Psychiatric/Behavioral: Negative.         Physical Exam  BP 112/76 (BP Location: Right Arm, Patient Position: Sitting, Cuff Size: Normal)   Pulse (!) 110   Temp 98.5 F (36.9 C) (Oral)   Resp 12   Ht 5\' 3"  (1.6 m)   Wt 114 lb (51.7 kg)   LMP 06/23/2023   SpO2 99%   BMI 20.19 kg/m   Wt Readings from Last 5 Encounters:  07/04/23 114 lb (51.7 kg)  06/19/23 99 lb 3.3 oz (45 kg)  05/25/23 100 lb (45.4 kg)  04/18/23 115 lb (52.2 kg)  04/13/23 112 lb 9.6 oz (51.1 kg)     Physical Exam Vitals and nursing note reviewed.  Constitutional:      General: She is not in acute distress.    Appearance: She is well-developed.  Cardiovascular:     Rate and Rhythm: Normal rate and regular rhythm.  Pulmonary:     Effort: Pulmonary effort is normal.     Breath sounds: Normal breath sounds.  Neurological:     Mental Status: She is alert and oriented to person, place, and time.      Lab Results:  CBC    Component Value Date/Time   WBC 4.8 06/24/2023 0908   RBC 4.51 06/24/2023 0908   HGB 12.8 06/24/2023 0908   HGB 13.0 02/07/2023 1206   HCT 38.6 06/24/2023 0908   HCT 40.1 02/07/2023 1206   PLT 147 (L) 06/24/2023 0908   PLT 184 02/07/2023 1206   MCV 85.6 06/24/2023 0908   MCV 87 02/07/2023 1206   MCH 28.4 06/24/2023 0908   MCHC 33.2 06/24/2023 0908   RDW 11.9 06/24/2023 0908   RDW 12.7 02/07/2023 1206   LYMPHSABS 1.5 05/25/2023 0952   MONOABS 0.6 05/25/2023 0952   EOSABS 0.0 05/25/2023 0952   BASOSABS 0.0 05/25/2023 0952    BMET    Component Value Date/Time   NA 139 06/24/2023 0908   NA 141  02/07/2023 1206   K 3.7 06/24/2023 0908   CL 110 06/24/2023 0908   CO2 21 (L) 06/24/2023 0908   GLUCOSE 99 06/24/2023 0908   BUN 13 06/24/2023 0908   BUN 12 02/07/2023 1206   CREATININE 0.64 06/24/2023 0908   CALCIUM 9.0 06/24/2023 0908   GFRNONAA >60 06/24/2023 0908    BNP No results found for: "BNP"  ProBNP No results found for: "PROBNP"  Imaging: CT ABDOMEN PELVIS W CONTRAST  Result Date: 06/24/2023 CLINICAL DATA:  Right lower quadrant abdominal pain. EXAM: CT ABDOMEN AND PELVIS WITH CONTRAST  TECHNIQUE: Multidetector CT imaging of the abdomen and pelvis was performed using the standard protocol following bolus administration of intravenous contrast. RADIATION DOSE REDUCTION: This exam was performed according to the departmental dose-optimization program which includes automated exposure control, adjustment of the mA and/or kV according to patient size and/or use of iterative reconstruction technique. CONTRAST:  80mL OMNIPAQUE IOHEXOL 300 MG/ML  SOLN COMPARISON:  May 25, 2023. FINDINGS: Lower chest: No acute abnormality. Hepatobiliary: No focal liver abnormality is seen. No gallstones, gallbladder wall thickening, or biliary dilatation. Pancreas: Unremarkable. No pancreatic ductal dilatation or surrounding inflammatory changes. Spleen: Normal in size without focal abnormality. Adrenals/Urinary Tract: Adrenal glands are unremarkable. Kidneys are normal, without renal calculi, focal lesion, or hydronephrosis. Bladder is unremarkable. Stomach/Bowel: Stomach is within normal limits. Appendix appears normal. No evidence of bowel wall thickening, distention, or inflammatory changes. Vascular/Lymphatic: No significant vascular findings are present. No enlarged abdominal or pelvic lymph nodes. Reproductive: Uterus and bilateral adnexa are unremarkable. Other: No abdominal wall hernia or abnormality. No abdominopelvic ascites. Musculoskeletal: No acute or significant osseous findings. IMPRESSION: No  definite abnormality seen in the abdomen or pelvis. Electronically Signed   By: Lupita Raider M.D.   On: 06/24/2023 15:45   US Pelvis Complete  Result Date: 06/24/2023 CLINICAL DATA:  22 year old female with right lower quadrant pain onset last night. Unknown LMP. EXAM: TRANSABDOMINAL ULTRASOUND OF PELVIS DOPPLER ULTRASOUND OF OVARIES TECHNIQUE: Transabdominal ultrasound examination of the pelvis was performed including evaluation of the uterus, ovaries, adnexal regions, and pelvic cul-de-sac. Color and duplex Doppler ultrasound was utilized to evaluate blood flow to the ovaries. COMPARISON:  CT Abdomen and Pelvis and pelvis ultrasound with Doppler 05/25/2023. FINDINGS: Uterus Measurements: 7.8 x 3.2 x 5.2 cm = volume: 68 mL. No fibroids or other mass visualized. Endometrium Thickness: 6 mm.  No focal abnormality visualized. Right ovary Measurements: 3.2 x 1.9 x 3.8 cm = volume: 12 mL. Normal appearance/no adnexal mass. Left ovary Measurements: 4.1 x 2.3 x 2.3 cm = volume: 11 mL. Normal appearance/no adnexal mass. Pulsed Doppler evaluation demonstrates normal low-resistance arterial and venous waveforms in both ovaries. Other: No pelvis free fluid. IMPRESSION: Normal ovaries and uterus.  Negative for ovarian torsion. Electronically Signed   By: Odessa Fleming M.D.   On: 06/24/2023 12:12   [place] Art/Ven Flow Abd Pelv Doppler  Result Date: 06/24/2023 CLINICAL DATA:  22 year old female with right lower quadrant pain onset last night. Unknown LMP. EXAM: TRANSABDOMINAL ULTRASOUND OF PELVIS DOPPLER ULTRASOUND OF OVARIES TECHNIQUE: Transabdominal ultrasound examination of the pelvis was performed including evaluation of the uterus, ovaries, adnexal regions, and pelvic cul-de-sac. Color and duplex Doppler ultrasound was utilized to evaluate blood flow to the ovaries. COMPARISON:  CT Abdomen and Pelvis and pelvis ultrasound with Doppler 05/25/2023. FINDINGS: Uterus Measurements: 7.8 x 3.2 x 5.2 cm = volume: 68 mL. No  fibroids or other mass visualized. Endometrium Thickness: 6 mm.  No focal abnormality visualized. Right ovary Measurements: 3.2 x 1.9 x 3.8 cm = volume: 12 mL. Normal appearance/no adnexal mass. Left ovary Measurements: 4.1 x 2.3 x 2.3 cm = volume: 11 mL. Normal appearance/no adnexal mass. Pulsed Doppler evaluation demonstrates normal low-resistance arterial and venous waveforms in both ovaries. Other: No pelvis free fluid. IMPRESSION: Normal ovaries and uterus.  Negative for ovarian torsion. Electronically Signed   By: Odessa Fleming M.D.   On: 06/24/2023 12:12     Assessment & Plan:   Chronic low back pain - ketorolac (TORADOL) 30 MG/ML injection 30 mg -  Ambulatory referral to Obstetrics / Gynecology   Follow up:  Follow up as needed     Ivonne Andrew, NP 07/04/2023

## 2023-07-04 NOTE — Progress Notes (Deleted)
Pt is here for hos f/u   Complaining of right sided neck and right shoulder pain radiating down to her hand   Right sided lower back pain radiating down her right leg

## 2023-07-04 NOTE — Patient Instructions (Signed)
1. Chronic low back pain, unspecified back pain laterality, unspecified whether sciatica present  - ketorolac (TORADOL) 30 MG/ML injection 30 mg - Ambulatory referral to Obstetrics / Gynecology   Follow up:  Follow up as needed

## 2023-07-04 NOTE — Assessment & Plan Note (Signed)
-   ketorolac (TORADOL) 30 MG/ML injection 30 mg - Ambulatory referral to Obstetrics / Gynecology   Follow up:  Follow up as needed

## 2023-07-05 ENCOUNTER — Encounter: Payer: Self-pay | Admitting: Obstetrics and Gynecology

## 2023-07-06 IMAGING — CT CT ABD-PELV W/ CM
2 of 4 series · 15 of 46 positions shown, 17 images · IV contrast (APPLIED)
Comparison: Prior CT of the abdomen and pelvis on 11/04/2021

CLINICAL DATA: Mid abdominal and low back pain.

EXAM:
CT ABDOMEN AND PELVIS WITH CONTRAST
TECHNIQUE: Multidetector CT imaging of the abdomen and pelvis was performed
using the standard protocol following bolus administration of
intravenous contrast.

[Series 2: axial st · axial · 0.68mm/px · z∈[-308,+117]mm · 12 of 95 slices shown, 14 images]
[im 5/95  soft-tissue]
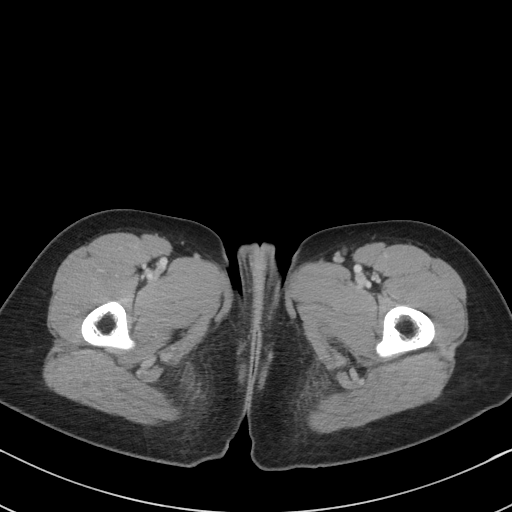
[im 5/95  bone]
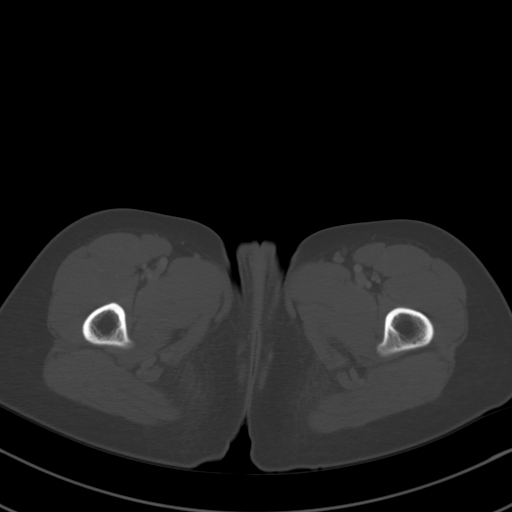
[im 15/95  soft-tissue]
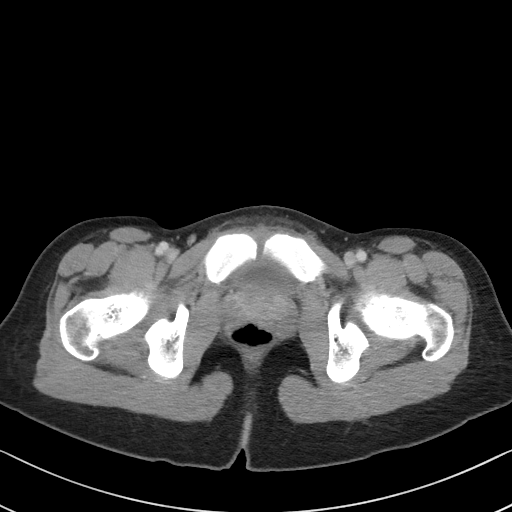
[im 19/95  soft-tissue]
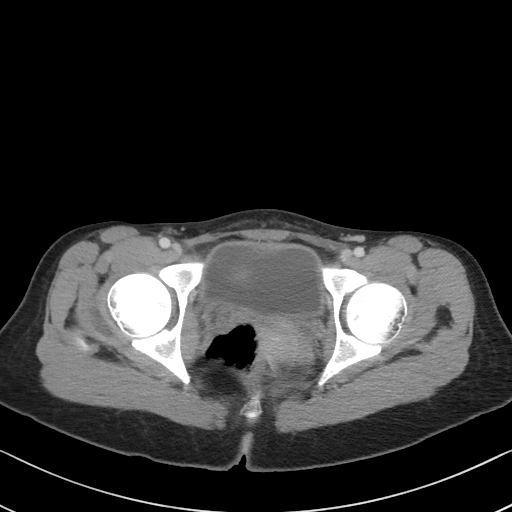
[im 29/95  soft-tissue]
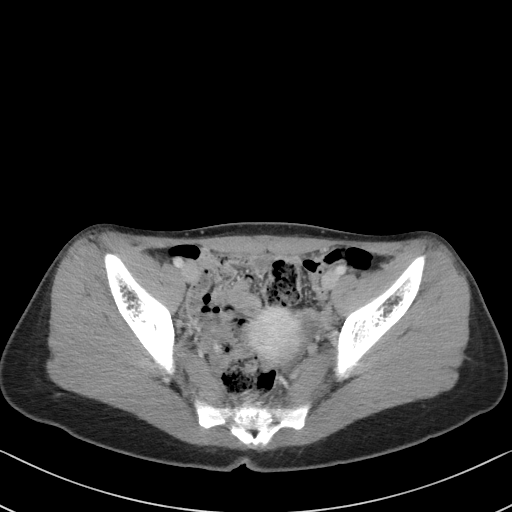
[im 38/95  soft-tissue]
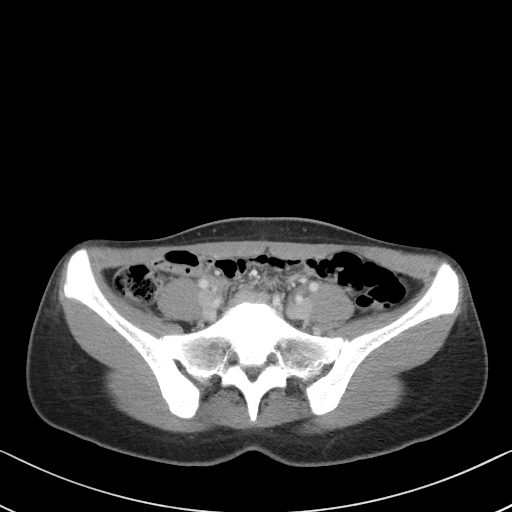
[im 43/95  soft-tissue]
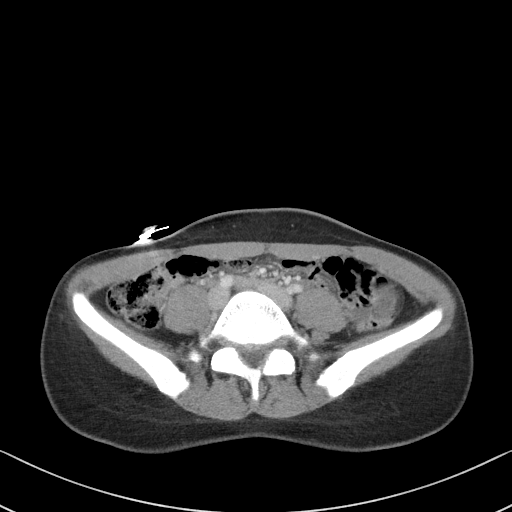
[im 52/95  soft-tissue]
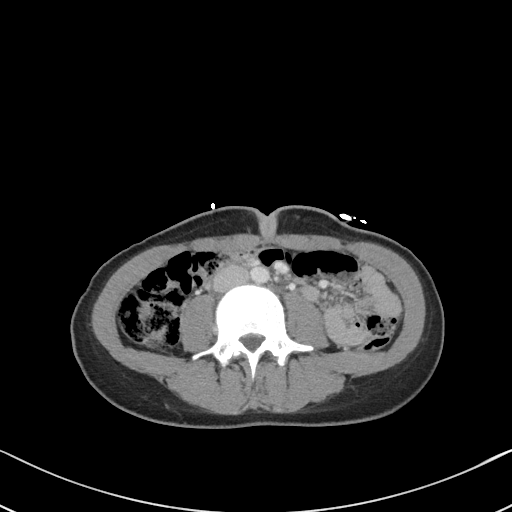
[im 57/95  soft-tissue]
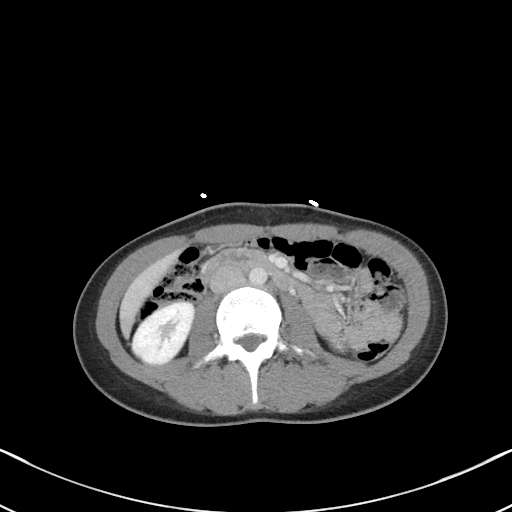
[im 66/95  soft-tissue]
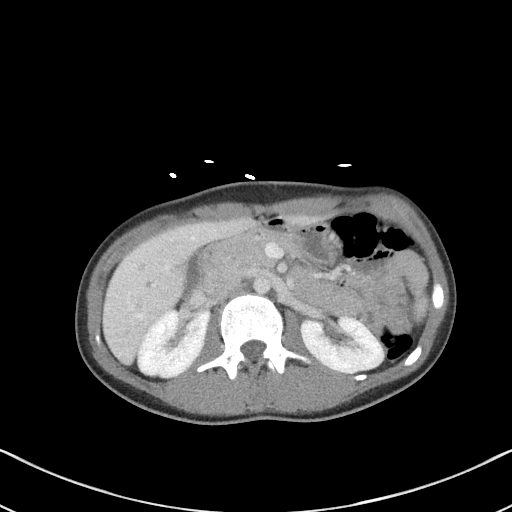
[im 66/95  bone]
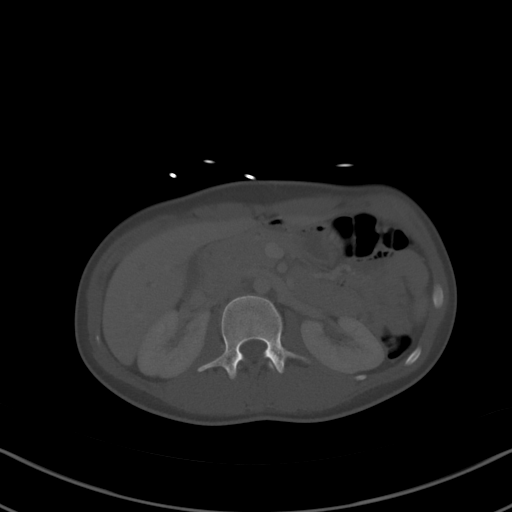
[im 76/95  soft-tissue]
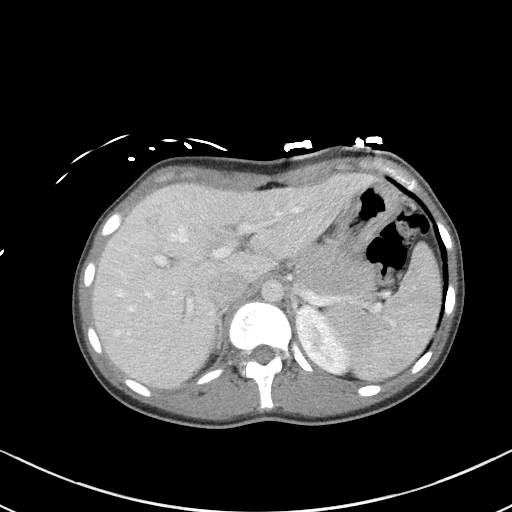
[im 80/95  soft-tissue]
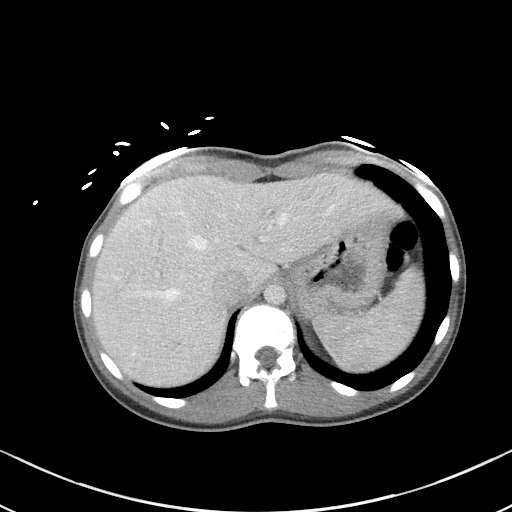
[im 90/95  soft-tissue]
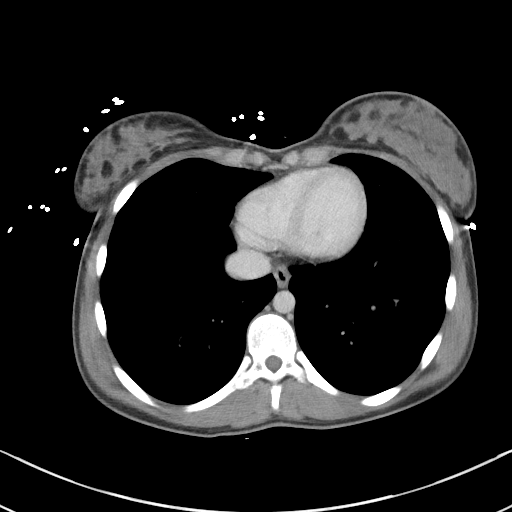

[Series 5: coronal st · coronal · 0.58mm/px · 3 of 61 slices shown]
[im 21/61  soft-tissue]
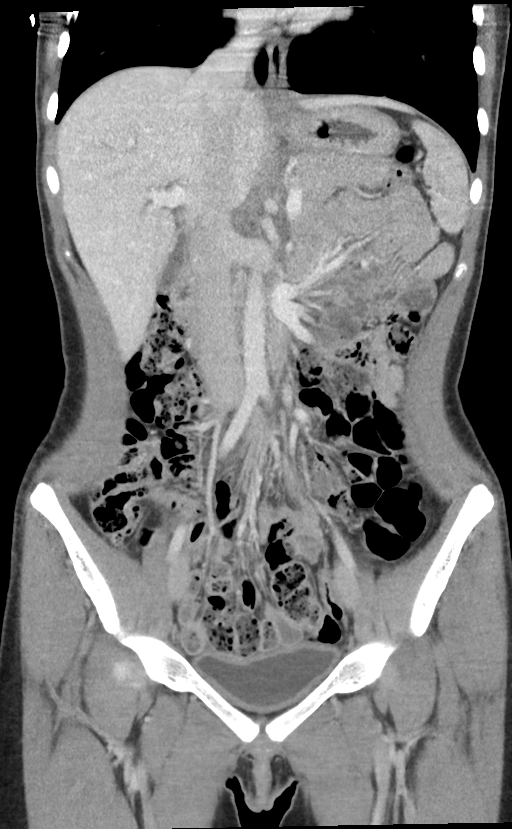
[im 27/61  soft-tissue]
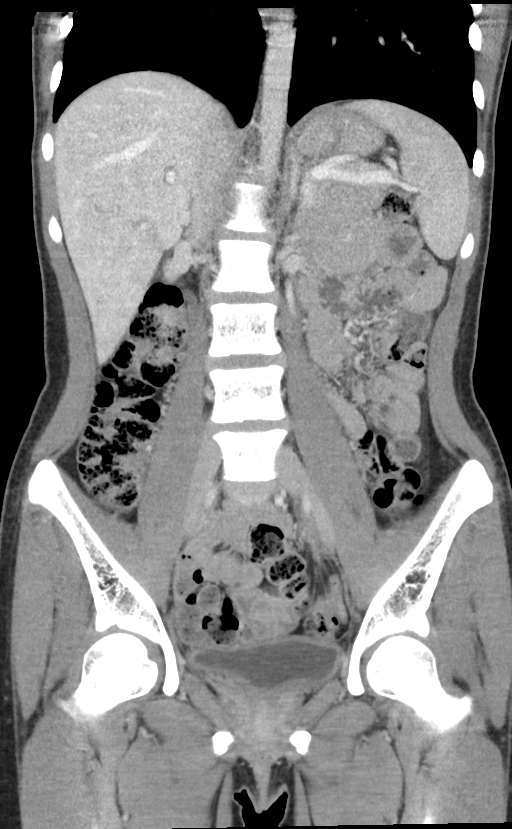
[im 34/61  soft-tissue]
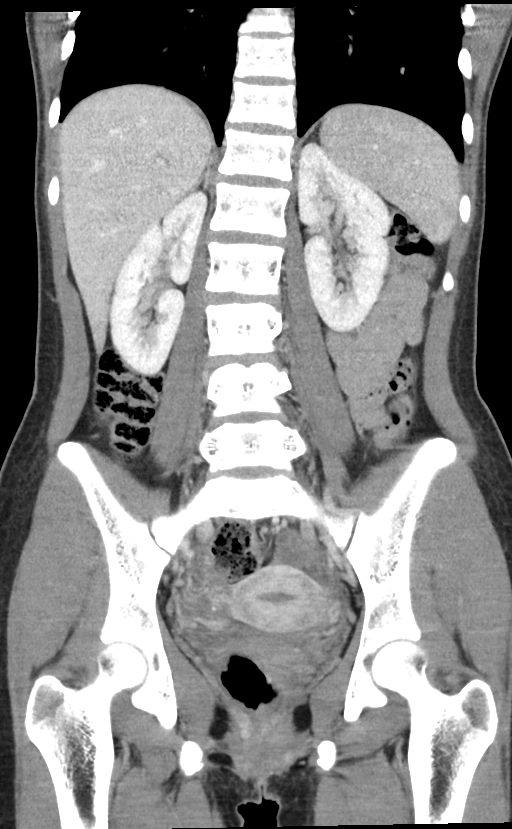

[15 of 46 positions shown; findings below may reference images not displayed]

RADIATION DOSE REDUCTION: This exam was performed according to the
departmental dose-optimization program which includes automated
exposure control, adjustment of the mA and/or kV according to
patient size and/or use of iterative reconstruction technique.

CONTRAST:  80mL OMNIPAQUE IOHEXOL 300 MG/ML  SOLN
FINDINGS: Lower chest: No acute abnormality.

Hepatobiliary: No focal liver abnormality is seen. No gallstones,
gallbladder wall thickening, or biliary dilatation.

Pancreas: Unremarkable. No pancreatic ductal dilatation or
surrounding inflammatory changes.

Spleen: Normal in size without focal abnormality.

Adrenals/Urinary Tract: Adrenal glands are unremarkable. Kidneys are
normal, without renal calculi, focal lesion, or hydronephrosis.
Bladder is unremarkable.

Stomach/Bowel: Bowel shows no evidence of obstruction, ileus,
inflammation or lesion. The appendix is normal. No free
intraperitoneal air.

Vascular/Lymphatic: No significant vascular findings are present. No
enlarged abdominal or pelvic lymph nodes.

Reproductive: There is a mild amount of fluid in the endometrial
cavity. No adnexal masses.

Other: No abdominal wall hernia or abnormality. No abdominopelvic
ascites.

Musculoskeletal: No acute or significant osseous findings.
IMPRESSION: No acute findings in the abdomen or pelvis. Mild amount of fluid in
the endometrial cavity is likely physiologic.

## 2023-07-13 ENCOUNTER — Other Ambulatory Visit: Payer: Self-pay | Admitting: Nurse Practitioner

## 2023-07-13 DIAGNOSIS — K219 Gastro-esophageal reflux disease without esophagitis: Secondary | ICD-10-CM

## 2023-08-23 ENCOUNTER — Encounter: Payer: Medicaid Other | Admitting: Obstetrics and Gynecology

## 2024-02-01 ENCOUNTER — Emergency Department (HOSPITAL_COMMUNITY): Payer: Self-pay

## 2024-02-01 ENCOUNTER — Other Ambulatory Visit: Payer: Self-pay

## 2024-02-01 ENCOUNTER — Emergency Department (HOSPITAL_BASED_OUTPATIENT_CLINIC_OR_DEPARTMENT_OTHER): Payer: Self-pay

## 2024-02-01 ENCOUNTER — Encounter (HOSPITAL_COMMUNITY): Payer: Self-pay | Admitting: Obstetrics and Gynecology

## 2024-02-01 ENCOUNTER — Inpatient Hospital Stay (HOSPITAL_COMMUNITY)
Admission: EM | Admit: 2024-02-01 | Discharge: 2024-02-05 | DRG: 832 | Disposition: A | Discharge status: Home / Self Care | Payer: Self-pay | Attending: Obstetrics and Gynecology | Admitting: Obstetrics and Gynecology

## 2024-02-01 DIAGNOSIS — O9A212 Injury, poisoning and certain other consequences of external causes complicating pregnancy, second trimester: Secondary | ICD-10-CM | POA: Diagnosis present

## 2024-02-01 DIAGNOSIS — Z79899 Other long term (current) drug therapy: Secondary | ICD-10-CM

## 2024-02-01 DIAGNOSIS — Z3A26 26 weeks gestation of pregnancy: Secondary | ICD-10-CM | POA: Diagnosis not present

## 2024-02-01 DIAGNOSIS — S329XXA Fracture of unspecified parts of lumbosacral spine and pelvis, initial encounter for closed fracture: Secondary | ICD-10-CM | POA: Diagnosis not present

## 2024-02-01 DIAGNOSIS — Z3490 Encounter for supervision of normal pregnancy, unspecified, unspecified trimester: Secondary | ICD-10-CM

## 2024-02-01 DIAGNOSIS — M25561 Pain in right knee: Secondary | ICD-10-CM | POA: Diagnosis present

## 2024-02-01 DIAGNOSIS — S32592A Other specified fracture of left pubis, initial encounter for closed fracture: Secondary | ICD-10-CM | POA: Diagnosis present

## 2024-02-01 DIAGNOSIS — Z3A27 27 weeks gestation of pregnancy: Secondary | ICD-10-CM | POA: Diagnosis not present

## 2024-02-01 DIAGNOSIS — Z23 Encounter for immunization: Secondary | ICD-10-CM | POA: Diagnosis not present

## 2024-02-01 DIAGNOSIS — Y92410 Unspecified street and highway as the place of occurrence of the external cause: Secondary | ICD-10-CM

## 2024-02-01 DIAGNOSIS — M25552 Pain in left hip: Secondary | ICD-10-CM | POA: Diagnosis present

## 2024-02-01 DIAGNOSIS — R262 Difficulty in walking, not elsewhere classified: Secondary | ICD-10-CM | POA: Diagnosis present

## 2024-02-01 DIAGNOSIS — S3282XA Multiple fractures of pelvis without disruption of pelvic ring, initial encounter for closed fracture: Secondary | ICD-10-CM

## 2024-02-01 DIAGNOSIS — O26892 Other specified pregnancy related conditions, second trimester: Secondary | ICD-10-CM | POA: Diagnosis present

## 2024-02-01 LAB — CBC
HCT: 31.5 % — ABNORMAL LOW (ref 36.0–46.0)
Hemoglobin: 10.8 g/dL — ABNORMAL LOW (ref 12.0–15.0)
MCH: 30 pg (ref 26.0–34.0)
MCHC: 34.3 g/dL (ref 30.0–36.0)
MCV: 87.5 fL (ref 80.0–100.0)
Platelets: 162 10*3/uL (ref 150–400)
RBC: 3.6 MIL/uL — ABNORMAL LOW (ref 3.87–5.11)
RDW: 12 % (ref 11.5–15.5)
WBC: 18.6 10*3/uL — ABNORMAL HIGH (ref 4.0–10.5)
nRBC: 0 % (ref 0.0–0.2)

## 2024-02-01 LAB — BASIC METABOLIC PANEL
Anion gap: 8 (ref 5–15)
BUN: 9 mg/dL (ref 6–20)
CO2: 19 mmol/L — ABNORMAL LOW (ref 22–32)
Calcium: 8.1 mg/dL — ABNORMAL LOW (ref 8.9–10.3)
Chloride: 109 mmol/L (ref 98–111)
Creatinine, Ser: 0.53 mg/dL (ref 0.44–1.00)
GFR, Estimated: >60 mL/min (ref >60)
Glucose, Bld: 84 mg/dL (ref 70–99)
Potassium: 3.6 mmol/L (ref 3.5–5.1)
Sodium: 136 mmol/L (ref 135–145)

## 2024-02-01 LAB — KLEIHAUER-BETKE STAIN
Fetal Cells %: 0 %
Quantitation Fetal Hemoglobin: 0 mL

## 2024-02-01 LAB — TYPE AND SCREEN
ABO/RH(D): O POS
Antibody Screen: NEGATIVE

## 2024-02-01 MED ORDER — FENTANYL CITRATE PF 50 MCG/ML IJ SOSY
50.0000 ug | PREFILLED_SYRINGE | Freq: Once | INTRAMUSCULAR | Status: AC
  Administered 2024-02-01: 50 ug via INTRAVENOUS

## 2024-02-01 MED ORDER — LACTATED RINGERS IV SOLN
INTRAVENOUS | Status: AC | PRN
Start: 2024-02-01 — End: 2024-02-01
  Administered 2024-02-01: 1000 mL via INTRAVENOUS

## 2024-02-01 MED ORDER — CALCIUM CARBONATE ANTACID 500 MG PO CHEW: 2.0000 | CHEWABLE_TABLET | ORAL | Status: DC | PRN

## 2024-02-01 MED ORDER — FENTANYL CITRATE (PF) 100 MCG/2ML IJ SOLN
100.0000 ug | Freq: Once | INTRAMUSCULAR | Status: AC
  Administered 2024-02-01: 100 ug via INTRAVENOUS
  Filled 2024-02-01: qty 2

## 2024-02-01 MED ORDER — ENOXAPARIN SODIUM 40 MG/0.4ML IJ SOSY
40.0000 mg | PREFILLED_SYRINGE | INTRAMUSCULAR | Status: DC
  Administered 2024-02-05: 40 mg via SUBCUTANEOUS
  Filled 2024-02-01 (×3): qty 0.4

## 2024-02-01 MED ORDER — DOCUSATE SODIUM 100 MG PO CAPS
100.0000 mg | ORAL_CAPSULE | Freq: Every day | ORAL | Status: DC
  Administered 2024-02-04 – 2024-02-05 (×2): 100 mg via ORAL
  Filled 2024-02-01 (×3): qty 1

## 2024-02-01 MED ORDER — ONDANSETRON HCL 4 MG/2ML IJ SOLN
4.0000 mg | Freq: Four times a day (QID) | INTRAMUSCULAR | Status: DC | PRN
  Administered 2024-02-01 – 2024-02-03 (×4): 4 mg via INTRAVENOUS
  Filled 2024-02-01 (×4): qty 2

## 2024-02-01 MED ORDER — FENTANYL CITRATE PF 50 MCG/ML IJ SOSY
50.0000 ug | PREFILLED_SYRINGE | Freq: Once | INTRAMUSCULAR | Status: AC
  Administered 2024-02-01: 50 ug via INTRAVENOUS
  Filled 2024-02-01: qty 1

## 2024-02-01 MED ORDER — SODIUM CHLORIDE 0.9% FLUSH
3.0000 mL | Freq: Two times a day (BID) | INTRAVENOUS | Status: DC
  Administered 2024-02-02 – 2024-02-04 (×5): 10 mL via INTRAVENOUS

## 2024-02-01 MED ORDER — OXYCODONE HCL 5 MG PO TABS
10.0000 mg | ORAL_TABLET | ORAL | Status: DC | PRN
  Administered 2024-02-01 – 2024-02-02 (×4): 10 mg via ORAL
  Filled 2024-02-01 (×4): qty 2

## 2024-02-01 MED ORDER — FENTANYL CITRATE PF 50 MCG/ML IJ SOSY: 50.0000 ug | PREFILLED_SYRINGE | INTRAMUSCULAR | Status: DC | PRN

## 2024-02-01 MED ORDER — SODIUM CHLORIDE 0.9 % IV SOLN
INTRAVENOUS | Status: AC | PRN
Start: 2024-02-01 — End: 2024-02-01
  Administered 2024-02-01: 200 mL via INTRAVENOUS

## 2024-02-01 MED ORDER — SODIUM CHLORIDE 0.9% FLUSH: 3.0000 mL | INTRAVENOUS | Status: DC | PRN

## 2024-02-01 MED ORDER — ACETAMINOPHEN 325 MG PO TABS
650.0000 mg | ORAL_TABLET | ORAL | Status: DC | PRN
  Administered 2024-02-01 – 2024-02-02 (×3): 650 mg via ORAL
  Filled 2024-02-01 (×3): qty 2

## 2024-02-01 MED ORDER — DOXYLAMINE SUCCINATE (SLEEP) 25 MG PO TABS
25.0000 mg | ORAL_TABLET | Freq: Every evening | ORAL | Status: DC | PRN
  Administered 2024-02-01 – 2024-02-04 (×3): 25 mg via ORAL
  Filled 2024-02-01 (×3): qty 1

## 2024-02-01 MED ORDER — PRENATAL MULTIVITAMIN CH
1.0000 | ORAL_TABLET | Freq: Every day | ORAL | Status: DC
Start: 2024-02-02 — End: 2024-02-05
  Administered 2024-02-03 – 2024-02-05 (×3): 1 via ORAL
  Filled 2024-02-01 (×3): qty 1
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
## 2024-02-02 ENCOUNTER — Encounter (HOSPITAL_COMMUNITY): Payer: Self-pay | Admitting: Obstetrics and Gynecology

## 2024-02-02 LAB — ABO/RH: ABO/RH(D): O POS

## 2024-02-02 MED ORDER — FENTANYL CITRATE (PF) 100 MCG/2ML IJ SOLN
50.0000 ug | INTRAMUSCULAR | Status: DC | PRN
  Administered 2024-02-02: 50 ug via INTRAVENOUS
  Filled 2024-02-02: qty 2

## 2024-02-02 MED ORDER — HYDROMORPHONE HCL 1 MG/ML IJ SOLN
0.5000 mg | INTRAMUSCULAR | Status: DC | PRN
  Administered 2024-02-02 – 2024-02-03 (×4): 0.5 mg via INTRAVENOUS
  Filled 2024-02-02 (×4): qty 0.5

## 2024-02-02 MED ORDER — OXYCODONE HCL 5 MG PO TABS
10.0000 mg | ORAL_TABLET | ORAL | Status: DC | PRN
Start: 2024-02-02 — End: 2024-02-05
  Administered 2024-02-02 – 2024-02-05 (×13): 15 mg via ORAL
  Filled 2024-02-02 (×13): qty 3

## 2024-02-02 MED ORDER — ACETAMINOPHEN 500 MG PO TABS
1000.0000 mg | ORAL_TABLET | Freq: Four times a day (QID) | ORAL | Status: DC
Start: 2024-02-02 — End: 2024-02-05
  Administered 2024-02-02 – 2024-02-05 (×13): 1000 mg via ORAL
  Filled 2024-02-02 (×15): qty 2

## 2024-02-02 NOTE — Progress Notes (Signed)
 Toileting offered - requested to wait and had family in room
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
## 2024-02-02 NOTE — Progress Notes (Signed)
 Toileting offered, pt denied

## 2024-02-03 ENCOUNTER — Inpatient Hospital Stay (HOSPITAL_COMMUNITY): Payer: Self-pay

## 2024-02-03 DIAGNOSIS — O9A212 Injury, poisoning and certain other consequences of external causes complicating pregnancy, second trimester: Principal | ICD-10-CM

## 2024-02-03 MED ORDER — LIDOCAINE 5 % EX PTCH
1.0000 | MEDICATED_PATCH | CUTANEOUS | Status: DC
  Administered 2024-02-03 – 2024-02-05 (×3): 1 via TRANSDERMAL
  Filled 2024-02-03 (×3): qty 1

## 2024-02-03 MED ORDER — CYCLOBENZAPRINE HCL 10 MG PO TABS
5.0000 mg | ORAL_TABLET | Freq: Three times a day (TID) | ORAL | Status: DC
  Administered 2024-02-03 – 2024-02-05 (×7): 5 mg via ORAL
  Filled 2024-02-03 (×7): qty 1
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
## 2024-02-03 NOTE — Plan of Care (Signed)
  Problem: Education: Goal: Knowledge of General Education information will improve Description: Including pain rating scale, medication(s)/side effects and non-pharmacologic comfort measures Outcome: Progressing   Problem: Health Behavior/Discharge Planning: Goal: Ability to manage health-related needs will improve Outcome: Progressing   Problem: Clinical Measurements: Goal: Ability to maintain clinical measurements within normal limits will improve Outcome: Progressing Goal: Will remain free from infection Outcome: Progressing Goal: Diagnostic test results will improve Outcome: Progressing Goal: Respiratory complications will improve Outcome: Progressing Goal: Cardiovascular complication will be avoided Outcome: Progressing   Problem: Clinical Measurements: Goal: Will remain free from infection Outcome: Progressing   Problem: Clinical Measurements: Goal: Diagnostic test results will improve Outcome: Progressing
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
## 2024-02-04 DIAGNOSIS — Z3A26 26 weeks gestation of pregnancy: Secondary | ICD-10-CM

## 2024-02-04 DIAGNOSIS — O26892 Other specified pregnancy related conditions, second trimester: Secondary | ICD-10-CM

## 2024-02-04 MED ORDER — TRAMADOL HCL 50 MG PO TABS
50.0000 mg | ORAL_TABLET | Freq: Four times a day (QID) | ORAL | Status: DC
  Administered 2024-02-04 – 2024-02-05 (×5): 50 mg via ORAL
  Filled 2024-02-04 (×5): qty 1

## 2024-02-04 MED ORDER — LACTATED RINGERS IV SOLN: INTRAVENOUS | Status: AC
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
## 2024-02-05 ENCOUNTER — Other Ambulatory Visit (HOSPITAL_COMMUNITY): Payer: Self-pay

## 2024-02-05 ENCOUNTER — Encounter (HOSPITAL_COMMUNITY): Payer: Self-pay | Admitting: Obstetrics and Gynecology

## 2024-02-05 DIAGNOSIS — S329XXA Fracture of unspecified parts of lumbosacral spine and pelvis, initial encounter for closed fracture: Secondary | ICD-10-CM

## 2024-02-05 DIAGNOSIS — Z3A27 27 weeks gestation of pregnancy: Secondary | ICD-10-CM

## 2024-02-05 LAB — VITAMIN D 25 HYDROXY (VIT D DEFICIENCY, FRACTURES): Vit D, 25-Hydroxy: 10.87 ng/mL — ABNORMAL LOW (ref 30–100)

## 2024-02-05 MED ORDER — TETANUS-DIPHTH-ACELL PERTUSSIS 5-2.5-18.5 LF-MCG/0.5 IM SUSY
0.5000 mL | PREFILLED_SYRINGE | Freq: Once | INTRAMUSCULAR | Status: AC
  Administered 2024-02-05: 0.5 mL via INTRAMUSCULAR
  Filled 2024-02-05: qty 0.5

## 2024-02-05 MED ORDER — OXYCODONE HCL 5 MG PO TABS
5.0000 mg | ORAL_TABLET | Freq: Four times a day (QID) | ORAL | 0 refills | Status: DC | PRN
Start: 2024-02-05 — End: 2024-02-16
  Filled 2024-02-05: qty 30, 4d supply, fill #0

## 2024-02-05 MED ORDER — CYCLOBENZAPRINE HCL 5 MG PO TABS
5.0000 mg | ORAL_TABLET | Freq: Three times a day (TID) | ORAL | 0 refills | Status: DC
Start: 2024-02-05 — End: 2024-02-16
  Filled 2024-02-05: qty 30, 10d supply, fill #0

## 2024-02-05 MED ORDER — TRAMADOL HCL 50 MG PO TABS: 50.0000 mg | ORAL_TABLET | Freq: Four times a day (QID) | ORAL | 1 refills | Status: DC

## 2024-02-05 MED ORDER — HEPATITIS B VAC RECOMBINANT 10 MCG/ML IJ SUSP
1.0000 mL | Freq: Once | INTRAMUSCULAR | Status: AC
  Administered 2024-02-05: 10 ug via INTRAMUSCULAR
  Filled 2024-02-05: qty 1

## 2024-02-05 MED ORDER — DOXYLAMINE SUCCINATE (SLEEP) 25 MG PO TABS: 25.0000 mg | ORAL_TABLET | Freq: Every evening | ORAL | 0 refills | Status: DC | PRN

## 2024-02-05 MED ORDER — CYCLOBENZAPRINE HCL 5 MG PO TABS: 5.0000 mg | ORAL_TABLET | Freq: Three times a day (TID) | ORAL | 0 refills | Status: DC

## 2024-02-05 MED ORDER — HEPATITIS B VAC RECOMB ADJ 20 MCG/0.5ML IM SOSY
0.5000 mL | PREFILLED_SYRINGE | Freq: Once | INTRAMUSCULAR | Status: DC
  Filled 2024-02-05: qty 0.5

## 2024-02-05 MED ORDER — TRAMADOL HCL 50 MG PO TABS
50.0000 mg | ORAL_TABLET | Freq: Four times a day (QID) | ORAL | 1 refills | Status: DC
  Filled 2024-02-05: qty 30, 8d supply, fill #0

## 2024-02-05 NOTE — Plan of Care (Signed)
  Problem: Education: Goal: Knowledge of General Education information will improve Description: Including pain rating scale, medication(s)/side effects and non-pharmacologic comfort measures Outcome: Adequate for Discharge   Problem: Health Behavior/Discharge Planning: Goal: Ability to manage health-related needs will improve Outcome: Adequate for Discharge   Problem: Clinical Measurements: Goal: Ability to maintain clinical measurements within normal limits will improve Outcome: Adequate for Discharge Goal: Will remain free from infection Outcome: Adequate for Discharge Goal: Diagnostic test results will improve Outcome: Adequate for Discharge Goal: Respiratory complications will improve Outcome: Adequate for Discharge Goal: Cardiovascular complication will be avoided Outcome: Adequate for Discharge   Problem: Activity: Goal: Risk for activity intolerance will decrease Outcome: Adequate for Discharge   Problem: Nutrition: Goal: Adequate nutrition will be maintained Outcome: Adequate for Discharge   Problem: Coping: Goal: Level of anxiety will decrease Outcome: Adequate for Discharge   Problem: Elimination: Goal: Will not experience complications related to bowel motility Outcome: Adequate for Discharge Goal: Will not experience complications related to urinary retention Outcome: Adequate for Discharge   Problem: Pain Managment: Goal: General experience of comfort will improve and/or be controlled Outcome: Adequate for Discharge   Problem: Safety: Goal: Ability to remain free from injury will improve Outcome: Adequate for Discharge   Problem: Skin Integrity: Goal: Risk for impaired skin integrity will decrease Outcome: Adequate for Discharge   Problem: Education: Goal: Knowledge of disease or condition will improve Outcome: Adequate for Discharge Goal: Knowledge of the prescribed therapeutic regimen will improve Outcome: Adequate for Discharge Goal:  Individualized Educational Video(s) Outcome: Adequate for Discharge   Problem: Clinical Measurements: Goal: Complications related to the disease process, condition or treatment will be avoided or minimized Outcome: Adequate for Discharge
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
## 2024-02-15 ENCOUNTER — Emergency Department (HOSPITAL_COMMUNITY): Payer: MEDICAID

## 2024-02-15 ENCOUNTER — Encounter (HOSPITAL_COMMUNITY): Payer: Self-pay

## 2024-02-15 ENCOUNTER — Observation Stay (HOSPITAL_COMMUNITY)
Admission: EM | Admit: 2024-02-15 | Discharge: 2024-02-16 | DRG: 833 | Disposition: A | Discharge status: Home / Self Care | Payer: MEDICAID | Attending: Family Medicine | Admitting: Family Medicine

## 2024-02-15 DIAGNOSIS — R55 Syncope and collapse: Principal | ICD-10-CM | POA: Diagnosis present

## 2024-02-15 DIAGNOSIS — S329XXA Fracture of unspecified parts of lumbosacral spine and pelvis, initial encounter for closed fracture: Secondary | ICD-10-CM | POA: Diagnosis present

## 2024-02-15 DIAGNOSIS — O9A213 Injury, poisoning and certain other consequences of external causes complicating pregnancy, third trimester: Secondary | ICD-10-CM | POA: Diagnosis present

## 2024-02-15 DIAGNOSIS — O26893 Other specified pregnancy related conditions, third trimester: Secondary | ICD-10-CM | POA: Diagnosis not present

## 2024-02-15 DIAGNOSIS — M25552 Pain in left hip: Secondary | ICD-10-CM | POA: Diagnosis present

## 2024-02-15 DIAGNOSIS — Z91041 Radiographic dye allergy status: Secondary | ICD-10-CM

## 2024-02-15 DIAGNOSIS — R000 Tachycardia, unspecified: Secondary | ICD-10-CM | POA: Diagnosis not present

## 2024-02-15 DIAGNOSIS — R079 Chest pain, unspecified: Secondary | ICD-10-CM | POA: Diagnosis present

## 2024-02-15 DIAGNOSIS — R0602 Shortness of breath: Secondary | ICD-10-CM | POA: Diagnosis present

## 2024-02-15 DIAGNOSIS — O9A212 Injury, poisoning and certain other consequences of external causes complicating pregnancy, second trimester: Secondary | ICD-10-CM | POA: Diagnosis present

## 2024-02-15 DIAGNOSIS — O368330 Maternal care for abnormalities of the fetal heart rate or rhythm, third trimester, not applicable or unspecified: Secondary | ICD-10-CM | POA: Diagnosis not present

## 2024-02-15 DIAGNOSIS — Z3A28 28 weeks gestation of pregnancy: Secondary | ICD-10-CM | POA: Diagnosis not present

## 2024-02-15 DIAGNOSIS — S329XXD Fracture of unspecified parts of lumbosacral spine and pelvis, subsequent encounter for fracture with routine healing: Secondary | ICD-10-CM | POA: Diagnosis not present

## 2024-02-15 DIAGNOSIS — R0989 Other specified symptoms and signs involving the circulatory and respiratory systems: Secondary | ICD-10-CM | POA: Diagnosis present

## 2024-02-15 LAB — CBC
HCT: 32.7 % — ABNORMAL LOW (ref 36.0–46.0)
Hemoglobin: 10.7 g/dL — ABNORMAL LOW (ref 12.0–15.0)
MCH: 28.9 pg (ref 26.0–34.0)
MCHC: 32.7 g/dL (ref 30.0–36.0)
MCV: 88.4 fL (ref 80.0–100.0)
Platelets: 308 10*3/uL (ref 150–400)
RBC: 3.7 MIL/uL — ABNORMAL LOW (ref 3.87–5.11)
RDW: 12.8 % (ref 11.5–15.5)
WBC: 15.8 10*3/uL — ABNORMAL HIGH (ref 4.0–10.5)
nRBC: 0 % (ref 0.0–0.2)

## 2024-02-15 LAB — BASIC METABOLIC PANEL
Anion gap: 8 (ref 5–15)
BUN: 9 mg/dL (ref 6–20)
CO2: 22 mmol/L (ref 22–32)
Calcium: 8.5 mg/dL — ABNORMAL LOW (ref 8.9–10.3)
Chloride: 104 mmol/L (ref 98–111)
Creatinine, Ser: 0.44 mg/dL (ref 0.44–1.00)
GFR, Estimated: >60 mL/min (ref >60)
Glucose, Bld: 75 mg/dL (ref 70–99)
Potassium: 3.4 mmol/L — ABNORMAL LOW (ref 3.5–5.1)
Sodium: 134 mmol/L — ABNORMAL LOW (ref 135–145)

## 2024-02-15 LAB — BRAIN NATRIURETIC PEPTIDE: B Natriuretic Peptide: 100.3 pg/mL — ABNORMAL HIGH (ref 0.0–100.0)

## 2024-02-15 MED ORDER — HEPARIN BOLUS VIA INFUSION
3000.0000 [IU] | Freq: Once | INTRAVENOUS | Status: AC
  Administered 2024-02-16: 3000 [IU] via INTRAVENOUS
  Filled 2024-02-15: qty 3000

## 2024-02-15 MED ORDER — HEPARIN (PORCINE) 25000 UT/250ML-% IV SOLN: 950.0000 [IU]/h | INTRAVENOUS | Status: DC

## 2024-02-15 MED ORDER — HEPARIN BOLUS VIA INFUSION
3000.0000 [IU] | Freq: Once | INTRAVENOUS | Status: DC
  Filled 2024-02-15: qty 3000

## 2024-02-15 MED ORDER — SODIUM CHLORIDE 0.9 % IV BOLUS
1000.0000 mL | Freq: Once | INTRAVENOUS | Status: AC
  Administered 2024-02-15: 1000 mL via INTRAVENOUS

## 2024-02-15 MED ORDER — HEPARIN (PORCINE) 25000 UT/250ML-% IV SOLN: 12.0000 [IU]/kg/h | INTRAVENOUS | Status: DC

## 2024-02-15 MED ORDER — HEPARIN (PORCINE) 25000 UT/250ML-% IV SOLN
950.0000 [IU]/h | INTRAVENOUS | Status: DC
  Administered 2024-02-16: 950 [IU]/h via INTRAVENOUS
  Filled 2024-02-15: qty 250
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
## 2024-02-15 NOTE — ED Notes (Signed)
 [facility], Bethany Harris, is at bedside.

## 2024-02-15 NOTE — ED Notes (Signed)
[facility] Called

## 2024-02-15 NOTE — ED Notes (Signed)
[facility] called  
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
## 2024-02-16 ENCOUNTER — Inpatient Hospital Stay (HOSPITAL_COMMUNITY): Payer: MEDICAID

## 2024-02-16 ENCOUNTER — Other Ambulatory Visit: Payer: Self-pay

## 2024-02-16 ENCOUNTER — Other Ambulatory Visit (HOSPITAL_COMMUNITY): Payer: Self-pay

## 2024-02-16 ENCOUNTER — Encounter (HOSPITAL_COMMUNITY): Payer: Self-pay

## 2024-02-16 DIAGNOSIS — O368330 Maternal care for abnormalities of the fetal heart rate or rhythm, third trimester, not applicable or unspecified: Secondary | ICD-10-CM

## 2024-02-16 DIAGNOSIS — R0989 Other specified symptoms and signs involving the circulatory and respiratory systems: Secondary | ICD-10-CM | POA: Diagnosis not present

## 2024-02-16 DIAGNOSIS — R079 Chest pain, unspecified: Secondary | ICD-10-CM | POA: Diagnosis present

## 2024-02-16 DIAGNOSIS — M25552 Pain in left hip: Secondary | ICD-10-CM | POA: Diagnosis not present

## 2024-02-16 DIAGNOSIS — R0602 Shortness of breath: Secondary | ICD-10-CM | POA: Diagnosis not present

## 2024-02-16 DIAGNOSIS — S329XXD Fracture of unspecified parts of lumbosacral spine and pelvis, subsequent encounter for fracture with routine healing: Secondary | ICD-10-CM | POA: Diagnosis not present

## 2024-02-16 DIAGNOSIS — O26893 Other specified pregnancy related conditions, third trimester: Principal | ICD-10-CM

## 2024-02-16 DIAGNOSIS — Z3A28 28 weeks gestation of pregnancy: Secondary | ICD-10-CM

## 2024-02-16 DIAGNOSIS — Z91041 Radiographic dye allergy status: Secondary | ICD-10-CM | POA: Diagnosis not present

## 2024-02-16 DIAGNOSIS — R000 Tachycardia, unspecified: Secondary | ICD-10-CM | POA: Diagnosis not present

## 2024-02-16 DIAGNOSIS — S329XXA Fracture of unspecified parts of lumbosacral spine and pelvis, initial encounter for closed fracture: Secondary | ICD-10-CM | POA: Diagnosis present

## 2024-02-16 DIAGNOSIS — R55 Syncope and collapse: Secondary | ICD-10-CM | POA: Diagnosis present

## 2024-02-16 DIAGNOSIS — O9A213 Injury, poisoning and certain other consequences of external causes complicating pregnancy, third trimester: Secondary | ICD-10-CM | POA: Diagnosis not present

## 2024-02-16 LAB — TYPE AND SCREEN
ABO/RH(D): O POS
ABO/RH(D): O POS
Antibody Screen: NEGATIVE
Antibody Screen: NEGATIVE

## 2024-02-16 LAB — CBC
HCT: 29.8 % — ABNORMAL LOW (ref 36.0–46.0)
Hemoglobin: 10.1 g/dL — ABNORMAL LOW (ref 12.0–15.0)
MCH: 28.9 pg (ref 26.0–34.0)
MCHC: 33.9 g/dL (ref 30.0–36.0)
MCV: 85.4 fL (ref 80.0–100.0)
Platelets: 278 10*3/uL (ref 150–400)
RBC: 3.49 MIL/uL — ABNORMAL LOW (ref 3.87–5.11)
RDW: 12.8 % (ref 11.5–15.5)
WBC: 14 10*3/uL — ABNORMAL HIGH (ref 4.0–10.5)
nRBC: 0 % (ref 0.0–0.2)

## 2024-02-16 MED ORDER — ACETAMINOPHEN 325 MG PO TABS
650.0000 mg | ORAL_TABLET | ORAL | Status: DC | PRN
  Administered 2024-02-16: 650 mg via ORAL
  Filled 2024-02-16: qty 2

## 2024-02-16 MED ORDER — PRENATAL MULTIVITAMIN CH
1.0000 | ORAL_TABLET | Freq: Every day | ORAL | Status: DC
  Administered 2024-02-16: 1 via ORAL
  Filled 2024-02-16: qty 1

## 2024-02-16 MED ORDER — CYCLOBENZAPRINE HCL 10 MG PO TABS
10.0000 mg | ORAL_TABLET | Freq: Three times a day (TID) | ORAL | 0 refills | Status: DC
  Filled 2024-02-16: qty 30, 10d supply, fill #0

## 2024-02-16 MED ORDER — DIPHENHYDRAMINE HCL 25 MG PO CAPS
50.0000 mg | ORAL_CAPSULE | Freq: Once | ORAL | Status: AC
  Administered 2024-02-16: 50 mg via ORAL
  Filled 2024-02-16: qty 2

## 2024-02-16 MED ORDER — CYCLOBENZAPRINE HCL 10 MG PO TABS
10.0000 mg | ORAL_TABLET | Freq: Three times a day (TID) | ORAL | Status: DC
  Administered 2024-02-16 (×2): 10 mg via ORAL
  Filled 2024-02-16 (×2): qty 1

## 2024-02-16 MED ORDER — SODIUM CHLORIDE 0.9% FLUSH: 3.0000 mL | INTRAVENOUS | Status: DC | PRN

## 2024-02-16 MED ORDER — TRAMADOL HCL 50 MG PO TABS
50.0000 mg | ORAL_TABLET | Freq: Four times a day (QID) | ORAL | 0 refills | Status: DC | PRN
  Filled 2024-02-16: qty 30, 8d supply, fill #0

## 2024-02-16 MED ORDER — ZOLPIDEM TARTRATE 5 MG PO TABS: 5.0000 mg | ORAL_TABLET | Freq: Every evening | ORAL | Status: DC | PRN

## 2024-02-16 MED ORDER — ENOXAPARIN (LOVENOX) PATIENT EDUCATION KIT
PACK | Freq: Once | Status: DC
  Filled 2024-02-16: qty 1

## 2024-02-16 MED ORDER — ENOXAPARIN SODIUM 40 MG/0.4ML IJ SOSY
40.0000 mg | PREFILLED_SYRINGE | INTRAMUSCULAR | 3 refills | Status: DC
  Filled 2024-02-16: qty 12, 30d supply, fill #0
  Filled 2024-03-18: qty 12, 30d supply, fill #1

## 2024-02-16 MED ORDER — DOCUSATE SODIUM 100 MG PO CAPS
100.0000 mg | ORAL_CAPSULE | Freq: Every day | ORAL | Status: DC
  Administered 2024-02-16: 100 mg via ORAL
  Filled 2024-02-16: qty 1

## 2024-02-16 MED ORDER — CYCLOBENZAPRINE HCL 10 MG PO TABS: 5.0000 mg | ORAL_TABLET | Freq: Three times a day (TID) | ORAL | Status: DC

## 2024-02-16 MED ORDER — PANTOPRAZOLE SODIUM 40 MG PO TBEC
40.0000 mg | DELAYED_RELEASE_TABLET | Freq: Every day | ORAL | Status: DC
  Administered 2024-02-16: 40 mg via ORAL
  Filled 2024-02-16: qty 1

## 2024-02-16 MED ORDER — OXYCODONE HCL 5 MG PO TABS: 5.0000 mg | ORAL_TABLET | ORAL | Status: DC | PRN

## 2024-02-16 MED ORDER — SODIUM CHLORIDE 0.9% FLUSH
3.0000 mL | Freq: Two times a day (BID) | INTRAVENOUS | Status: DC
  Administered 2024-02-16 (×2): 10 mL via INTRAVENOUS

## 2024-02-16 MED ORDER — POTASSIUM CHLORIDE CRYS ER 20 MEQ PO TBCR
20.0000 meq | EXTENDED_RELEASE_TABLET | Freq: Every day | ORAL | Status: DC
  Administered 2024-02-16: 20 meq via ORAL
  Filled 2024-02-16: qty 1

## 2024-02-16 MED ORDER — CALCIUM CARBONATE ANTACID 500 MG PO CHEW: 2.0000 | CHEWABLE_TABLET | ORAL | Status: DC | PRN

## 2024-02-16 MED ORDER — ENOXAPARIN SODIUM 40 MG/0.4ML IJ SOSY
40.0000 mg | PREFILLED_SYRINGE | INTRAMUSCULAR | Status: DC
  Administered 2024-02-16: 40 mg via SUBCUTANEOUS
  Filled 2024-02-16: qty 0.4

## 2024-02-16 MED ORDER — IOHEXOL 350 MG/ML SOLN
75.0000 mL | Freq: Once | INTRAVENOUS | Status: AC | PRN
  Administered 2024-02-16: 75 mL via INTRAVENOUS

## 2024-02-16 MED ORDER — ACETAMINOPHEN 500 MG PO TABS: 1000.0000 mg | ORAL_TABLET | Freq: Four times a day (QID) | ORAL | Status: DC

## 2024-02-16 MED ORDER — DIPHENHYDRAMINE HCL 50 MG/ML IJ SOLN: 50.0000 mg | Freq: Once | INTRAMUSCULAR | Status: AC

## 2024-02-16 MED ORDER — METHYLPREDNISOLONE SODIUM SUCC 125 MG IJ SOLR
40.0000 mg | Freq: Once | INTRAMUSCULAR | Status: AC
  Administered 2024-02-16: 40 mg via INTRAVENOUS
  Filled 2024-02-16: qty 2

## 2024-02-16 MED ORDER — ACETAMINOPHEN 500 MG PO TABS
1000.0000 mg | ORAL_TABLET | Freq: Four times a day (QID) | ORAL | 0 refills | Status: DC
  Filled 2024-02-16: qty 30, 4d supply, fill #0

## 2024-02-16 MED ORDER — OXYCODONE HCL 5 MG PO TABS
5.0000 mg | ORAL_TABLET | ORAL | 0 refills | Status: DC | PRN
  Filled 2024-02-16: qty 30, 5d supply, fill #0

## 2024-02-16 MED ORDER — OXYCODONE HCL 5 MG PO TABS
10.0000 mg | ORAL_TABLET | ORAL | Status: DC | PRN
  Administered 2024-02-16: 10 mg via ORAL
  Filled 2024-02-16: qty 2

## 2024-02-16 NOTE — H&P (Signed)
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 and electrolytes except for mild hypokalemia, oral repletion ordered Routine antenatal care   Jaynie Collins, MD, FACOG Attending Obstetrician & Gynecologist Faculty Practice, [facility] - [facility]

## 2024-02-16 NOTE — Plan of Care (Signed)
  Problem: Education: Goal: Knowledge of disease or condition will improve 02/16/2024 1631 by Earlyne Iba, RN Outcome: Completed/Met 02/16/2024 0948 by Earlyne Iba, RN Outcome: Progressing Goal: Knowledge of the prescribed therapeutic regimen will improve 02/16/2024 1631 by Earlyne Iba, RN Outcome: Completed/Met 02/16/2024 0948 by Earlyne Iba, RN Outcome: Progressing Goal: Individualized Educational Video(s) 02/16/2024 1631 by Earlyne Iba, RN Outcome: Completed/Met 02/16/2024 0948 by Earlyne Iba, RN Outcome: Progressing   Problem: Clinical Measurements: Goal: Complications related to the disease process, condition or treatment will be avoided or minimized 02/16/2024 1631 by Earlyne Iba, RN Outcome: Completed/Met 02/16/2024 0948 by Earlyne Iba, RN Outcome: Progressing   Problem: Education: Goal: Knowledge of General Education information will improve Description: Including pain rating scale, medication(s)/side effects and non-pharmacologic comfort measures 02/16/2024 1631 by Earlyne Iba, RN Outcome: Completed/Met 02/16/2024 0948 by Earlyne Iba, RN Outcome: Progressing   Problem: Health Behavior/Discharge Planning: Goal: Ability to manage health-related needs will improve 02/16/2024 1631 by Earlyne Iba, RN Outcome: Completed/Met 02/16/2024 0948 by Earlyne Iba, RN Outcome: Progressing   Problem: Clinical Measurements: Goal: Ability to maintain clinical measurements within normal limits will improve 02/16/2024 1631 by Earlyne Iba, RN Outcome: Completed/Met 02/16/2024 0948 by Earlyne Iba, RN Outcome: Progressing Goal: Will remain free from infection 02/16/2024 1631 by Earlyne Iba, RN Outcome: Completed/Met 02/16/2024 0948 by Earlyne Iba, RN Outcome: Progressing Goal: Diagnostic test results will improve 02/16/2024 1631 by Earlyne Iba, RN Outcome:  Completed/Met 02/16/2024 0948 by Earlyne Iba, RN Outcome: Progressing Goal: Respiratory complications will improve 02/16/2024 1631 by Earlyne Iba, RN Outcome: Completed/Met 02/16/2024 0948 by Earlyne Iba, RN Outcome: Progressing Goal: Cardiovascular complication will be avoided 02/16/2024 1631 by Earlyne Iba, RN Outcome: Completed/Met 02/16/2024 0948 by Earlyne Iba, RN Outcome: Progressing   Problem: Activity: Goal: Risk for activity intolerance will decrease 02/16/2024 1631 by Earlyne Iba, RN Outcome: Completed/Met 02/16/2024 0948 by Earlyne Iba, RN Outcome: Progressing   Problem: Nutrition: Goal: Adequate nutrition will be maintained 02/16/2024 1631 by Earlyne Iba, RN Outcome: Completed/Met 02/16/2024 0948 by Earlyne Iba, RN Outcome: Progressing   Problem: Coping: Goal: Level of anxiety will decrease 02/16/2024 1631 by Earlyne Iba, RN Outcome: Completed/Met 02/16/2024 0948 by Earlyne Iba, RN Outcome: Progressing   Problem: Elimination: Goal: Will not experience complications related to bowel motility 02/16/2024 1631 by Earlyne Iba, RN Outcome: Completed/Met 02/16/2024 0948 by Earlyne Iba, RN Outcome: Progressing Goal: Will not experience complications related to urinary retention 02/16/2024 1631 by Earlyne Iba, RN Outcome: Completed/Met 02/16/2024 0948 by Earlyne Iba, RN Outcome: Progressing   Problem: Pain Managment: Goal: General experience of comfort will improve and/or be controlled 02/16/2024 1631 by Earlyne Iba, RN Outcome: Completed/Met 02/16/2024 0948 by Earlyne Iba, RN Outcome: Progressing   Problem: Safety: Goal: Ability to remain free from injury will improve 02/16/2024 1631 by Earlyne Iba, RN Outcome: Completed/Met 02/16/2024 0948 by Earlyne Iba, RN Outcome: Progressing   Problem: Skin Integrity: Goal: Risk for impaired skin  integrity will decrease 02/16/2024 1631 by Earlyne Iba, RN Outcome: Completed/Met 02/16/2024 0948 by Earlyne Iba, RN Outcome: Progressing
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
## 2024-02-16 NOTE — Progress Notes (Signed)
 Results Addendum  CT Angio Chest Pulmonary Embolism (PE) W or WO Contrast Result Date: 02/16/2024 CLINICAL DATA:  Loss of consciousness. EXAM: CT ANGIOGRAPHY CHEST WITH CONTRAST TECHNIQUE: Multidetector CT imaging of the chest was performed using the standard protocol during bolus administration of intravenous contrast. Multiplanar CT image reconstructions and MIPs were obtained to evaluate the vascular anatomy. RADIATION DOSE REDUCTION: This exam was performed according to the departmental dose-optimization program which includes automated exposure control, adjustment of the mA and/or kV according to patient size and/or use of iterative reconstruction technique. CONTRAST:  75mL OMNIPAQUE IOHEXOL 350 MG/ML SOLN COMPARISON:  Standard chest CT 02/01/2024 FINDINGS: Cardiovascular: The heart size is normal. No substantial pericardial effusion. No thoracic aortic aneurysm. No substantial atherosclerosis of the thoracic aorta. Prominent pulmonary arterial anatomy with pulmonary outflow tract measuring 2.5 cm diameter. There is no filling defect within the opacified pulmonary arteries to suggest the presence of an acute pulmonary embolus. Mediastinum/Nodes: No mediastinal lymphadenopathy. There is no hilar lymphadenopathy. The esophagus has normal imaging features. There is no axillary lymphadenopathy. Lungs/Pleura: No suspicious pulmonary nodule or mass. No focal airspace consolidation. There is no evidence of pleural effusion. Upper Abdomen: Visualized portion of the upper abdomen shows no acute findings. Musculoskeletal: No worrisome lytic or sclerotic osseous abnormality. Review of the MIP images confirms the above findings. IMPRESSION: 1. No CT evidence for acute pulmonary embolus. 2. No acute findings in the chest. Electronically Signed   By: Kennith Center M.D.   On: 02/16/2024 06:15   DG Chest Portable 1 View Result Date: 02/15/2024 CLINICAL DATA:  Shortness of breath. EXAM: PORTABLE CHEST 1 VIEW COMPARISON:   Radiograph and CT 02/01/2024 FINDINGS: The cardiomediastinal contours are normal. The lungs are clear. Pulmonary vasculature is normal. No consolidation, pleural effusion, or pneumothorax. No acute osseous abnormalities are seen. IMPRESSION: No active disease. Electronically Signed   By: Narda Rutherford M.D.   On: 02/15/2024 23:16   CT results negative for PE, discussed with patient. Heparin drip discontinued, will transition to prophylactic Lovenox given increased VTE risk 2/2 her immobility due to pain from pelvic fracture and growing pregnancy. Pharmacy contacted to help with transition. This was discussed with patient, emphasized need to continue this until her mobility improves. No signs of DVT noted on examination. Continue analgesia as needed, likely discharge later today.   Jaynie Collins, MD

## 2024-02-16 NOTE — Plan of Care (Signed)

## 2024-02-16 NOTE — Progress Notes (Signed)
 Pt. Refused to administer Lovenox injection.  Lovenox instructions discussed with patient and spouse.  He stated " I know how to do this"  Precautions discussed with both as well as bleeding risk.  Both state understanding.

## 2024-02-17 ENCOUNTER — Inpatient Hospital Stay (HOSPITAL_COMMUNITY)
Admission: AD | Admit: 2024-02-17 | Discharge: 2024-02-18 | Disposition: A | Discharge status: Home / Self Care | Payer: Self-pay | Attending: Family Medicine | Admitting: Family Medicine

## 2024-02-17 ENCOUNTER — Encounter (HOSPITAL_COMMUNITY): Payer: Self-pay | Admitting: Family Medicine

## 2024-02-17 ENCOUNTER — Other Ambulatory Visit: Payer: Self-pay

## 2024-02-17 ENCOUNTER — Telehealth: Payer: Self-pay

## 2024-02-17 DIAGNOSIS — R079 Chest pain, unspecified: Secondary | ICD-10-CM | POA: Insufficient documentation

## 2024-02-17 DIAGNOSIS — Z3A28 28 weeks gestation of pregnancy: Secondary | ICD-10-CM | POA: Insufficient documentation

## 2024-02-17 DIAGNOSIS — O26893 Other specified pregnancy related conditions, third trimester: Secondary | ICD-10-CM | POA: Insufficient documentation

## 2024-02-17 DIAGNOSIS — O368130 Decreased fetal movements, third trimester, not applicable or unspecified: Secondary | ICD-10-CM | POA: Insufficient documentation

## 2024-02-17 DIAGNOSIS — O99891 Other specified diseases and conditions complicating pregnancy: Secondary | ICD-10-CM

## 2024-02-17 DIAGNOSIS — G8929 Other chronic pain: Secondary | ICD-10-CM | POA: Insufficient documentation

## 2024-02-17 DIAGNOSIS — M549 Dorsalgia, unspecified: Secondary | ICD-10-CM | POA: Insufficient documentation

## 2024-02-17 NOTE — Telephone Encounter (Signed)
 Patient was recently on OB specialty, she stated that she was missing two prescriptions ommeprozole and sleeping pill. She was given Benadryl in the hospital.  Instructed her that both of these medications are over the counter and can be purchased at any pharamcy

## 2024-02-17 NOTE — MAU Note (Signed)
.  Bethany Harris is a 23 y.o. at [redacted]w[redacted]d here in MAU reporting: headache, dizziness, visual changes (floaters), left leg pain, difficulty catching her breath, and pain with urination today, abdominal pain. Patient was discharged from [facility] on 02/16/24 and was told to report back to MAU if her symptoms returned or worsened. Patient states she has been taking her oxycodone as prescribed for pain but it causes drowsiness and so she has only had 2 bottles of water to drink today but normally has 10-11/day. She is also experiencing new numbness in her hands and feet. Patient states baby has not been moving as much as usual. Denies contractions, LOF, or VB. No other complaints.    Onset of complaint: 02/17/24 Pain score: 9/10  Vitals:   02/17/24 2331  BP: 113/61  Pulse: (!) 130  Resp: 18  Temp: 98 F (36.7 C)  SpO2: 99%     FHT: 150   Lab orders placed from triage: UA
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
## 2024-02-18 ENCOUNTER — Inpatient Hospital Stay (HOSPITAL_COMMUNITY): Payer: Self-pay

## 2024-02-18 DIAGNOSIS — R079 Chest pain, unspecified: Secondary | ICD-10-CM | POA: Diagnosis not present

## 2024-02-18 DIAGNOSIS — Z3A28 28 weeks gestation of pregnancy: Secondary | ICD-10-CM

## 2024-02-18 DIAGNOSIS — O26893 Other specified pregnancy related conditions, third trimester: Secondary | ICD-10-CM | POA: Diagnosis not present

## 2024-02-18 DIAGNOSIS — G8929 Other chronic pain: Secondary | ICD-10-CM | POA: Diagnosis not present

## 2024-02-18 DIAGNOSIS — O99891 Other specified diseases and conditions complicating pregnancy: Secondary | ICD-10-CM

## 2024-02-18 DIAGNOSIS — M549 Dorsalgia, unspecified: Secondary | ICD-10-CM | POA: Diagnosis not present

## 2024-02-18 DIAGNOSIS — O368130 Decreased fetal movements, third trimester, not applicable or unspecified: Secondary | ICD-10-CM | POA: Diagnosis present

## 2024-02-18 LAB — CBC
HCT: 31.1 % — ABNORMAL LOW (ref 36.0–46.0)
Hemoglobin: 10.1 g/dL — ABNORMAL LOW (ref 12.0–15.0)
MCH: 28.6 pg (ref 26.0–34.0)
MCHC: 32.5 g/dL (ref 30.0–36.0)
MCV: 88.1 fL (ref 80.0–100.0)
Platelets: 314 10*3/uL (ref 150–400)
RBC: 3.53 MIL/uL — ABNORMAL LOW (ref 3.87–5.11)
RDW: 12.9 % (ref 11.5–15.5)
WBC: 15.1 10*3/uL — ABNORMAL HIGH (ref 4.0–10.5)
nRBC: 0 % (ref 0.0–0.2)

## 2024-02-18 LAB — BRAIN NATRIURETIC PEPTIDE: B Natriuretic Peptide: 64.9 pg/mL (ref 0.0–100.0)

## 2024-02-18 LAB — COMPREHENSIVE METABOLIC PANEL
ALT: 18 U/L (ref 0–44)
AST: 20 U/L (ref 15–41)
Albumin: 2.7 g/dL — ABNORMAL LOW (ref 3.5–5.0)
Alkaline Phosphatase: 120 U/L (ref 38–126)
Anion gap: 7 (ref 5–15)
BUN: 7 mg/dL (ref 6–20)
CO2: 21 mmol/L — ABNORMAL LOW (ref 22–32)
Calcium: 8.6 mg/dL — ABNORMAL LOW (ref 8.9–10.3)
Chloride: 109 mmol/L (ref 98–111)
Creatinine, Ser: 0.5 mg/dL (ref 0.44–1.00)
GFR, Estimated: >60 mL/min (ref >60)
Glucose, Bld: 92 mg/dL (ref 70–99)
Potassium: 3.5 mmol/L (ref 3.5–5.1)
Sodium: 137 mmol/L (ref 135–145)
Total Bilirubin: 0.4 mg/dL (ref 0.0–1.2)
Total Protein: 5.8 g/dL — ABNORMAL LOW (ref 6.5–8.1)

## 2024-02-18 LAB — TROPONIN I (HIGH SENSITIVITY): Troponin I (High Sensitivity): 5 ng/L (ref <18)

## 2024-02-18 LAB — TSH: TSH: 2.261 u[IU]/mL (ref 0.350–4.500)

## 2024-02-18 MED ORDER — LACTATED RINGERS IV BOLUS
1000.0000 mL | Freq: Once | INTRAVENOUS | Status: AC
  Administered 2024-02-18: 1000 mL via INTRAVENOUS

## 2024-02-18 NOTE — Discharge Instructions (Signed)
 You were seen in the MAU for chest pain and other issues. We did an extensive range of tests and did not find anything abnormal. We recommend you continue taking your medicines as prescribed, attend physical therapy, and go to your regular OB appointments.

## 2024-02-20 ENCOUNTER — Other Ambulatory Visit: Payer: Self-pay

## 2024-02-20 ENCOUNTER — Other Ambulatory Visit (HOSPITAL_COMMUNITY)
Admission: RE | Admit: 2024-02-20 | Discharge: 2024-02-20 | Disposition: A | Discharge status: Home / Self Care | Payer: Self-pay | Source: Ambulatory Visit | Attending: Certified Nurse Midwife | Admitting: Certified Nurse Midwife

## 2024-02-20 ENCOUNTER — Ambulatory Visit (INDEPENDENT_AMBULATORY_CARE_PROVIDER_SITE_OTHER): Payer: Self-pay | Admitting: Family Medicine

## 2024-02-20 ENCOUNTER — Encounter: Payer: Self-pay | Admitting: Certified Nurse Midwife

## 2024-02-20 VITALS — BP 106/70 | HR 120 | Wt 136.3 lb

## 2024-02-20 DIAGNOSIS — Z3A30 30 weeks gestation of pregnancy: Secondary | ICD-10-CM | POA: Diagnosis not present

## 2024-02-20 DIAGNOSIS — O0990 Supervision of high risk pregnancy, unspecified, unspecified trimester: Secondary | ICD-10-CM | POA: Insufficient documentation

## 2024-02-20 DIAGNOSIS — O0993 Supervision of high risk pregnancy, unspecified, third trimester: Secondary | ICD-10-CM | POA: Diagnosis not present

## 2024-02-20 DIAGNOSIS — S3282XA Multiple fractures of pelvis without disruption of pelvic ring, initial encounter for closed fracture: Secondary | ICD-10-CM

## 2024-02-20 DIAGNOSIS — N898 Other specified noninflammatory disorders of vagina: Secondary | ICD-10-CM | POA: Diagnosis present

## 2024-02-20 DIAGNOSIS — Z758 Other problems related to medical facilities and other health care: Secondary | ICD-10-CM

## 2024-02-20 DIAGNOSIS — Z603 Acculturation difficulty: Secondary | ICD-10-CM
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
## 2024-02-21 ENCOUNTER — Encounter: Payer: Self-pay | Admitting: Family Medicine

## 2024-02-21 LAB — CERVICOVAGINAL ANCILLARY ONLY
Bacterial Vaginitis (gardnerella): NEGATIVE
Candida Glabrata: NEGATIVE
Candida Vaginitis: POSITIVE — AB
Chlamydia: NEGATIVE
Comment: NEGATIVE
Comment: NEGATIVE
Comment: NEGATIVE
Comment: NEGATIVE
Comment: NEGATIVE
Comment: NORMAL
Neisseria Gonorrhea: NEGATIVE
Trichomonas: NEGATIVE

## 2024-02-21 MED ORDER — FLUCONAZOLE 150 MG PO TABS: 150.0000 mg | ORAL_TABLET | Freq: Once | ORAL | 0 refills | Status: AC

## 2024-02-22 LAB — CBC/D/PLT+RPR+RH+ABO+RUBIGG...
Antibody Screen: NEGATIVE
Basophils Absolute: 0.1 10*3/uL (ref 0.0–0.2)
Basos: 1 %
EOS (ABSOLUTE): 0.2 10*3/uL (ref 0.0–0.4)
Eos: 1 %
HCV Ab: NONREACTIVE
HIV Screen 4th Generation wRfx: NONREACTIVE
Hematocrit: 32.3 % — ABNORMAL LOW (ref 34.0–46.6)
Hemoglobin: 11 g/dL — ABNORMAL LOW (ref 11.1–15.9)
Hepatitis B Surface Ag: NEGATIVE
Immature Grans (Abs): 0.7 10*3/uL — ABNORMAL HIGH (ref 0.0–0.1)
Immature Granulocytes: 5 %
Lymphocytes Absolute: 1.9 10*3/uL (ref 0.7–3.1)
Lymphs: 11 %
MCH: 28.9 pg (ref 26.6–33.0)
MCHC: 34.1 g/dL (ref 31.5–35.7)
MCV: 85 fL (ref 79–97)
Monocytes Absolute: 1.2 10*3/uL — ABNORMAL HIGH (ref 0.1–0.9)
Monocytes: 7 %
Neutrophils Absolute: 12.6 10*3/uL — ABNORMAL HIGH (ref 1.4–7.0)
Neutrophils: 75 %
Platelets: 278 10*3/uL (ref 150–450)
RBC: 3.8 x10E6/uL (ref 3.77–5.28)
RDW: 12.3 % (ref 11.7–15.4)
RPR Ser Ql: NONREACTIVE
Rh Factor: POSITIVE
Rubella Antibodies, IGG: 18.8 {index} (ref >0.99)
WBC: 16.6 10*3/uL — ABNORMAL HIGH (ref 3.4–10.8)

## 2024-02-22 LAB — HEMOGLOBIN A1C
Est. average glucose Bld gHb Est-mCnc: 94 mg/dL
Hgb A1c MFr Bld: 4.9 % (ref 4.8–5.6)

## 2024-02-22 LAB — URINE CULTURE, OB REFLEX

## 2024-02-22 LAB — HCV INTERPRETATION

## 2024-02-22 LAB — GLUCOSE TOLERANCE, 1 HOUR: Glucose, 1Hr PP: 79 mg/dL (ref 70–199)

## 2024-02-22 LAB — CULTURE, OB URINE

## 2024-02-28 LAB — PANORAMA PRENATAL TEST FULL PANEL:PANORAMA TEST PLUS 5 ADDITIONAL MICRODELETIONS: FETAL FRACTION: 12.7

## 2024-02-28 LAB — HORIZON CUSTOM: REPORT SUMMARY: NEGATIVE

## 2024-03-05 ENCOUNTER — Other Ambulatory Visit: Payer: Self-pay

## 2024-03-05 ENCOUNTER — Ambulatory Visit (INDEPENDENT_AMBULATORY_CARE_PROVIDER_SITE_OTHER): Payer: Self-pay | Admitting: Obstetrics and Gynecology

## 2024-03-05 VITALS — BP 99/65 | HR 126

## 2024-03-05 DIAGNOSIS — Z3A32 32 weeks gestation of pregnancy: Secondary | ICD-10-CM | POA: Diagnosis not present

## 2024-03-05 DIAGNOSIS — S329XXD Fracture of unspecified parts of lumbosacral spine and pelvis, subsequent encounter for fracture with routine healing: Secondary | ICD-10-CM | POA: Diagnosis not present

## 2024-03-05 DIAGNOSIS — O0993 Supervision of high risk pregnancy, unspecified, third trimester: Secondary | ICD-10-CM | POA: Diagnosis not present

## 2024-03-05 DIAGNOSIS — O0990 Supervision of high risk pregnancy, unspecified, unspecified trimester: Secondary | ICD-10-CM

## 2024-03-05 MED ORDER — PREPLUS 27-1 MG PO TABS: 1.0000 | ORAL_TABLET | Freq: Every day | ORAL | 13 refills | Status: DC

## 2024-03-05 MED ORDER — OXYCODONE HCL 5 MG PO TABS: 5.0000 mg | ORAL_TABLET | ORAL | 0 refills | Status: DC | PRN
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
## 2024-03-06 ENCOUNTER — Inpatient Hospital Stay (HOSPITAL_COMMUNITY)
Admission: AD | Admit: 2024-03-06 | Discharge: 2024-03-07 | Disposition: A | Discharge status: Home / Self Care | Payer: MEDICAID | Attending: Obstetrics & Gynecology | Admitting: Obstetrics & Gynecology

## 2024-03-06 ENCOUNTER — Inpatient Hospital Stay (HOSPITAL_COMMUNITY): Payer: MEDICAID

## 2024-03-06 ENCOUNTER — Encounter (HOSPITAL_COMMUNITY): Payer: Self-pay | Admitting: Obstetrics & Gynecology

## 2024-03-06 DIAGNOSIS — Z3689 Encounter for other specified antenatal screening: Secondary | ICD-10-CM

## 2024-03-06 DIAGNOSIS — R079 Chest pain, unspecified: Secondary | ICD-10-CM | POA: Diagnosis not present

## 2024-03-06 DIAGNOSIS — O26893 Other specified pregnancy related conditions, third trimester: Secondary | ICD-10-CM | POA: Insufficient documentation

## 2024-03-06 DIAGNOSIS — Z3A32 32 weeks gestation of pregnancy: Secondary | ICD-10-CM | POA: Diagnosis not present

## 2024-03-06 DIAGNOSIS — O99891 Other specified diseases and conditions complicating pregnancy: Secondary | ICD-10-CM

## 2024-03-06 DIAGNOSIS — R0602 Shortness of breath: Secondary | ICD-10-CM | POA: Diagnosis not present

## 2024-03-06 LAB — COMPREHENSIVE METABOLIC PANEL WITH GFR
ALT: 15 U/L (ref 0–44)
AST: 19 U/L (ref 15–41)
Albumin: 2.8 g/dL — ABNORMAL LOW (ref 3.5–5.0)
Alkaline Phosphatase: 110 U/L (ref 38–126)
Anion gap: 10 (ref 5–15)
BUN: 6 mg/dL (ref 6–20)
CO2: 18 mmol/L — ABNORMAL LOW (ref 22–32)
Calcium: 8.8 mg/dL — ABNORMAL LOW (ref 8.9–10.3)
Chloride: 108 mmol/L (ref 98–111)
Creatinine, Ser: 0.44 mg/dL (ref 0.44–1.00)
GFR, Estimated: >60 mL/min (ref >60)
Glucose, Bld: 107 mg/dL — ABNORMAL HIGH (ref 70–99)
Potassium: 3.5 mmol/L (ref 3.5–5.1)
Sodium: 136 mmol/L (ref 135–145)
Total Bilirubin: 0.2 mg/dL (ref 0.0–1.2)
Total Protein: 5.9 g/dL — ABNORMAL LOW (ref 6.5–8.1)

## 2024-03-06 LAB — CBC WITH DIFFERENTIAL/PLATELET
Abs Immature Granulocytes: 0.83 K/uL — ABNORMAL HIGH (ref 0.00–0.07)
Basophils Absolute: 0.1 K/uL (ref 0.0–0.1)
Basophils Relative: 0 %
Eosinophils Absolute: 0.4 K/uL (ref 0.0–0.5)
Eosinophils Relative: 2 %
HCT: 29.9 % — ABNORMAL LOW (ref 36.0–46.0)
Hemoglobin: 10.4 g/dL — ABNORMAL LOW (ref 12.0–15.0)
Immature Granulocytes: 4 %
Lymphocytes Relative: 14 %
Lymphs Abs: 3 K/uL (ref 0.7–4.0)
MCH: 28.2 pg (ref 26.0–34.0)
MCHC: 34.8 g/dL (ref 30.0–36.0)
MCV: 81 fL (ref 80.0–100.0)
Monocytes Absolute: 1.7 K/uL — ABNORMAL HIGH (ref 0.1–1.0)
Monocytes Relative: 8 %
Neutro Abs: 14.9 K/uL — ABNORMAL HIGH (ref 1.7–7.7)
Neutrophils Relative %: 72 %
Platelets: 185 K/uL (ref 150–400)
RBC: 3.69 MIL/uL — ABNORMAL LOW (ref 3.87–5.11)
RDW: 12.3 % (ref 11.5–15.5)
WBC: 20.8 K/uL — ABNORMAL HIGH (ref 4.0–10.5)
nRBC: 0 % (ref 0.0–0.2)

## 2024-03-06 LAB — BRAIN NATRIURETIC PEPTIDE: B Natriuretic Peptide: 20 pg/mL (ref 0.0–100.0)

## 2024-03-06 MED ORDER — CYCLOBENZAPRINE HCL 5 MG PO TABS
10.0000 mg | ORAL_TABLET | Freq: Once | ORAL | Status: AC
  Administered 2024-03-07: 10 mg via ORAL
  Filled 2024-03-06: qty 2

## 2024-03-06 MED ORDER — ACETAMINOPHEN 325 MG PO TABS
650.0000 mg | ORAL_TABLET | Freq: Once | ORAL | Status: AC
  Administered 2024-03-07: 650 mg via ORAL
  Filled 2024-03-06: qty 2

## 2024-03-06 NOTE — MAU Provider Note (Signed)
 History     CSN: [id number]  Arrival date and time: 03/06/24 2226   Event Date/Time   First Provider Initiated Contact with Patient 03/06/24 2355      Chief Complaint  Patient presents with   Abdominal Pain   Shortness of Breath   Headache   Chest Pain   Dizziness   Bethany Harris is a 23 y.o. G1P0 at [redacted]w[redacted]d who receives care at [facility].  She reports her next appt is April 28. She presents today for SOB. She states the symptoms started around 4 or 5pm. She states she was not doing any activity and did not note any relieving or worsening factors. She reports current chest pain that also started this evening.  She is unable to describe the pain, but rates the pain a 9/10.  OB History     Gravida  1   Para  0   Term  0   Preterm  0   AB  0   Living  0      SAB  0   IAB  0   Ectopic  0   Multiple  0   Live Births  0           Past Medical History:  Diagnosis Date   GERD (gastroesophageal reflux disease)     Past Surgical History:  Procedure Laterality Date   NO PAST SURGERIES      Family History  Problem Relation Age of Onset   Stroke Mother     Social History   Tobacco Use   Smoking status: Never    Passive exposure: Never   Smokeless tobacco: Never  Vaping Use   Vaping status: Never Used  Substance Use Topics   Alcohol use: Never   Drug use: Never    Allergies:  Allergies  Allergen Reactions   Iohexol Shortness Of Breath and Cough    Cough and SOB s/p contrast administration, required Epi, improved post EPI    Medications Prior to Admission  Medication Sig Dispense Refill Last Dose/Taking   acetaminophen (TYLENOL) 500 MG tablet Take 2 tablets (1,000 mg total) by mouth every 6 (six) hours. 30 tablet 0 03/06/2024 at  7:00 PM   cyclobenzaprine (FLEXERIL) 10 MG tablet Take 1 tablet (10 mg total) by mouth 3 (three) times daily. 30 tablet 0 03/06/2024 at  4:00 PM   doxylamine, Sleep, (UNISOM) 25 MG tablet Take 1 tablet (25 mg total) by mouth at  bedtime as needed for sleep. 30 tablet 0 03/05/2024   enoxaparin (LOVENOX) 40 MG/0.4ML injection Inject 0.4 mLs (40 mg total) into the skin daily. 12 mL 3 03/05/2024 at  9:30 PM   omeprazole (PRILOSEC) 20 MG capsule TAKE 1 CAPSULE(20 MG) BY MOUTH DAILY 90 capsule 0 03/05/2024 at  8:00 PM   Prenatal Vit-Fe Fumarate-FA (PREPLUS) 27-1 MG TABS Take 1 tablet by mouth daily. 30 tablet 13 03/06/2024   traMADol (ULTRAM) 50 MG tablet Take 1 tablet (50 mg total) by mouth every 6 (six) hours as needed for moderate pain (pain score 4-6). 30 tablet 0 03/06/2024   oxyCODONE (OXY IR/ROXICODONE) 5 MG immediate release tablet Take 1 tablet (5 mg total) by mouth every 4 (four) hours as needed for severe pain (pain score 7-10). 30 tablet 0     Review of Systems  Gastrointestinal:  Negative for abdominal pain, constipation, diarrhea, nausea and vomiting.  Genitourinary:  Negative for difficulty urinating, dysuria, vaginal bleeding and vaginal discharge.   Physical Exam  Blood pressure 118/65, pulse 96, temperature 97.7 F (36.5 C), temperature source Oral, resp. rate (!) 24, height 5\' 3"  (1.6 m), last menstrual period 07/24/2023, SpO2 98%.  Physical Exam  Fetal Assessment 145 bpm, Mod Var, -Decels, +15x15 Accels Toco: Irregular Ctx and Irritability  MAU Course   Results for orders placed or performed during the hospital encounter of 03/06/24 (from the past 24 hours)  Comprehensive metabolic panel     Status: Abnormal   Collection Time: 03/06/24 11:17 PM  Result Value Ref Range   Sodium 136 135 - 145 mmol/L   Potassium 3.5 3.5 - 5.1 mmol/L   Chloride 108 98 - 111 mmol/L   CO2 18 (L) 22 - 32 mmol/L   Glucose, Bld 107 (H) 70 - 99 mg/dL   BUN 6 6 - 20 mg/dL   Creatinine, Ser 9.62 0.44 - 1.00 mg/dL   Calcium 8.8 (L) 8.9 - 10.3 mg/dL   Total Protein 5.9 (L) 6.5 - 8.1 g/dL   Albumin 2.8 (L) 3.5 - 5.0 g/dL   AST 19 15 - 41 U/L   ALT 15 0 - 44 U/L   Alkaline Phosphatase 110 38 - 126 U/L   Total Bilirubin 0.2  0.0 - 1.2 mg/dL   GFR, Estimated >95 >28 mL/min   Anion gap 10 5 - 15  CBC with Differential/Platelet     Status: Abnormal   Collection Time: 03/06/24 11:17 PM  Result Value Ref Range   WBC 20.8 (H) 4.0 - 10.5 K/uL   RBC 3.69 (L) 3.87 - 5.11 MIL/uL   Hemoglobin 10.4 (L) 12.0 - 15.0 g/dL   HCT 41.3 (L) 24.4 - 01.0 %   MCV 81.0 80.0 - 100.0 fL   MCH 28.2 26.0 - 34.0 pg   MCHC 34.8 30.0 - 36.0 g/dL   RDW 27.2 53.6 - 64.4 %   Platelets 185 150 - 400 K/uL   nRBC 0.0 0.0 - 0.2 %   Neutrophils Relative % 72 %   Neutro Abs 14.9 (H) 1.7 - 7.7 K/uL   Lymphocytes Relative 14 %   Lymphs Abs 3.0 0.7 - 4.0 K/uL   Monocytes Relative 8 %   Monocytes Absolute 1.7 (H) 0.1 - 1.0 K/uL   Eosinophils Relative 2 %   Eosinophils Absolute 0.4 0.0 - 0.5 K/uL   Basophils Relative 0 %   Basophils Absolute 0.1 0.0 - 0.1 K/uL   Immature Granulocytes 4 %   Abs Immature Granulocytes 0.83 (H) 0.00 - 0.07 K/uL   No results found.  MDM PE Labs: EFM  Assessment and Plan  23 year old G1P0  SIUP at 32.2 weeks Cat I FT SOB Chest Pain   -EKG attempted, but unsuccessful d/t patient refusal to lay still during evaluation. - -Exam findings discussed. Cherre Robins MSN, CNM 03/06/2024, 11:55 PM

## 2024-03-06 NOTE — MAU Note (Signed)
 Bethany Harris is a 23 y.o. at [redacted]w[redacted]d here in MAU reporting: Pt states her heart has been beating fast since this afternoon. Pt states her chest was hurting with it. Pt states she has been here before with the same pain and as treated for it. Pt states the pain in the chest feels like her heart is beating out of her chest and she the pain is sharp. Pt states she feels like she can't get a deep breath. Pt states she has felt dizzy and has had a headache all afternoon. Pt states the top of her abdomen hurts as well. Pt states the pain is constant and is tight. Pt states  Pt denies LOF or VB. +FM   Onset of complaint: 1700 Pain score: 10/10 chest pain  10/10 HA 10/10 abdominal pain  Vitals:   03/06/24 2254  BP: 115/72  Pulse: (!) 106  Resp: (!) 24  Temp: 97.7 F (36.5 C)  SpO2: 100%     FHT:146 Lab orders placed from triage:

## 2024-03-07 DIAGNOSIS — O99891 Other specified diseases and conditions complicating pregnancy: Secondary | ICD-10-CM

## 2024-03-07 DIAGNOSIS — Z3A32 32 weeks gestation of pregnancy: Secondary | ICD-10-CM

## 2024-03-07 DIAGNOSIS — R0602 Shortness of breath: Secondary | ICD-10-CM

## 2024-03-07 LAB — RESPIRATORY PANEL BY PCR

## 2024-03-07 LAB — URINALYSIS, ROUTINE W REFLEX MICROSCOPIC
Bilirubin Urine: NEGATIVE
Glucose, UA: NEGATIVE mg/dL
Hgb urine dipstick: NEGATIVE
Ketones, ur: NEGATIVE mg/dL
Leukocytes,Ua: NEGATIVE
Nitrite: NEGATIVE
Protein, ur: NEGATIVE mg/dL
Specific Gravity, Urine: 1.013 (ref 1.005–1.030)
pH: 7 (ref 5.0–8.0)

## 2024-03-11 ENCOUNTER — Telehealth: Payer: Self-pay | Admitting: Lactation Services

## 2024-03-11 ENCOUNTER — Other Ambulatory Visit: Payer: Self-pay | Admitting: Lactation Services

## 2024-03-11 MED ORDER — PROMETHAZINE HCL 25 MG PO TABS: 25.0000 mg | ORAL_TABLET | Freq: Four times a day (QID) | ORAL | 1 refills | Status: DC | PRN

## 2024-03-11 NOTE — Progress Notes (Signed)
 Patient reports nausea and vomiting with pregnancy. Phenergan sent per standing order.

## 2024-03-11 NOTE — Telephone Encounter (Signed)
 Received PA request for Promethazine. Per chart review, patient is Medicaid pending. Called and informed pharmacy. They report she does not have insurance listed and to disregard request.

## 2024-03-13 ENCOUNTER — Ambulatory Visit (INDEPENDENT_AMBULATORY_CARE_PROVIDER_SITE_OTHER): Payer: Self-pay | Admitting: General Practice

## 2024-03-13 ENCOUNTER — Other Ambulatory Visit (HOSPITAL_COMMUNITY)
Admission: RE | Admit: 2024-03-13 | Discharge: 2024-03-13 | Disposition: A | Discharge status: Home / Self Care | Source: Ambulatory Visit | Attending: Family Medicine | Admitting: Family Medicine

## 2024-03-13 ENCOUNTER — Encounter: Payer: Self-pay | Admitting: General Practice

## 2024-03-13 ENCOUNTER — Other Ambulatory Visit: Payer: Self-pay

## 2024-03-13 VITALS — BP 83/43 | HR 147

## 2024-03-13 DIAGNOSIS — N898 Other specified noninflammatory disorders of vagina: Secondary | ICD-10-CM | POA: Insufficient documentation

## 2024-03-13 DIAGNOSIS — R300 Dysuria: Secondary | ICD-10-CM

## 2024-03-13 LAB — POCT URINALYSIS DIP (DEVICE)
Bilirubin Urine: NEGATIVE
Glucose, UA: NEGATIVE mg/dL
Hgb urine dipstick: NEGATIVE
Ketones, ur: NEGATIVE mg/dL
Leukocytes,Ua: NEGATIVE
Nitrite: NEGATIVE
Protein, ur: NEGATIVE mg/dL
Specific Gravity, Urine: >=1.03 (ref 1.005–1.030)
Urobilinogen, UA: 0.2 mg/dL (ref 0.0–1.0)
pH: 6 (ref 5.0–8.0)
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
## 2024-03-14 LAB — CERVICOVAGINAL ANCILLARY ONLY
Bacterial Vaginitis (gardnerella): POSITIVE — AB
Candida Glabrata: NEGATIVE
Candida Vaginitis: NEGATIVE
Chlamydia: NEGATIVE
Comment: NEGATIVE
Comment: NEGATIVE
Comment: NEGATIVE
Comment: NEGATIVE
Comment: NEGATIVE
Comment: NORMAL
Neisseria Gonorrhea: NEGATIVE
Trichomonas: NEGATIVE

## 2024-03-16 ENCOUNTER — Other Ambulatory Visit: Payer: Self-pay

## 2024-03-16 ENCOUNTER — Emergency Department (HOSPITAL_COMMUNITY)

## 2024-03-16 ENCOUNTER — Observation Stay (HOSPITAL_COMMUNITY)
Admission: AD | Admit: 2024-03-16 | Discharge: 2024-03-17 | Disposition: A | Discharge status: Home / Self Care | Attending: Obstetrics and Gynecology | Admitting: Obstetrics and Gynecology

## 2024-03-16 ENCOUNTER — Inpatient Hospital Stay (HOSPITAL_COMMUNITY)
Admission: AD | Admit: 2024-03-16 | Discharge: 2024-03-16 | Disposition: A | Discharge status: Still In Hospital | Payer: Self-pay | Source: Home / Self Care | Attending: Obstetrics and Gynecology | Admitting: Obstetrics and Gynecology

## 2024-03-16 DIAGNOSIS — O219 Vomiting of pregnancy, unspecified: Secondary | ICD-10-CM | POA: Insufficient documentation

## 2024-03-16 DIAGNOSIS — M79602 Pain in left arm: Secondary | ICD-10-CM | POA: Insufficient documentation

## 2024-03-16 DIAGNOSIS — R0602 Shortness of breath: Secondary | ICD-10-CM | POA: Insufficient documentation

## 2024-03-16 DIAGNOSIS — Z79899 Other long term (current) drug therapy: Secondary | ICD-10-CM | POA: Diagnosis not present

## 2024-03-16 DIAGNOSIS — R000 Tachycardia, unspecified: Secondary | ICD-10-CM | POA: Insufficient documentation

## 2024-03-16 DIAGNOSIS — R0902 Hypoxemia: Secondary | ICD-10-CM | POA: Diagnosis not present

## 2024-03-16 DIAGNOSIS — Z1152 Encounter for screening for COVID-19: Secondary | ICD-10-CM | POA: Diagnosis not present

## 2024-03-16 DIAGNOSIS — R519 Headache, unspecified: Secondary | ICD-10-CM | POA: Insufficient documentation

## 2024-03-16 DIAGNOSIS — O26893 Other specified pregnancy related conditions, third trimester: Principal | ICD-10-CM | POA: Insufficient documentation

## 2024-03-16 DIAGNOSIS — O0990 Supervision of high risk pregnancy, unspecified, unspecified trimester: Secondary | ICD-10-CM

## 2024-03-16 DIAGNOSIS — R079 Chest pain, unspecified: Principal | ICD-10-CM

## 2024-03-16 DIAGNOSIS — Z3A33 33 weeks gestation of pregnancy: Secondary | ICD-10-CM | POA: Insufficient documentation

## 2024-03-16 DIAGNOSIS — O99891 Other specified diseases and conditions complicating pregnancy: Secondary | ICD-10-CM | POA: Diagnosis present

## 2024-03-16 DIAGNOSIS — O212 Late vomiting of pregnancy: Secondary | ICD-10-CM | POA: Insufficient documentation

## 2024-03-16 DIAGNOSIS — R0789 Other chest pain: Secondary | ICD-10-CM | POA: Diagnosis not present

## 2024-03-16 LAB — CBC WITH DIFFERENTIAL/PLATELET
Abs Immature Granulocytes: 1.06 10*3/uL — ABNORMAL HIGH (ref 0.00–0.07)
Basophils Absolute: 0.2 10*3/uL — ABNORMAL HIGH (ref 0.0–0.1)
Basophils Relative: 1 %
Eosinophils Absolute: 0.3 10*3/uL (ref 0.0–0.5)
Eosinophils Relative: 1 %
HCT: 32.7 % — ABNORMAL LOW (ref 36.0–46.0)
Hemoglobin: 10.9 g/dL — ABNORMAL LOW (ref 12.0–15.0)
Immature Granulocytes: 5 %
Lymphocytes Relative: 14 %
Lymphs Abs: 2.9 10*3/uL (ref 0.7–4.0)
MCH: 27.6 pg (ref 26.0–34.0)
MCHC: 33.3 g/dL (ref 30.0–36.0)
MCV: 82.8 fL (ref 80.0–100.0)
Monocytes Absolute: 1.8 10*3/uL — ABNORMAL HIGH (ref 0.1–1.0)
Monocytes Relative: 9 %
Neutro Abs: 14.4 10*3/uL — ABNORMAL HIGH (ref 1.7–7.7)
Neutrophils Relative %: 70 %
Platelets: 225 10*3/uL (ref 150–400)
RBC: 3.95 MIL/uL (ref 3.87–5.11)
RDW: 13.2 % (ref 11.5–15.5)
WBC: 20.5 10*3/uL — ABNORMAL HIGH (ref 4.0–10.5)
nRBC: 0 % (ref 0.0–0.2)

## 2024-03-16 LAB — COMPREHENSIVE METABOLIC PANEL WITH GFR
ALT: 17 U/L (ref 0–44)
AST: 23 U/L (ref 15–41)
Albumin: 2.9 g/dL — ABNORMAL LOW (ref 3.5–5.0)
Alkaline Phosphatase: 137 U/L — ABNORMAL HIGH (ref 38–126)
Anion gap: 8 (ref 5–15)
BUN: 8 mg/dL (ref 6–20)
CO2: 20 mmol/L — ABNORMAL LOW (ref 22–32)
Calcium: 8.6 mg/dL — ABNORMAL LOW (ref 8.9–10.3)
Chloride: 108 mmol/L (ref 98–111)
Creatinine, Ser: 0.52 mg/dL (ref 0.44–1.00)
GFR, Estimated: >60 mL/min (ref >60)
Glucose, Bld: 86 mg/dL (ref 70–99)
Potassium: 3.8 mmol/L (ref 3.5–5.1)
Sodium: 136 mmol/L (ref 135–145)
Total Bilirubin: 0.5 mg/dL (ref 0.0–1.2)
Total Protein: 6.1 g/dL — ABNORMAL LOW (ref 6.5–8.1)

## 2024-03-16 LAB — GLUCOSE, CAPILLARY: Glucose-Capillary: 89 mg/dL (ref 70–99)

## 2024-03-16 LAB — WET PREP, GENITAL
Clue Cells Wet Prep HPF POC: NONE SEEN
Sperm: NONE SEEN
Trich, Wet Prep: NONE SEEN
WBC, Wet Prep HPF POC: >=10 — AB (ref <10)
Yeast Wet Prep HPF POC: NONE SEEN

## 2024-03-16 LAB — TROPONIN I (HIGH SENSITIVITY): Troponin I (High Sensitivity): 7 ng/L (ref <18)

## 2024-03-16 LAB — D-DIMER, QUANTITATIVE: D-Dimer, Quant: 2.37 ug{FEU}/mL — ABNORMAL HIGH (ref 0.00–0.50)

## 2024-03-16 MED ORDER — ONDANSETRON HCL 4 MG/2ML IJ SOLN
4.0000 mg | Freq: Once | INTRAMUSCULAR | Status: AC
  Administered 2024-03-16: 4 mg via INTRAVENOUS
  Filled 2024-03-16: qty 2

## 2024-03-16 MED ORDER — MORPHINE SULFATE (PF) 4 MG/ML IV SOLN
4.0000 mg | Freq: Once | INTRAVENOUS | Status: AC
  Administered 2024-03-16: 4 mg via INTRAVENOUS
  Filled 2024-03-16: qty 1

## 2024-03-16 MED ORDER — SODIUM CHLORIDE 0.9 % IV BOLUS
1000.0000 mL | Freq: Once | INTRAVENOUS | Status: AC
  Administered 2024-03-16: 1000 mL via INTRAVENOUS

## 2024-03-16 NOTE — ED Triage Notes (Signed)
 The pt has had chest pain for one month intermittently but today she has had chest pain with some sob  preg she is due June 3rd

## 2024-03-16 NOTE — ED Provider Notes (Signed)
 [place] EMERGENCY DEPARTMENT AT [facility] Provider Note   CSN: [id number] Arrival date & time: 03/16/24  2300     History {Add pertinent medical, surgical, social history, OB history to HPI:1} Chief Complaint  Patient presents with   Chest Pain    Bethany Harris is a 23 y.o. female.   Chest Pain    22 y.o. G1P0000 at [redacted]w[redacted]d presents to MAU with complaints of SOB, Chest pain, left sided arm pain and weakness in the left hand that started today.   Home Medications Prior to Admission medications   Medication Sig Start Date End Date Taking? Authorizing Provider  acetaminophen  (TYLENOL ) 500 MG tablet Take 2 tablets (1,000 mg total) by mouth every 6 (six) hours. 02/16/24   Lacey Pian, MD  cyclobenzaprine  (FLEXERIL ) 10 MG tablet Take 1 tablet (10 mg total) by mouth 3 (three) times daily. 02/16/24   Lacey Pian, MD  doxylamine , Sleep, (UNISOM ) 25 MG tablet Take 1 tablet (25 mg total) by mouth at bedtime as needed for sleep. 02/05/24   Tresia Fruit, MD  enoxaparin  (LOVENOX ) 40 MG/0.4ML injection Inject 0.4 mLs (40 mg total) into the skin daily. 02/17/24   Lacey Pian, MD  omeprazole  (PRILOSEC) 20 MG capsule TAKE 1 CAPSULE(20 MG) BY MOUTH DAILY 07/13/23   Nichols, Tonya S, NP  oxyCODONE  (OXY IR/ROXICODONE ) 5 MG immediate release tablet Take 1 tablet (5 mg total) by mouth every 4 (four) hours as needed for severe pain (pain score 7-10). 03/05/24   Abigail Abler, MD  Prenatal Vit-Fe Fumarate-FA (PREPLUS) 27-1 MG TABS Take 1 tablet by mouth daily. 03/05/24   Abigail Abler, MD  promethazine  (PHENERGAN ) 25 MG tablet Take 1 tablet (25 mg total) by mouth every 6 (six) hours as needed for nausea or vomiting. 03/11/24   Maud Sorenson, MD  traMADol  (ULTRAM ) 50 MG tablet Take 1 tablet (50 mg total) by mouth every 6 (six) hours as needed for moderate pain (pain score 4-6). 02/16/24   Lacey Pian, MD      Allergies    Iohexol     Review of Systems   Review of Systems   Cardiovascular:  Positive for chest pain.    Physical Exam Updated Vital Signs BP 113/79 (BP Location: Right Arm)   Pulse (!) 111   Temp 97.6 F (36.4 C) (Oral)   Resp 16   Ht 5\' 3"  (1.6 m)   Wt 61.8 kg   LMP 07/24/2023 (Exact Date)   SpO2 100%   BMI 24.13 kg/m  Physical Exam  ED Results / Procedures / Treatments   Labs (all labs ordered are listed, but only abnormal results are displayed) Labs Reviewed - No data to display  EKG None  Radiology No results found.  Procedures Procedures  {Document cardiac monitor, telemetry assessment procedure when appropriate:1}  Medications Ordered in ED Medications - No data to display  ED Course/ Medical Decision Making/ A&P   {   Click here for ABCD2, HEART and other calculatorsREFRESH Note before signing :1}                              Medical Decision Making Amount and/or Complexity of Data Reviewed Labs: ordered. Radiology: ordered.   ***  {Document critical care time when appropriate:1} {Document review of labs and clinical decision tools ie heart score, Chads2Vasc2 etc:1}  {Document your independent review of radiology images, and any outside records:1} {Document your discussion  with family members, caretakers, and with consultants:1} {Document social determinants of health affecting pt's care:1} {Document your decision making why or why not admission, treatments were needed:1} Final Clinical Impression(s) / ED Diagnoses Final diagnoses:  None    Rx / DC Orders ED Discharge Orders     None
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
## 2024-03-16 NOTE — ED Notes (Signed)
 [facility]  she is pregnant and is due June first  she has a headache  and she has been vomiting with chest pain since yesterday  she came over with a bag of fluid infusing and she reports that she had one other liter there and she also reports that she received an injection there  for the headachei did not receive report from there about this pt

## 2024-03-16 NOTE — MAU Note (Signed)
 Requested per provider to call ED charge to assure we could do a stretcher to stretcher transfer when pt is moved to main ER. Spoke with Chad,RN . Will have to transfer pt to wheelchair due to wait in ED. No stretchers available in triage and there will be a wait.
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
## 2024-03-17 ENCOUNTER — Observation Stay (HOSPITAL_COMMUNITY)

## 2024-03-17 ENCOUNTER — Encounter (HOSPITAL_COMMUNITY): Payer: Self-pay | Admitting: Obstetrics and Gynecology

## 2024-03-17 DIAGNOSIS — Z3A33 33 weeks gestation of pregnancy: Secondary | ICD-10-CM | POA: Diagnosis not present

## 2024-03-17 DIAGNOSIS — O99891 Other specified diseases and conditions complicating pregnancy: Secondary | ICD-10-CM

## 2024-03-17 DIAGNOSIS — R079 Chest pain, unspecified: Secondary | ICD-10-CM

## 2024-03-17 LAB — HEPARIN LEVEL (UNFRACTIONATED): Heparin Unfractionated: 0.15 [IU]/mL — ABNORMAL LOW (ref 0.30–0.70)

## 2024-03-17 LAB — RESP PANEL BY RT-PCR (RSV, FLU A&B, COVID)  RVPGX2
Influenza A by PCR: NEGATIVE
Influenza B by PCR: NEGATIVE
Resp Syncytial Virus by PCR: NEGATIVE
SARS Coronavirus 2 by RT PCR: NEGATIVE

## 2024-03-17 LAB — TYPE AND SCREEN
ABO/RH(D): O POS
Antibody Screen: NEGATIVE

## 2024-03-17 LAB — TSH: TSH: 1.79 u[IU]/mL (ref 0.350–4.500)

## 2024-03-17 LAB — TROPONIN I (HIGH SENSITIVITY): Troponin I (High Sensitivity): 5 ng/L (ref <18)

## 2024-03-17 LAB — T4, FREE: Free T4: 0.82 ng/dL (ref 0.61–1.12)

## 2024-03-17 MED ORDER — LACTATED RINGERS IV SOLN: 125.0000 mL/h | INTRAVENOUS | Status: DC

## 2024-03-17 MED ORDER — IOHEXOL 350 MG/ML SOLN
75.0000 mL | Freq: Once | INTRAVENOUS | Status: AC | PRN
  Administered 2024-03-17: 75 mL via INTRAVENOUS

## 2024-03-17 MED ORDER — DOXYLAMINE SUCCINATE (SLEEP) 25 MG PO TABS: 25.0000 mg | ORAL_TABLET | Freq: Every evening | ORAL | Status: DC | PRN

## 2024-03-17 MED ORDER — HEPARIN BOLUS VIA INFUSION
1900.0000 [IU] | Freq: Once | INTRAVENOUS | Status: AC
  Administered 2024-03-17: 1900 [IU] via INTRAVENOUS
  Filled 2024-03-17: qty 1900

## 2024-03-17 MED ORDER — OXYCODONE HCL 5 MG PO TABS
5.0000 mg | ORAL_TABLET | ORAL | Status: DC | PRN
  Administered 2024-03-17: 5 mg via ORAL
  Filled 2024-03-17: qty 1

## 2024-03-17 MED ORDER — DIPHENHYDRAMINE HCL 25 MG PO CAPS
50.0000 mg | ORAL_CAPSULE | Freq: Once | ORAL | Status: AC
  Administered 2024-03-17: 50 mg via ORAL
  Filled 2024-03-17: qty 2

## 2024-03-17 MED ORDER — ALUM & MAG HYDROXIDE-SIMETH 200-200-20 MG/5ML PO SUSP
15.0000 mL | ORAL | Status: DC | PRN
  Administered 2024-03-17: 15 mL via ORAL
  Filled 2024-03-17: qty 30

## 2024-03-17 MED ORDER — MORPHINE SULFATE (PF) 4 MG/ML IV SOLN
4.0000 mg | Freq: Once | INTRAVENOUS | Status: AC
  Administered 2024-03-17: 4 mg via INTRAVENOUS
  Filled 2024-03-17: qty 1

## 2024-03-17 MED ORDER — PANTOPRAZOLE SODIUM 40 MG PO TBEC
40.0000 mg | DELAYED_RELEASE_TABLET | Freq: Every day | ORAL | Status: DC
  Administered 2024-03-17: 40 mg via ORAL
  Filled 2024-03-17: qty 1

## 2024-03-17 MED ORDER — HEPARIN BOLUS VIA INFUSION
4000.0000 [IU] | Freq: Once | INTRAVENOUS | Status: AC
  Administered 2024-03-17: 4000 [IU] via INTRAVENOUS
  Filled 2024-03-17: qty 4000

## 2024-03-17 MED ORDER — PROMETHAZINE HCL 25 MG PO TABS: 25.0000 mg | ORAL_TABLET | Freq: Four times a day (QID) | ORAL | Status: DC | PRN

## 2024-03-17 MED ORDER — CALCIUM CARBONATE ANTACID 500 MG PO CHEW: 2.0000 | CHEWABLE_TABLET | ORAL | Status: DC | PRN

## 2024-03-17 MED ORDER — HEPARIN (PORCINE) 25000 UT/250ML-% IV SOLN
1200.0000 [IU]/h | INTRAVENOUS | Status: DC
  Administered 2024-03-17: 1000 [IU]/h via INTRAVENOUS
  Filled 2024-03-17: qty 250

## 2024-03-17 MED ORDER — DIPHENHYDRAMINE HCL 50 MG/ML IJ SOLN: 50.0000 mg | Freq: Once | INTRAMUSCULAR | Status: AC

## 2024-03-17 MED ORDER — PRENATAL MULTIVITAMIN CH
1.0000 | ORAL_TABLET | Freq: Every day | ORAL | Status: DC
  Administered 2024-03-17: 1 via ORAL
  Filled 2024-03-17 (×2): qty 1

## 2024-03-17 MED ORDER — METHYLPREDNISOLONE SODIUM SUCC 40 MG IJ SOLR
40.0000 mg | Freq: Once | INTRAMUSCULAR | Status: AC
  Administered 2024-03-17: 40 mg via INTRAVENOUS
  Filled 2024-03-17 (×2): qty 1

## 2024-03-17 MED ORDER — ACETAMINOPHEN 500 MG PO TABS
1000.0000 mg | ORAL_TABLET | Freq: Four times a day (QID) | ORAL | Status: DC
  Administered 2024-03-17 (×3): 1000 mg via ORAL
  Filled 2024-03-17 (×3): qty 2

## 2024-03-17 MED ORDER — TRAMADOL HCL 50 MG PO TABS: 50.0000 mg | ORAL_TABLET | Freq: Four times a day (QID) | ORAL | Status: DC | PRN

## 2024-03-17 MED ORDER — METHYLPREDNISOLONE SODIUM SUCC 125 MG IJ SOLR
40.0000 mg | Freq: Once | INTRAMUSCULAR | Status: AC
  Administered 2024-03-17: 40 mg via INTRAVENOUS
  Filled 2024-03-17: qty 2

## 2024-03-17 MED ORDER — EPINEPHRINE 0.15 MG/0.3ML IJ SOAJ
INTRAMUSCULAR | Status: AC
  Filled 2024-03-17: qty 0.3

## 2024-03-17 MED ORDER — DOCUSATE SODIUM 100 MG PO CAPS
100.0000 mg | ORAL_CAPSULE | Freq: Every day | ORAL | Status: DC
  Administered 2024-03-17: 100 mg via ORAL
  Filled 2024-03-17: qty 1

## 2024-03-17 MED ORDER — ENOXAPARIN SODIUM 40 MG/0.4ML IJ SOSY
40.0000 mg | PREFILLED_SYRINGE | INTRAMUSCULAR | Status: DC
  Administered 2024-03-17: 40 mg via SUBCUTANEOUS
  Filled 2024-03-17: qty 0.4

## 2024-03-17 MED ORDER — CYCLOBENZAPRINE HCL 10 MG PO TABS
10.0000 mg | ORAL_TABLET | Freq: Three times a day (TID) | ORAL | Status: DC | PRN
  Administered 2024-03-17: 10 mg via ORAL
  Filled 2024-03-17: qty 1
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
## 2024-03-17 NOTE — ED Notes (Signed)
 This RN called CCMD to have patient monitored.

## 2024-03-17 NOTE — Progress Notes (Signed)
 In to consent for CT chest during pregnancy.  Dari interpreter [phone] used for consent discussion.  I reviewed the CT imaging during pregnancy consent form with the patient and read word for word with the use of the Dari interpreter the risks of radiation and contrast during pregnancy, specifically the very small increased risk of childhood cancer and lifetime risk of cancer, no known increased risk of miscarriage/pregnancy loss. We reviewed the benefits in being able to diagnose and appropriately care for her as if she truly has a pulmonary embolism that would be dangerous to her health/life as well as the health/life of her baby. She verbalized her understanding of all of the above. She had the opportunity to ask questions and signed to form in my presence.  Marci Setter, MD, FACOG Obstetrician & Gynecologist, [facility] for [facility], [facility]

## 2024-03-17 NOTE — ED Notes (Signed)
 Patient stated she was starting to have abdominal pain that went around to her back. When asked to describe it she stated "bad" and "it comes and goes". EDP Rosealee Concha, MD notified

## 2024-03-17 NOTE — ED Notes (Signed)
 ED TO INPATIENT HANDOFF REPORT  ED Nurse Name and Phone #: Angelina Kempf RN [phone]  S Name/Age/Gender Bethany Harris 23 y.o. female Room/Bed: 020C/020C  Code Status   Code Status: Full Code  Home/SNF/Other Home Patient oriented to: self, place, time, and situation Is this baseline? Yes   Triage Complete: Triage complete  Chief Complaint Chest pain [R07.9]  Triage Note The pt has had chest pain for one month intermittently but today she has had chest pain with some sob  preg she is due June 3rd   Allergies Allergies  Allergen Reactions   Iohexol  Shortness Of Breath and Cough    Cough and SOB s/p contrast administration, required Epi, improved post EPI Contrast die    Level of Care/Admitting Diagnosis ED Disposition     ED Disposition  Admit   Condition  --   Comment  Hospital Area: [facility] [[id number]]  Level of Care: Antepartum [20]  May place patient in observation at [facility] or [facility] if equivalent level of care is available:: No  Covid Evaluation: Confirmed COVID Negative  Diagnosis: Chest pain [829562]  Admitting Physician: Izell Marsh [[id number]]  Attending Physician: Izell Marsh [[id number]]          B Medical/Surgery History Past Medical History:  Diagnosis Date   GERD (gastroesophageal reflux disease)    Past Surgical History:  Procedure Laterality Date   NO PAST SURGERIES       A IV Location/Drains/Wounds Patient Lines/Drains/Airways Status     Active Line/Drains/Airways     Name Placement date Placement time Site Days   Peripheral IV 03/17/24 20 G 1" Anterior;Left Forearm 03/17/24  0050  Forearm  less than 1            Intake/Output Last 24 hours No intake or output data in the 24 hours ending 03/17/24 0445  Labs/Imaging Results for orders placed or performed during the hospital encounter of 03/16/24 (from the past 48 hours)  Resp panel by RT-PCR (RSV, Flu A&B, Covid) Anterior Nasal Swab      Status: None   Collection Time: 03/17/24 12:00 AM   Specimen: Anterior Nasal Swab  Result Value Ref Range   SARS Coronavirus 2 by RT PCR NEGATIVE NEGATIVE   Influenza A by PCR NEGATIVE NEGATIVE   Influenza B by PCR NEGATIVE NEGATIVE    Comment: (NOTE) The Xpert Xpress SARS-CoV-2/FLU/RSV plus assay is intended as an aid in the diagnosis of influenza from Nasopharyngeal swab specimens and should not be used as a sole basis for treatment. Nasal washings and aspirates are unacceptable for Xpert Xpress SARS-CoV-2/FLU/RSV testing.  Fact Sheet for Patients: BloggerCourse.com  Fact Sheet for Healthcare Providers: SeriousBroker.it  This test is not yet approved or cleared by the [place]  FDA and has been authorized for detection and/or diagnosis of SARS-CoV-2 by FDA under an Emergency Use Authorization (EUA). This EUA will remain in effect (meaning this test can be used) for the duration of the COVID-19 declaration under Section 564(b)(1) of the Act, 21 U.S.C. section 360bbb-3(b)(1), unless the authorization is terminated or revoked.     Resp Syncytial Virus by PCR NEGATIVE NEGATIVE    Comment: (NOTE) Fact Sheet for Patients: BloggerCourse.com  Fact Sheet for Healthcare Providers: SeriousBroker.it  This test is not yet approved or cleared by the [place]  FDA and has been authorized for detection and/or diagnosis of SARS-CoV-2 by FDA under an Emergency Use Authorization (EUA). This EUA will remain in effect (meaning this test can  be used) for the duration of the COVID-19 declaration under Section 564(b)(1) of the Act, 21 U.S.C. section 360bbb-3(b)(1), unless the authorization is terminated or revoked.  Performed at [facility], [address] [place], [place] [address]   TSH     Status: None   Collection Time: 03/17/24 12:31 AM  Result Value Ref Range   TSH  1.790 0.350 - 4.500 uIU/mL    Comment: Performed by a 3rd Generation assay with a functional sensitivity of <=0.01 uIU/mL. Performed at [facility], [address] [place], [place] [address]   Troponin I (High Sensitivity)     Status: None   Collection Time: 03/17/24 12:31 AM  Result Value Ref Range   Troponin I (High Sensitivity) 5 <18 ng/L    Comment: (NOTE) Elevated high sensitivity troponin I (hsTnI) values and significant  changes across serial measurements may suggest ACS but many other  chronic and acute conditions are known to elevate hsTnI results.  Refer to the "Links" section for chest pain algorithms and additional  guidance. Performed at [facility], [address], [place], [place] [address]    DG Chest Port 1 View Result Date: 03/17/2024 CLINICAL DATA:  Chest pain. EXAM: PORTABLE CHEST 1 VIEW COMPARISON:  March 06, 2024 FINDINGS: The heart size and mediastinal contours are within normal limits. Both lungs are clear. The visualized skeletal structures are unremarkable. IMPRESSION: No active disease. Electronically Signed   By: Virgle Grime M.D.   On: 03/17/2024 00:03    Pending Labs Unresulted Labs (From admission, onward)     Start     Ordered   03/17/24 0800  Heparin  level (unfractionated)  Once-Timed,   URGENT        03/17/24 0130   03/17/24 0007  T4, free  Once,   URGENT        03/17/24 0006            Vitals/Pain Today's Vitals   03/17/24 0300 03/17/24 0304 03/17/24 0329 03/17/24 0437  BP: 104/63     Pulse: (!) 117     Resp: 17     Temp:   97.6 F (36.4 C)   TempSrc:   Oral   SpO2: 100%     Weight:      Height:      PainSc:  2   8     Isolation Precautions No active isolations  Medications Medications  heparin  ADULT infusion 100 units/mL (25000 units/250mL) (1,000 Units/hr Intravenous New Bag/Given 03/17/24 0144)  diphenhydrAMINE  (BENADRYL ) capsule 50 mg (has no administration in time range)    Or  diphenhydrAMINE   (BENADRYL ) injection 50 mg (has no administration in time range)  lactated ringers  infusion (has no administration in time range)  acetaminophen  (TYLENOL ) tablet 1,000 mg (1,000 mg Oral Given 03/17/24 0437)  docusate sodium  (COLACE) capsule 100 mg (has no administration in time range)  prenatal multivitamin tablet 1 tablet (has no administration in time range)  oxyCODONE  (Oxy IR/ROXICODONE ) immediate release tablet 5 mg (has no administration in time range)  traMADol  (ULTRAM ) tablet 50 mg (has no administration in time range)  doxylamine  (Sleep) (UNISOM ) tablet 25 mg (has no administration in time range)  pantoprazole  (PROTONIX ) EC tablet 40 mg (has no administration in time range)  cyclobenzaprine  (FLEXERIL ) tablet 10 mg (has no administration in time range)  promethazine  (PHENERGAN ) tablet 25 mg (has no administration in time range)  alum & mag hydroxide-simeth (MAALOX/MYLANTA) 200-200-20 MG/5ML suspension 15 mL (has no  administration in time range)  morphine  (PF) 4 MG/ML injection 4 mg (4 mg Intravenous Given 03/17/24 0102)  heparin  bolus via infusion 4,000 Units (4,000 Units Intravenous Bolus from Bag 03/17/24 0144)  diphenhydrAMINE  (BENADRYL ) capsule 50 mg (50 mg Oral Given 03/17/24 0327)  methylPREDNISolone  sodium succinate (SOLU-MEDROL ) 40 mg/mL injection 40 mg (40 mg Intravenous Given 03/17/24 0434)    Mobility walks with person assist     Focused Assessments Cardiac Assessment Handoff:  Cardiac Rhythm: Sinus tachycardia No results found for: "CKTOTAL", "CKMB", "CKMBINDEX", "TROPONINI" Lab Results  Component Value Date   DDIMER 2.37 (H) 03/16/2024   Does the Patient currently have chest pain? Yes    R Recommendations: See Admitting Provider Note  Report given to:   Additional Notes:

## 2024-03-17 NOTE — Progress Notes (Signed)
 CT report reviewed. Negative for PE. Heparin  discontinued Findings reviewed with the patient and her partner Continue home lovenox  No signs of DVT on exam Abdomen soft, nontender She reports chronic pelvic pain and leg pain related to her pelvic fracture Continue home pain medications and follow up in clinic as scheduled. Discharge home today.

## 2024-03-17 NOTE — ED Notes (Signed)
Report given to 1S.

## 2024-03-17 NOTE — Progress Notes (Addendum)
 PHARMACY - ANTICOAGULATION CONSULT NOTE  Pharmacy Consult for IV heparin  Indication: pulmonary embolus r/o (enoxaparin  PTA)  Allergies  Allergen Reactions   Iohexol  Shortness Of Breath and Cough    Cough and SOB s/p contrast administration, required Epi, improved post EPI Contrast die    Patient Measurements: Height: 5\' 3"  (160 cm) Weight: 61.8 kg (136 lb 3.9 oz) IBW/kg (Calculated) : 52.4 HEPARIN  DW (KG): 61.8  Vital Signs: Temp: 97.7 F (36.5 C) (04/21 0809) Temp Source: Oral (04/21 0809) BP: 106/55 (04/21 0809) Pulse Rate: 106 (04/21 0809)  Labs: Recent Labs    03/16/24 2138 03/17/24 0031 03/17/24 0827  HGB 10.9*  --   --   HCT 32.7*  --   --   PLT 225  --   --   HEPARINUNFRC  --   --  0.15*  CREATININE 0.52  --   --   TROPONINIHS 7 5  --     Estimated Creatinine Clearance: 91.2 mL/min (by C-G formula based on SCr of 0.52 mg/dL).   Medical History: Past Medical History:  Diagnosis Date   GERD (gastroesophageal reflux disease)    Assessment: Bethany Harris is a 23 y.o. year old female admitted on 03/16/2024 with concern for PE. Of note, currently [redacted]wks pregnant. On enoxaparin  for DVT ppx with high risk pregnancy (started on 3/22, last dose 03/15/24@2130 ).  Pharmacy consulted to dose heparin . Plans for VQ scan with contrast allergy in AM.  Heparin  level 0.15, subtherapeutic with heparin  infusion at 1000 units/h. Hgb 10.9, plt 225 stable. Currently in CT for r/o PE. No signs of bleeding or issues with the infusion.   Goal of Therapy:  Heparin  level 0.3-0.7 units/ml Monitor platelets by anticoagulation protocol: Yes   Plan:  Give 1900 unit heparin  bolus x1 Increase heparin  infusion to 1200 units/hr  6 heparin  level  Daily heparin  level, CBC, and monitoring for bleeding F/u CT results and plans for anticoagulation   Thank you for allowing pharmacy to participate in this patient's care.  Jerrel Mor, PharmD PGY1 Pharmacy Resident 03/17/2024 10:03 AM
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
## 2024-03-17 NOTE — Plan of Care (Signed)
  Problem: Education: Goal: Knowledge of disease or condition will improve Outcome: Adequate for Discharge Goal: Knowledge of the prescribed therapeutic regimen will improve Outcome: Adequate for Discharge Goal: Individualized Educational Video(s) Outcome: Adequate for Discharge   Problem: Clinical Measurements: Goal: Complications related to the disease process, condition or treatment will be avoided or minimized Outcome: Adequate for Discharge   Problem: Education: Goal: Knowledge of General Education information will improve Description: Including pain rating scale, medication(s)/side effects and non-pharmacologic comfort measures Outcome: Adequate for Discharge   Problem: Health Behavior/Discharge Planning: Goal: Ability to manage health-related needs will improve Outcome: Adequate for Discharge   Problem: Clinical Measurements: Goal: Ability to maintain clinical measurements within normal limits will improve Outcome: Adequate for Discharge Goal: Will remain free from infection Outcome: Adequate for Discharge Goal: Diagnostic test results will improve Outcome: Adequate for Discharge Goal: Respiratory complications will improve Outcome: Adequate for Discharge Goal: Cardiovascular complication will be avoided Outcome: Adequate for Discharge   Problem: Activity: Goal: Risk for activity intolerance will decrease Outcome: Adequate for Discharge   Problem: Nutrition: Goal: Adequate nutrition will be maintained Outcome: Adequate for Discharge   Problem: Coping: Goal: Level of anxiety will decrease Outcome: Adequate for Discharge   Problem: Elimination: Goal: Will not experience complications related to bowel motility Outcome: Adequate for Discharge Goal: Will not experience complications related to urinary retention Outcome: Adequate for Discharge   Problem: Pain Managment: Goal: General experience of comfort will improve and/or be controlled Outcome: Adequate for  Discharge   Problem: Safety: Goal: Ability to remain free from injury will improve Outcome: Adequate for Discharge   Problem: Skin Integrity: Goal: Risk for impaired skin integrity will decrease Outcome: Adequate for Discharge

## 2024-03-17 NOTE — MAU Note (Signed)
 Rapid Response RN came per request to assess pts EKG and stability for transfer to Main ED. Report called to ED charge RN via Paula Born RN

## 2024-03-17 NOTE — Progress Notes (Addendum)
 PHARMACY - ANTICOAGULATION CONSULT NOTE  Pharmacy Consult for IV heparin  Indication: pulmonary embolus r/o (enoxaparin    Allergies  Allergen Reactions   Iohexol  Shortness Of Breath and Cough    Cough and SOB s/p contrast administration, required Epi, improved post EPI Contrast die    Patient Measurements: Height: 5\' 3"  (160 cm) Weight: 61.8 kg (136 lb 3.9 oz) IBW/kg (Calculated) : 52.4 HEPARIN  DW (KG): 61.8  Vital Signs: Temp: 97.6 F (36.4 C) (04/20 2302) Temp Source: Oral (04/20 2302) BP: 113/79 (04/20 2302) Pulse Rate: 111 (04/20 2302)  Labs: Recent Labs    03/16/24 2138  HGB 10.9*  HCT 32.7*  PLT 225  CREATININE 0.52  TROPONINIHS 7    Estimated Creatinine Clearance: 91.2 mL/min (by C-G formula based on SCr of 0.52 mg/dL).   Medical History: Past Medical History:  Diagnosis Date   GERD (gastroesophageal reflux disease)    Assessment: Bethany Harris is a 23 y.o. year old female admitted on 03/16/2024 with concern for PE. Of note, currently [redacted]wks pregnant. On enoxaparin  for DVT ppx with high risk pregnancy (started on 3/22, last dose 03/15/24@2130 ).  Pharmacy consulted to dose heparin . Plans for VQ scan with contrast allergy in AM.  Goal of Therapy:  Heparin  level 0.3-0.7 units/ml Monitor platelets by anticoagulation protocol: Yes   Plan:  Heparin  4000 units followed by heparin  infusion at 1000 units/hr  6 heparin  level  Daily heparin  level, CBC, and monitoring for bleeding F/u VQ scan results and plans for anticoagulation   Thank you for allowing pharmacy to participate in this patient's care.  Fonda Hymen, PharmD Emergency Medicine Clinical Pharmacist 03/17/2024,12:31 AM

## 2024-03-17 NOTE — ED Notes (Signed)
 Spoke with attending Izell Marsh, MD regarding timing for medication and CT. Stated to give medications at the times in the admin instructions in [facility] then have her sent to CT. Collaborated with pharmacist [place] for timing on medications. This nurse called CT to update CT technicians.

## 2024-03-18 ENCOUNTER — Other Ambulatory Visit: Payer: Self-pay

## 2024-03-19 ENCOUNTER — Other Ambulatory Visit: Payer: Self-pay

## 2024-03-19 ENCOUNTER — Ambulatory Visit: Payer: Self-pay | Admitting: Maternal & Fetal Medicine

## 2024-03-19 ENCOUNTER — Ambulatory Visit: Payer: Self-pay | Attending: Family Medicine

## 2024-03-19 VITALS — BP 87/44 | HR 163

## 2024-03-19 DIAGNOSIS — O9A213 Injury, poisoning and certain other consequences of external causes complicating pregnancy, third trimester: Secondary | ICD-10-CM

## 2024-03-19 DIAGNOSIS — T1490XA Injury, unspecified, initial encounter: Secondary | ICD-10-CM | POA: Diagnosis not present

## 2024-03-19 DIAGNOSIS — Z3A34 34 weeks gestation of pregnancy: Secondary | ICD-10-CM | POA: Diagnosis not present

## 2024-03-19 DIAGNOSIS — S329XXD Fracture of unspecified parts of lumbosacral spine and pelvis, subsequent encounter for fracture with routine healing: Secondary | ICD-10-CM | POA: Insufficient documentation

## 2024-03-19 DIAGNOSIS — O0990 Supervision of high risk pregnancy, unspecified, unspecified trimester: Secondary | ICD-10-CM | POA: Insufficient documentation

## 2024-03-19 NOTE — Progress Notes (Signed)
 After review, MFM consult with provider is not indicated for today  Kristi Petties, MD 03/19/2024 4:36 PM  [facility]

## 2024-03-20 ENCOUNTER — Other Ambulatory Visit: Payer: Self-pay

## 2024-03-20 ENCOUNTER — Ambulatory Visit: Attending: Cardiology

## 2024-03-20 DIAGNOSIS — Z362 Encounter for other antenatal screening follow-up: Secondary | ICD-10-CM

## 2024-03-20 DIAGNOSIS — R55 Syncope and collapse: Secondary | ICD-10-CM

## 2024-03-20 NOTE — Progress Notes (Signed)
 7 day Zio ordered per Dr. Ryan Coyer request.

## 2024-03-20 NOTE — Progress Notes (Unsigned)
 Enrolled patient for a 7 day Zio XT monitor to be mailed to patients home.
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
## 2024-03-24 ENCOUNTER — Telehealth: Payer: Self-pay

## 2024-03-24 ENCOUNTER — Ambulatory Visit: Payer: Self-pay | Admitting: Physician Assistant

## 2024-03-24 ENCOUNTER — Other Ambulatory Visit: Payer: Self-pay

## 2024-03-24 VITALS — BP 114/72 | HR 125 | Wt 141.0 lb

## 2024-03-24 DIAGNOSIS — R000 Tachycardia, unspecified: Secondary | ICD-10-CM

## 2024-03-24 DIAGNOSIS — O99891 Other specified diseases and conditions complicating pregnancy: Secondary | ICD-10-CM | POA: Diagnosis not present

## 2024-03-24 DIAGNOSIS — O0993 Supervision of high risk pregnancy, unspecified, third trimester: Secondary | ICD-10-CM

## 2024-03-24 DIAGNOSIS — O0990 Supervision of high risk pregnancy, unspecified, unspecified trimester: Secondary | ICD-10-CM

## 2024-03-24 DIAGNOSIS — S329XXD Fracture of unspecified parts of lumbosacral spine and pelvis, subsequent encounter for fracture with routine healing: Secondary | ICD-10-CM

## 2024-03-24 DIAGNOSIS — Z3A34 34 weeks gestation of pregnancy: Secondary | ICD-10-CM | POA: Diagnosis not present

## 2024-03-24 DIAGNOSIS — O26893 Other specified pregnancy related conditions, third trimester: Secondary | ICD-10-CM

## 2024-03-24 DIAGNOSIS — R079 Chest pain, unspecified: Secondary | ICD-10-CM

## 2024-03-24 DIAGNOSIS — R12 Heartburn: Secondary | ICD-10-CM

## 2024-03-24 DIAGNOSIS — R112 Nausea with vomiting, unspecified: Secondary | ICD-10-CM

## 2024-03-24 MED ORDER — FAMOTIDINE 20 MG PO TABS: 20.0000 mg | ORAL_TABLET | Freq: Two times a day (BID) | ORAL | 1 refills | Status: DC

## 2024-03-24 MED ORDER — MECLIZINE HCL 12.5 MG PO TABS: 12.5000 mg | ORAL_TABLET | Freq: Three times a day (TID) | ORAL | 2 refills | Status: DC | PRN

## 2024-03-24 NOTE — Telephone Encounter (Signed)
 Opened encounter in error. Pt concern addressed in visit on 03/24/24.    Naythan Douthit,RN

## 2024-03-24 NOTE — Telephone Encounter (Signed)
-----   Message from Abigail Abler sent at 03/19/2024 11:20 PM EDT ----- BV noted on swab, offer treatment

## 2024-03-24 NOTE — Addendum Note (Signed)
 Addended by: Loranzo Desha on: 03/24/2024 08:13 PM   Modules accepted: Orders

## 2024-03-28 ENCOUNTER — Emergency Department (HOSPITAL_COMMUNITY)
Admission: AD | Admit: 2024-03-28 | Discharge: 2024-03-28 | Disposition: A | Discharge status: Home / Self Care | Attending: Obstetrics & Gynecology | Admitting: Obstetrics & Gynecology

## 2024-03-28 ENCOUNTER — Other Ambulatory Visit: Payer: Self-pay

## 2024-03-28 ENCOUNTER — Telehealth: Payer: Self-pay | Admitting: *Deleted

## 2024-03-28 ENCOUNTER — Encounter (HOSPITAL_COMMUNITY): Payer: Self-pay | Admitting: Obstetrics & Gynecology

## 2024-03-28 ENCOUNTER — Emergency Department (HOSPITAL_COMMUNITY)

## 2024-03-28 DIAGNOSIS — R102 Pelvic and perineal pain: Secondary | ICD-10-CM | POA: Insufficient documentation

## 2024-03-28 DIAGNOSIS — O0990 Supervision of high risk pregnancy, unspecified, unspecified trimester: Secondary | ICD-10-CM

## 2024-03-28 DIAGNOSIS — M545 Low back pain, unspecified: Secondary | ICD-10-CM | POA: Insufficient documentation

## 2024-03-28 LAB — URINALYSIS, ROUTINE W REFLEX MICROSCOPIC
Bilirubin Urine: NEGATIVE
Glucose, UA: NEGATIVE mg/dL
Hgb urine dipstick: NEGATIVE
Ketones, ur: NEGATIVE mg/dL
Leukocytes,Ua: NEGATIVE
Nitrite: NEGATIVE
Protein, ur: NEGATIVE mg/dL
Specific Gravity, Urine: 1.01 (ref 1.005–1.030)
pH: 6 (ref 5.0–8.0)

## 2024-03-28 LAB — WET PREP, GENITAL
Clue Cells Wet Prep HPF POC: NONE SEEN
Sperm: NONE SEEN
Trich, Wet Prep: NONE SEEN
WBC, Wet Prep HPF POC: <10 (ref <10)
Yeast Wet Prep HPF POC: NONE SEEN

## 2024-03-28 MED ORDER — CYCLOBENZAPRINE HCL 5 MG PO TABS
10.0000 mg | ORAL_TABLET | Freq: Once | ORAL | Status: AC
  Administered 2024-03-28: 10 mg via ORAL
  Filled 2024-03-28: qty 2

## 2024-03-28 NOTE — Telephone Encounter (Signed)
 Patient sent 2 messages reporting decreased fetal movement. I called patient with [facility] Interpreter [phone]. I called her and she is at hospital now being evaluated for her complaints. Bethany Harris
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
## 2024-03-28 NOTE — MAU Note (Signed)
 MAU Triage Note  .Ezelle Partridge is a 23 y.o. at [redacted]w[redacted]d here in MAU reporting lower abd pain and lower back pain since yesterday. Reports decreased FM for 2 days. Awoke about 0100 and felt like heart was racing for an hour. Denies VB or LOF. Having white vag d/c with itching  LMP: n/a Onset of complaint: yesterday Pain score: 10 Vitals:   03/28/24 0442  BP: 98/68  Pulse: (!) 104  Resp: 18  Temp: (!) 97.4 F (36.3 C)     FHT: 145  Lab orders placed from triage: ua
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
## 2024-03-28 NOTE — ED Notes (Signed)
 Patient transported to X-ray

## 2024-03-28 NOTE — ED Provider Notes (Signed)
 [place] [facility] AT [facility] Provider Note   CSN: [id number] Arrival date & time: 03/28/24  0424     History  Chief Complaint  Patient presents with   Back Pain   Leg Pain    Pt c/o left leg and lower back pain. Pt fx her pelvis in march and has been taking oxycodone  for pain with no relief. Last night the pain became unbearable. Pt is [redacted]wks pregnant. Denies PMH.    Bethany Harris is a 23 y.o. female.  HPI Patient presents on transfer from our maternal admissions unit concern for hip/pelvis pain.  Patient's history is notable for fall that occurred last month with evaluation here.  Today she went to MAU due to abdominal pain, pelvis pain.  She had evaluation there, was seen, evaluated, sent here for evaluation.    Home Medications Prior to Admission medications   Medication Sig Start Date End Date Taking? Authorizing Provider  acetaminophen  (TYLENOL ) 500 MG tablet Take 2 tablets (1,000 mg total) by mouth every 6 (six) hours. 02/16/24   Lacey Pian, MD  cyclobenzaprine  (FLEXERIL ) 10 MG tablet Take 1 tablet (10 mg total) by mouth 3 (three) times daily. 02/16/24   Lacey Pian, MD  doxylamine , Sleep, (UNISOM ) 25 MG tablet Take 1 tablet (25 mg total) by mouth at bedtime as needed for sleep. 02/05/24   Tresia Fruit, MD  enoxaparin  (LOVENOX ) 40 MG/0.4ML injection Inject 0.4 mLs (40 mg total) into the skin daily. 02/17/24   Lacey Pian, MD  famotidine  (PEPCID ) 20 MG tablet Take 1 tablet (20 mg total) by mouth 2 (two) times daily. 03/24/24   Davis, Devon E, PA-C  meclizine  (ANTIVERT ) 12.5 MG tablet Take 1 tablet (12.5 mg total) by mouth 3 (three) times daily as needed for dizziness. Use for nausea and vomiting. 03/24/24   Davis, Devon E, PA-C  ondansetron  (ZOFRAN -ODT) 4 MG disintegrating tablet Take 4 mg by mouth every 8 (eight) hours as needed. 12/24/23   [provider]  oxyCODONE  (OXY IR/ROXICODONE ) 5 MG immediate release tablet Take 1 tablet (5 mg total)  by mouth every 4 (four) hours as needed for severe pain (pain score 7-10). Patient not taking: Reported on 03/24/2024 03/05/24   Abigail Abler, MD  Prenatal Vit-Fe Fumarate-FA (PREPLUS) 27-1 MG TABS Take 1 tablet by mouth daily. 03/05/24   Abigail Abler, MD  pyridOXINE (VITAMIN B6) 25 MG tablet Take 25 mg by mouth 3 (three) times daily as needed. 01/23/24   [provider]  SENNA-TIME 8.6 MG tablet Take 2 tablets by mouth at bedtime as needed. 11/10/23   [provider]      Allergies    Iohexol     Review of Systems   Review of Systems  Physical Exam Updated Vital Signs BP 108/64   Pulse (!) 103   Temp (!) 97.4 F (36.3 C)   Resp 18   Ht 5\' 3"  (1.6 m)   Wt 66.7 kg   LMP 07/24/2023 (Exact Date)   BMI 26.04 kg/m  Physical Exam Vitals and nursing note reviewed.  Constitutional:      General: She is not in acute distress.    Appearance: She is well-developed.  HENT:     Head: Normocephalic and atraumatic.  Eyes:     Conjunctiva/sclera: Conjunctivae normal.  Pulmonary:     Effort: Pulmonary effort is normal. No respiratory distress.     Breath sounds: No stridor.  Abdominal:     General: There is  no distension.     Comments: Gravid abdomen, no complaints  Musculoskeletal:     Comments: Patient moves all extremities spontaneously, describes pain focally in the left posterior inferior hip.  This is the area that was injured during the fall weeks ago.  Distally no complaints, no complaints contralateral leg  Skin:    General: Skin is warm and dry.  Neurological:     Mental Status: She is alert and oriented to person, place, and time.     Cranial Nerves: No cranial nerve deficit.  Psychiatric:        Mood and Affect: Mood normal.     ED Results / Procedures / Treatments   Labs (all labs ordered are listed, but only abnormal results are displayed) Labs Reviewed  URINALYSIS, ROUTINE W REFLEX MICROSCOPIC - Abnormal; Notable for the following components:       Result Value   Bacteria, UA RARE (*)    All other components within normal limits  WET PREP, GENITAL    EKG None  Radiology DG Pelvis 1-2 Views Result Date: 03/28/2024 CLINICAL DATA:  recurrent L prox thigh, inguinal pain in same area as prior fracture. EXAM: PELVIS - 1-2 VIEW COMPARISON:  February 01, 2024 FINDINGS: Redemonstrated fracture through the inferior pubic ramus with callus formation suggesting early healing. The superior pubic ramus fracture at the puboacetabular junction is not well visualized on today's plain radiography. No dislocation. There is no evidence of arthropathy or other focal bone abnormality. Soft tissues are unremarkable. Gravid uterus. IMPRESSION: Redemonstrated fracture through the inferior pubic ramus with callus formation suggesting early healing. The superior pubic ramus fracture at the puboacetabular junction is not well visualized on today's radiograph. Electronically Signed   By: Rance Burrows M.D.   On: 03/28/2024 08:57    Procedures Procedures    Medications Ordered in ED Medications  cyclobenzaprine  (FLEXERIL ) tablet 10 mg (10 mg Oral Given 03/28/24 0541)    ED Course/ Medical Decision Making/ A&P                                 Medical Decision Making Adult female arrives for concern of ongoing pelvis leg pain after being seen, evaluated at our internal admissions unit for discharge, abdominal concerns.  Obstetrics has cleared the patient from their perspective, after evaluation, and on exam here she is awake, alert, in no distress after being awakened given the early morning presentation.  Patient is distally neurovascularly unremarkable, with concern for prior fall, possible fracture, x-ray was performed after discussion on risks to fetal development. On repeat exam patient awakens easily, I reviewed the x-ray, discussed with her and her husband.  There is evidence for a healing inferior pubic rami fracture no other acute phenomena and given  preserved distal neurovascular status, reassuring exam earlier with OB, patient will follow-up with them as an outpatient.  Amount and/or Complexity of Data Reviewed Independent Historian: spouse External Data Reviewed: notes.    Details: As above Radiology: ordered and independent interpretation performed. Decision-making details documented in ED Course.  Risk Decision regarding hospitalization. Diagnosis or treatment significantly limited by social determinants of health.   Final Clinical Impression(s) / ED Diagnoses Final diagnoses:  Pain in pelvis    Rx / DC Orders ED Discharge Orders     None         Dorenda Gandy, MD 03/28/24 [phone]

## 2024-03-28 NOTE — MAU Note (Signed)
 Report given to Loetta Ringer, RN ED charge nurse. Okay to send patient to triage.

## 2024-03-31 ENCOUNTER — Other Ambulatory Visit: Payer: Self-pay

## 2024-03-31 ENCOUNTER — Ambulatory Visit: Admitting: Family Medicine

## 2024-03-31 VITALS — BP 103/75 | HR 135 | Wt 144.6 lb

## 2024-03-31 DIAGNOSIS — Z603 Acculturation difficulty: Secondary | ICD-10-CM

## 2024-03-31 DIAGNOSIS — R000 Tachycardia, unspecified: Secondary | ICD-10-CM

## 2024-03-31 DIAGNOSIS — O0993 Supervision of high risk pregnancy, unspecified, third trimester: Secondary | ICD-10-CM

## 2024-03-31 DIAGNOSIS — O0990 Supervision of high risk pregnancy, unspecified, unspecified trimester: Secondary | ICD-10-CM

## 2024-03-31 DIAGNOSIS — R079 Chest pain, unspecified: Secondary | ICD-10-CM

## 2024-03-31 DIAGNOSIS — O99891 Other specified diseases and conditions complicating pregnancy: Secondary | ICD-10-CM | POA: Diagnosis not present

## 2024-03-31 DIAGNOSIS — Z3A35 35 weeks gestation of pregnancy: Secondary | ICD-10-CM

## 2024-03-31 DIAGNOSIS — S329XXD Fracture of unspecified parts of lumbosacral spine and pelvis, subsequent encounter for fracture with routine healing: Secondary | ICD-10-CM

## 2024-03-31 DIAGNOSIS — Z758 Other problems related to medical facilities and other health care: Secondary | ICD-10-CM

## 2024-03-31 MED ORDER — CYCLOBENZAPRINE HCL 10 MG PO TABS: 10.0000 mg | ORAL_TABLET | Freq: Three times a day (TID) | ORAL | 0 refills | Status: DC

## 2024-03-31 MED ORDER — NYSTATIN 100000 UNIT/GM EX CREA
1.0000 | TOPICAL_CREAM | Freq: Four times a day (QID) | CUTANEOUS | 1 refills | Status: AC
Start: 2024-03-31 — End: 2024-04-14

## 2024-03-31 MED ORDER — FLUCONAZOLE 150 MG PO TABS: 150.0000 mg | ORAL_TABLET | Freq: Once | ORAL | 0 refills | Status: AC

## 2024-04-01 ENCOUNTER — Telehealth: Payer: Self-pay
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
## 2024-04-01 NOTE — Transitions of Care (Post Inpatient/ED Visit) (Unsigned)
 04/01/2024  Name: Bethany Harris MRN: [id number] DOB: 2000-12-25  Today's TOC FU Call Status:   Patient's Name and Date of Birth confirmed.  Transition Care Management Follow-up Telephone Call Date of Discharge: 03/28/24 Discharge Facility: [facility]) Type of Discharge: Emergency Department How have you been since you were released from the hospital?: Better Any questions or concerns?: No  Items Reviewed: Did you receive and understand the discharge instructions provided?: No Medications obtained,verified, and reconciled?: No Any new allergies since your discharge?: No Dietary orders reviewed?: No Do you have support at home?: Yes People in Home [RPT]: spouse  Medications Reviewed Today: Medications Reviewed Today     Reviewed by Angelita Bares, CMA (Certified Medical Assistant) on 04/01/24 at 1007  Med List Status: <None>   Medication Order Taking? Sig Documenting Provider Last Dose Status Informant  acetaminophen  (TYLENOL ) 500 MG tablet [id number] Yes Take 2 tablets (1,000 mg total) by mouth every 6 (six) hours. Lacey Pian, MD Taking Active Self, Pharmacy Records  cyclobenzaprine  (FLEXERIL ) 10 MG tablet [id number] Yes Take 1 tablet (10 mg total) by mouth 3 (three) times daily. Cresenzo, John V, MD Taking Active   doxylamine , Sleep, (UNISOM ) 25 MG tablet [id number] Yes Take 1 tablet (25 mg total) by mouth at bedtime as needed for sleep. Tresia Fruit, MD Taking Active Self, Pharmacy Records  enoxaparin  (LOVENOX ) 40 MG/0.4ML injection [id number] Yes Inject 0.4 mLs (40 mg total) into the skin daily. Lacey Pian, MD Taking Active Self, Pharmacy Records           Med Note Baltazar Leventhal, Alethia Huxley   Fri Mar 28, 2024  9:05 AM)    famotidine  (PEPCID ) 20 MG tablet [id number] Yes Take 1 tablet (20 mg total) by mouth 2 (two) times daily. Nolon Baxter, Devon E, PA-C Taking Active   meclizine  (ANTIVERT ) 12.5 MG tablet [id number] Yes Take 1 tablet (12.5 mg total) by mouth 3 (three) times daily as  needed for dizziness. Use for nausea and vomiting. Davis, Devon E, PA-C Taking Active   nystatin cream (MYCOSTATIN) [id number] Yes Apply 1 Application topically 4 (four) times daily for 14 days. Apply to rash 4 times daily for 2 weeks. Cresenzo, John V, MD Taking Active   ondansetron  (ZOFRAN -ODT) 4 MG disintegrating tablet [id number] Yes Take 4 mg by mouth every 8 (eight) hours as needed. [provider] Taking Active   oxyCODONE  (OXY IR/ROXICODONE ) 5 MG immediate release tablet [id number]  Take 1 tablet (5 mg total) by mouth every 4 (four) hours as needed for severe pain (pain score 7-10).  Patient not taking: Reported on 03/24/2024   Abigail Abler, MD  Active Self, Pharmacy Records  Prenatal Vit-Fe Fumarate-FA (PREPLUS) 27-1 MG TABS [id number] Yes Take 1 tablet by mouth daily. Abigail Abler, MD Taking Active Self, Pharmacy Records  pyridOXINE (VITAMIN B6) 25 MG tablet [id number] Yes Take 25 mg by mouth 3 (three) times daily as needed. [provider] Taking Active   SENNA-TIME 8.6 MG tablet [id number] Yes Take 2 tablets by mouth at bedtime as needed. [provider] Taking Active             Home Care and Equipment/Supplies: Were Home Health Services Ordered?: No Any new equipment or medical supplies ordered?: No  Functional Questionnaire: Do you need assistance with bathing/showering or dressing?: No Do you need assistance with meal preparation?: No Do you need assistance with eating?: No Do you have difficulty maintaining continence: No Do you need assistance with getting out of bed/getting  out of a chair/moving?: No Do you have difficulty managing or taking your medications?: No  Follow up appointments reviewed: PCP Follow-up appointment confirmed?: Yes Date of PCP follow-up appointment?: 04/07/24 [facility] Follow-up appointment confirmed?: No Do you need transportation to your follow-up appointment?: No Do you understand care options if your  condition(s) worsen?: Yes-patient verbalized understanding    SIGNATURE Elyna Pangilinan, RMA

## 2024-04-02 ENCOUNTER — Inpatient Hospital Stay (HOSPITAL_COMMUNITY)
Admission: AD | Admit: 2024-04-02 | Discharge: 2024-04-06 | DRG: 787 | Disposition: A | Discharge status: Home / Self Care | Attending: Obstetrics & Gynecology | Admitting: Obstetrics & Gynecology

## 2024-04-02 ENCOUNTER — Inpatient Hospital Stay (HOSPITAL_COMMUNITY): Admitting: Anesthesiology

## 2024-04-02 ENCOUNTER — Encounter (HOSPITAL_COMMUNITY): Payer: Self-pay | Admitting: Obstetrics and Gynecology

## 2024-04-02 DIAGNOSIS — S329XXD Fracture of unspecified parts of lumbosacral spine and pelvis, subsequent encounter for fracture with routine healing: Secondary | ICD-10-CM

## 2024-04-02 DIAGNOSIS — O9A22 Injury, poisoning and certain other consequences of external causes complicating childbirth: Secondary | ICD-10-CM | POA: Diagnosis present

## 2024-04-02 DIAGNOSIS — M545 Low back pain, unspecified: Secondary | ICD-10-CM | POA: Diagnosis present

## 2024-04-02 DIAGNOSIS — O42913 Preterm premature rupture of membranes, unspecified as to length of time between rupture and onset of labor, third trimester: Secondary | ICD-10-CM | POA: Diagnosis present

## 2024-04-02 DIAGNOSIS — F411 Generalized anxiety disorder: Secondary | ICD-10-CM | POA: Diagnosis present

## 2024-04-02 DIAGNOSIS — O0990 Supervision of high risk pregnancy, unspecified, unspecified trimester: Principal | ICD-10-CM

## 2024-04-02 DIAGNOSIS — O26893 Other specified pregnancy related conditions, third trimester: Secondary | ICD-10-CM | POA: Diagnosis present

## 2024-04-02 DIAGNOSIS — O9081 Anemia of the puerperium: Secondary | ICD-10-CM | POA: Diagnosis not present

## 2024-04-02 DIAGNOSIS — G8929 Other chronic pain: Secondary | ICD-10-CM | POA: Diagnosis present

## 2024-04-02 DIAGNOSIS — Z3A36 36 weeks gestation of pregnancy: Secondary | ICD-10-CM | POA: Diagnosis not present

## 2024-04-02 DIAGNOSIS — O360110 Maternal care for anti-D [Rh] antibodies, first trimester, not applicable or unspecified: Secondary | ICD-10-CM | POA: Diagnosis not present

## 2024-04-02 DIAGNOSIS — S329XXA Fracture of unspecified parts of lumbosacral spine and pelvis, initial encounter for closed fracture: Secondary | ICD-10-CM | POA: Diagnosis present

## 2024-04-02 DIAGNOSIS — F320 Major depressive disorder, single episode, mild: Secondary | ICD-10-CM | POA: Diagnosis present

## 2024-04-02 DIAGNOSIS — D62 Acute posthemorrhagic anemia: Secondary | ICD-10-CM | POA: Diagnosis present

## 2024-04-02 DIAGNOSIS — O4202 Full-term premature rupture of membranes, onset of labor within 24 hours of rupture: Secondary | ICD-10-CM | POA: Diagnosis not present

## 2024-04-02 DIAGNOSIS — O42919 Preterm premature rupture of membranes, unspecified as to length of time between rupture and onset of labor, unspecified trimester: Secondary | ICD-10-CM | POA: Diagnosis present

## 2024-04-02 DIAGNOSIS — Z98891 History of uterine scar from previous surgery: Secondary | ICD-10-CM

## 2024-04-02 LAB — CBC
HCT: 33.5 % — ABNORMAL LOW (ref 36.0–46.0)
Hemoglobin: 11.1 g/dL — ABNORMAL LOW (ref 12.0–15.0)
MCH: 26.6 pg (ref 26.0–34.0)
MCHC: 33.1 g/dL (ref 30.0–36.0)
MCV: 80.3 fL (ref 80.0–100.0)
Platelets: 197 10*3/uL (ref 150–400)
RBC: 4.17 MIL/uL (ref 3.87–5.11)
RDW: 13.3 % (ref 11.5–15.5)
WBC: 19.5 10*3/uL — ABNORMAL HIGH (ref 4.0–10.5)
nRBC: 0 % (ref 0.0–0.2)

## 2024-04-02 LAB — RPR: RPR Ser Ql: NONREACTIVE

## 2024-04-02 LAB — GROUP B STREP BY PCR: Group B strep by PCR: POSITIVE — AB

## 2024-04-02 LAB — RUPTURE OF MEMBRANE (ROM)PLUS: Rom Plus: POSITIVE

## 2024-04-02 LAB — POCT FERN TEST: POCT Fern Test: NEGATIVE

## 2024-04-02 MED ORDER — EPHEDRINE 5 MG/ML INJ: 10.0000 mg | INTRAVENOUS | Status: DC | PRN

## 2024-04-02 MED ORDER — OXYTOCIN-SODIUM CHLORIDE 30-0.9 UT/500ML-% IV SOLN
1.0000 m[IU]/min | INTRAVENOUS | Status: DC
  Administered 2024-04-03: 2 m[IU]/min via INTRAVENOUS
  Filled 2024-04-02: qty 500

## 2024-04-02 MED ORDER — FENTANYL-BUPIVACAINE-NACL 0.5-0.125-0.9 MG/250ML-% EP SOLN
12.0000 mL/h | EPIDURAL | Status: DC | PRN
  Administered 2024-04-02: 12 mL/h via EPIDURAL
  Filled 2024-04-02: qty 250

## 2024-04-02 MED ORDER — PHENYLEPHRINE 80 MCG/ML (10ML) SYRINGE FOR IV PUSH (FOR BLOOD PRESSURE SUPPORT)
80.0000 ug | PREFILLED_SYRINGE | INTRAVENOUS | Status: DC | PRN
  Filled 2024-04-02: qty 10

## 2024-04-02 MED ORDER — TERBUTALINE SULFATE 1 MG/ML IJ SOLN
0.2500 mg | Freq: Once | INTRAMUSCULAR | Status: AC | PRN
  Administered 2024-04-03: 0.25 mg via SUBCUTANEOUS
  Filled 2024-04-02: qty 1

## 2024-04-02 MED ORDER — OXYCODONE-ACETAMINOPHEN 5-325 MG PO TABS: 1.0000 | ORAL_TABLET | ORAL | Status: DC | PRN

## 2024-04-02 MED ORDER — SOD CITRATE-CITRIC ACID 500-334 MG/5ML PO SOLN
30.0000 mL | ORAL | Status: DC | PRN
  Administered 2024-04-03: 30 mL via ORAL
  Filled 2024-04-02: qty 30

## 2024-04-02 MED ORDER — OXYTOCIN BOLUS FROM INFUSION: 333.0000 mL | Freq: Once | INTRAVENOUS | Status: DC

## 2024-04-02 MED ORDER — LACTATED RINGERS IV SOLN: INTRAVENOUS | Status: DC

## 2024-04-02 MED ORDER — MISOPROSTOL 50MCG HALF TABLET
50.0000 ug | ORAL_TABLET | ORAL | Status: DC
  Administered 2024-04-02 (×2): 50 ug via BUCCAL
  Filled 2024-04-02 (×2): qty 1

## 2024-04-02 MED ORDER — OXYCODONE-ACETAMINOPHEN 5-325 MG PO TABS: 2.0000 | ORAL_TABLET | ORAL | Status: DC | PRN

## 2024-04-02 MED ORDER — PHENYLEPHRINE 80 MCG/ML (10ML) SYRINGE FOR IV PUSH (FOR BLOOD PRESSURE SUPPORT)
80.0000 ug | PREFILLED_SYRINGE | INTRAVENOUS | Status: DC | PRN
  Administered 2024-04-03: 80 ug via INTRAVENOUS
  Filled 2024-04-02: qty 10

## 2024-04-02 MED ORDER — SODIUM CHLORIDE 0.9 % IV SOLN
5.0000 10*6.[IU] | Freq: Once | INTRAVENOUS | Status: AC
  Administered 2024-04-02: 5 10*6.[IU] via INTRAVENOUS
  Filled 2024-04-02: qty 5

## 2024-04-02 MED ORDER — DIPHENHYDRAMINE HCL 50 MG/ML IJ SOLN
12.5000 mg | INTRAMUSCULAR | Status: DC | PRN
  Administered 2024-04-02: 12.5 mg via INTRAVENOUS
  Filled 2024-04-02: qty 1

## 2024-04-02 MED ORDER — LACTATED RINGERS IV SOLN
500.0000 mL | Freq: Once | INTRAVENOUS | Status: AC
  Administered 2024-04-02: 500 mL via INTRAVENOUS

## 2024-04-02 MED ORDER — FENTANYL CITRATE (PF) 100 MCG/2ML IJ SOLN
50.0000 ug | INTRAMUSCULAR | Status: DC | PRN
  Administered 2024-04-02 (×5): 100 ug via INTRAVENOUS
  Filled 2024-04-02 (×5): qty 2

## 2024-04-02 MED ORDER — LACTATED RINGERS IV SOLN
500.0000 mL | INTRAVENOUS | Status: DC | PRN
  Administered 2024-04-02 (×3): 500 mL via INTRAVENOUS

## 2024-04-02 MED ORDER — ACETAMINOPHEN 325 MG PO TABS: 650.0000 mg | ORAL_TABLET | ORAL | Status: DC | PRN

## 2024-04-02 MED ORDER — ONDANSETRON HCL 4 MG/2ML IJ SOLN
4.0000 mg | Freq: Four times a day (QID) | INTRAMUSCULAR | Status: DC | PRN
  Administered 2024-04-02 – 2024-04-03 (×3): 4 mg via INTRAVENOUS
  Filled 2024-04-02 (×3): qty 2

## 2024-04-02 MED ORDER — LIDOCAINE HCL (PF) 1 % IJ SOLN
INTRAMUSCULAR | Status: DC | PRN
  Administered 2024-04-02: 8 mL via EPIDURAL

## 2024-04-02 MED ORDER — LIDOCAINE HCL (PF) 1 % IJ SOLN: 30.0000 mL | INTRAMUSCULAR | Status: DC | PRN

## 2024-04-02 MED ORDER — PENICILLIN G POT IN DEXTROSE 60000 UNIT/ML IV SOLN
3.0000 10*6.[IU] | INTRAVENOUS | Status: DC
  Administered 2024-04-02 – 2024-04-03 (×5): 3 10*6.[IU] via INTRAVENOUS
  Filled 2024-04-02 (×5): qty 50

## 2024-04-02 MED ORDER — FLEET ENEMA RE ENEM: 1.0000 | ENEMA | RECTAL | Status: DC | PRN

## 2024-04-02 MED ORDER — OXYTOCIN-SODIUM CHLORIDE 30-0.9 UT/500ML-% IV SOLN: 2.5000 [IU]/h | INTRAVENOUS | Status: DC

## 2024-04-02 NOTE — Progress Notes (Signed)
 Patient ID: Bethany Harris, female   DOB: Nov 19, 2001, 23 y.o.   MRN: [id number]  Amb in halls; s/p cytotec x 2 doses; feeling cramping and breathing w ctx  VSS, afebrile FHR 150s, +accels, variables when lying flat on back for foley placement Ctx q 1-3 mins Cx 1/thick/vtx -3  IUP@36 .1wks PROM x 32h Cx unfavorable  Cervical foley inserted and inflated w 60cc fluid; will hold on cytotec for now Plan for Pitocin when it comes out  Jolayne Natter [facility] 04/02/2024 7:55 PM

## 2024-04-02 NOTE — MAU Note (Signed)
 MAU Triage Note  .Bethany Harris is a 23 y.o. at [redacted]w[redacted]d here in MAU reporting:  At noon, pt reports feeling a gush of fluid. Clear. No bleeding.  Ctxs have become stronger 5-8 min apart.  +Fm.   Pain score: 10/10 lower abdominal pain/lower back pain ctxs   FHT: 141  Lab orders placed from triage: Fern slide  Vitals:   04/02/24 0330  BP: 111/72  Pulse: (!) 128  Resp: 17  Temp: 97.6 F (36.4 C)  SpO2: 100%

## 2024-04-02 NOTE — ED Notes (Signed)
 Bethany Harris was seen in our department on 04/02/2024. He may return to work on 04/09/2024.

## 2024-04-02 NOTE — MAU Note (Signed)

## 2024-04-02 NOTE — MAU Note (Addendum)
 According to the patient, she had her most recent Lovenox  injection on May 5, 25. The patient reports she was in terrible pain and did not take her lovenox  injection on 04/01/24.    Scherrie Curt, MD aware.

## 2024-04-02 NOTE — Telephone Encounter (Signed)
 Completed.

## 2024-04-02 NOTE — Anesthesia Procedure Notes (Signed)
 Epidural Patient location during procedure: OB Start time: 04/02/2024 9:14 PM End time: 04/02/2024 9:23 PM  Staffing Anesthesiologist: Jacquelyne Matte, DO Performed: anesthesiologist   Preanesthetic Checklist Completed: patient identified, IV checked, risks and benefits discussed, monitors and equipment checked, pre-op evaluation and timeout performed  Epidural Patient position: sitting Prep: DuraPrep and site prepped and draped Patient monitoring: continuous pulse ox, blood pressure, heart rate and cardiac monitor Approach: midline Location: L3-L4 Injection technique: LOR air  Needle:  Needle type: Tuohy  Needle gauge: 17 G Needle length: 9 cm Needle insertion depth: 6 cm Catheter type: closed end flexible Catheter size: 19 Gauge Catheter at skin depth: 11 cm Test dose: negative  Assessment Sensory level: T8 Events: blood not aspirated, no cerebrospinal fluid, injection not painful, no injection resistance, no paresthesia and negative IV test  Additional Notes Patient identified. Risks/Benefits/Options discussed with patient including but not limited to bleeding, infection, nerve damage, paralysis, failed block, incomplete pain control, headache, blood pressure changes, nausea, vomiting, reactions to medication both or allergic, itching and postpartum back pain. Confirmed with bedside nurse the patient's most recent platelet count. Confirmed with patient that they are not currently taking any anticoagulation, have any bleeding history or any family history of bleeding disorders. Patient expressed understanding and wished to proceed. All questions were answered. Sterile technique was used throughout the entire procedure. Please see nursing notes for vital signs. Test dose was given through epidural catheter and negative prior to continuing to dose epidural or start infusion. Warning signs of high block given to the patient including shortness of breath, tingling/numbness in  hands, complete motor block, or any concerning symptoms with instructions to call for help. Patient was given instructions on fall risk and not to get out of bed. All questions and concerns addressed with instructions to call with any issues or inadequate analgesia.  Reason for block:procedure for pain

## 2024-04-02 NOTE — Progress Notes (Signed)
 Bethany Harris is a 23 y.o. G1P0000 at [redacted]w[redacted]d.  Subjective: Mild cramping. UNsure if contractions are less strong or if pain is still decreased from pain meds.   Objective: BP 107/71   Pulse (!) 113   Temp 97.8 F (36.6 C) (Oral)   Resp 18   Ht 5\' 6"  (1.676 m)   Wt 66.7 kg   LMP 07/24/2023 (Exact Date)   SpO2 99%   BMI 23.73 kg/m    FHT:  FHR: 140 bpm, variability: mod,  accelerations:  15x15,  decelerations:  none UC:  irreg, mild Dilation: Fingertip Effacement (%): 50 Cervical Position: Posterior Station: Ballotable Presentation: Vertex Exam by:: Taren Dymek CNM Attempted Foley placement but pt could not tolerate.   Labs: Results for orders placed or performed during the hospital encounter of 04/02/24 (from the past 24 hours)  POCT fern test     Status: None   Collection Time: 04/02/24  3:38 AM  Result Value Ref Range   POCT Fern Test Negative = intact amniotic membranes   Rupture of Membrane (ROM) Plus     Status: None   Collection Time: 04/02/24  3:59 AM  Result Value Ref Range   Rom Plus POSITIVE   CBC     Status: Abnormal   Collection Time: 04/02/24  4:50 AM  Result Value Ref Range   WBC 19.5 (H) 4.0 - 10.5 K/uL   RBC 4.17 3.87 - 5.11 MIL/uL   Hemoglobin 11.1 (L) 12.0 - 15.0 g/dL   HCT 40.9 (L) 81.1 - 91.4 %   MCV 80.3 80.0 - 100.0 fL   MCH 26.6 26.0 - 34.0 pg   MCHC 33.1 30.0 - 36.0 g/dL   RDW 78.2 95.6 - 21.3 %   Platelets 197 150 - 400 K/uL   nRBC 0.0 0.0 - 0.2 %  Type and screen [facility]     Status: None   Collection Time: 04/02/24  4:50 AM  Result Value Ref Range   ABO/RH(D) O POS    Antibody Screen NEG    Sample Expiration      04/05/2024,2359 Performed at [facility], [address], [place], [place] [address]   RPR     Status: None   Collection Time: 04/02/24  4:50 AM  Result Value Ref Range   RPR Ser Ql NON REACTIVE NON REACTIVE  Group B strep by PCR     Status: Abnormal   Collection Time: 04/02/24  4:50 AM    Specimen: Vaginal/Rectal; Genital  Result Value Ref Range   Group B strep by PCR POSITIVE (A) PRESUMPTIVE NEGATIVE    Assessment / Plan: [redacted]w[redacted]d week IUP ROM x 20 hours Labor: Early, not progressing spontaneously. Recommend augmentation. Discussed increasing risk of Chorio with longer ROM. Pt agrees. Start Cytotec 50 Buccal Q4.  Fetal Wellbeing:  Category I Pain Control:  Fentanyl  Anticipated MOD:  SVD Hx closed pelvic Fx this pregnancy: Able to ambulate well. No restrictions per Ortho (See 5/5 ROB note) or due to Pt's pain.   Felipe Horton, Ricahrd Schwager , CNM 04/02/2024 9:46 AM

## 2024-04-02 NOTE — Progress Notes (Signed)
 Bethany Harris is a 23 y.o. G1P0000 at [redacted]w[redacted]d.  Subjective: Mod cramping. Pain controlled w/ Fentanyl .   Objective: BP 107/71   Pulse (!) 113   Temp 97.8 F (36.6 C) (Oral)   Resp 18   Ht 5\' 6"  (1.676 m)   Wt 66.7 kg   LMP 07/24/2023 (Exact Date)   SpO2 99%   BMI 23.73 kg/m    FHT:  FHR: 145 bpm, variability: mod,  accelerations:  15x15,  decelerations:  none UC:   Q 2-3 minutes, mild- mod Dilation: Fingertip Effacement (%): 50 Cervical Position: Posterior Station: Ballotable Presentation: Vertex Exam by:: Bethany Harris CNM  Labs: NA  Assessment / Plan: [redacted]w[redacted]d week IUP ROM x Days: 1 Hours: 3 Minutes: 32 Labor: Early, no progress on Cytotec x 1. Contracting more frequently but not very uncomfortable. Continue Cyto. Reconsider Foley if not change next exam. Will offer premedicate pt. Fetal Wellbeing:  Category I Pain Control:  Fentanyl  Anticipated MOD:  SVD Hx closed pelvic Fx this pregnancy: Able to ambulate well. No restrictions per Ortho (See 5/5 ROB note) or due to Pt's pain. Consider early Epidural.   Kahleah Crass , CNM 04/02/2024 3:32 PM
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
## 2024-04-03 ENCOUNTER — Encounter (HOSPITAL_COMMUNITY): Payer: Self-pay | Admitting: Obstetrics and Gynecology

## 2024-04-03 ENCOUNTER — Encounter (HOSPITAL_COMMUNITY)
Admission: AD | Disposition: A | Discharge status: Home / Self Care | Payer: Self-pay | Source: Home / Self Care | Attending: Obstetrics & Gynecology

## 2024-04-03 DIAGNOSIS — Z3A36 36 weeks gestation of pregnancy: Secondary | ICD-10-CM

## 2024-04-03 DIAGNOSIS — O4202 Full-term premature rupture of membranes, onset of labor within 24 hours of rupture: Secondary | ICD-10-CM

## 2024-04-03 DIAGNOSIS — O360110 Maternal care for anti-D [Rh] antibodies, first trimester, not applicable or unspecified: Secondary | ICD-10-CM

## 2024-04-03 LAB — CBC WITH DIFFERENTIAL/PLATELET

## 2024-04-03 SURGERY — Surgical Case
Anesthesia: Epidural

## 2024-04-03 MED ORDER — PRENATAL MULTIVITAMIN CH
1.0000 | ORAL_TABLET | Freq: Every day | ORAL | Status: DC
  Administered 2024-04-03 – 2024-04-06 (×4): 1 via ORAL
  Filled 2024-04-03 (×4): qty 1

## 2024-04-03 MED ORDER — ONDANSETRON HCL 4 MG/2ML IJ SOLN: 4.0000 mg | Freq: Once | INTRAMUSCULAR | Status: DC | PRN

## 2024-04-03 MED ORDER — NALOXONE HCL 0.4 MG/ML IJ SOLN: 0.4000 mg | INTRAMUSCULAR | Status: DC | PRN

## 2024-04-03 MED ORDER — GABAPENTIN 100 MG PO CAPS
200.0000 mg | ORAL_CAPSULE | Freq: Three times a day (TID) | ORAL | Status: DC
  Administered 2024-04-03 – 2024-04-06 (×10): 200 mg via ORAL
  Filled 2024-04-03 (×10): qty 2

## 2024-04-03 MED ORDER — LIDOCAINE-EPINEPHRINE (PF) 2 %-1:200000 IJ SOLN
INTRAMUSCULAR | Status: DC | PRN
  Administered 2024-04-03: 4 mL via EPIDURAL
  Administered 2024-04-03: 10 mL via EPIDURAL

## 2024-04-03 MED ORDER — SODIUM CHLORIDE 0.9 % IR SOLN
Status: DC | PRN
  Administered 2024-04-03: 1

## 2024-04-03 MED ORDER — MEDROXYPROGESTERONE ACETATE 150 MG/ML IM SUSP: 150.0000 mg | INTRAMUSCULAR | Status: DC | PRN

## 2024-04-03 MED ORDER — MORPHINE SULFATE (PF) 0.5 MG/ML IJ SOLN
INTRAMUSCULAR | Status: AC
  Filled 2024-04-03: qty 10

## 2024-04-03 MED ORDER — MENTHOL 3 MG MT LOZG: 1.0000 | LOZENGE | OROMUCOSAL | Status: DC | PRN

## 2024-04-03 MED ORDER — DEXMEDETOMIDINE HCL IN NACL 80 MCG/20ML IV SOLN
INTRAVENOUS | Status: DC | PRN
Start: 2024-04-03 — End: 2024-04-03
  Administered 2024-04-03: 12 ug via INTRAVENOUS
  Administered 2024-04-03: 8 ug via INTRAVENOUS

## 2024-04-03 MED ORDER — COCONUT OIL OIL
1.0000 | TOPICAL_OIL | Status: DC | PRN
  Administered 2024-04-04: 1 via TOPICAL

## 2024-04-03 MED ORDER — ONDANSETRON HCL 4 MG/2ML IJ SOLN: 4.0000 mg | Freq: Three times a day (TID) | INTRAMUSCULAR | Status: DC | PRN

## 2024-04-03 MED ORDER — LACTATED RINGERS IV BOLUS
500.0000 mL | Freq: Once | INTRAVENOUS | Status: AC
  Administered 2024-04-03: 500 mL via INTRAVENOUS

## 2024-04-03 MED ORDER — OXYCODONE HCL 5 MG/5ML PO SOLN: 5.0000 mg | Freq: Once | ORAL | Status: DC | PRN

## 2024-04-03 MED ORDER — OXYCODONE HCL 5 MG PO TABS
5.0000 mg | ORAL_TABLET | ORAL | Status: DC | PRN
  Administered 2024-04-04: 10 mg via ORAL
  Administered 2024-04-04: 5 mg via ORAL
  Filled 2024-04-03: qty 2
  Filled 2024-04-03: qty 1
  Filled 2024-04-03: qty 2

## 2024-04-03 MED ORDER — SCOPOLAMINE 1 MG/3DAYS TD PT72: 1.0000 | MEDICATED_PATCH | Freq: Once | TRANSDERMAL | Status: DC

## 2024-04-03 MED ORDER — SODIUM CHLORIDE 0.9% FLUSH: 3.0000 mL | INTRAVENOUS | Status: DC | PRN

## 2024-04-03 MED ORDER — KETOROLAC TROMETHAMINE 30 MG/ML IJ SOLN: 30.0000 mg | Freq: Four times a day (QID) | INTRAMUSCULAR | Status: DC | PRN

## 2024-04-03 MED ORDER — PHENYLEPHRINE 80 MCG/ML (10ML) SYRINGE FOR IV PUSH (FOR BLOOD PRESSURE SUPPORT)
PREFILLED_SYRINGE | INTRAVENOUS | Status: DC | PRN
  Administered 2024-04-03 (×5): 80 ug via INTRAVENOUS

## 2024-04-03 MED ORDER — MEPERIDINE HCL 25 MG/ML IJ SOLN: 6.2500 mg | INTRAMUSCULAR | Status: DC | PRN

## 2024-04-03 MED ORDER — KETOROLAC TROMETHAMINE 30 MG/ML IJ SOLN
30.0000 mg | Freq: Four times a day (QID) | INTRAMUSCULAR | Status: AC
  Administered 2024-04-03 – 2024-04-04 (×3): 30 mg via INTRAVENOUS
  Filled 2024-04-03 (×4): qty 1

## 2024-04-03 MED ORDER — FENTANYL CITRATE (PF) 100 MCG/2ML IJ SOLN
INTRAMUSCULAR | Status: DC | PRN
  Administered 2024-04-03: 100 ug via EPIDURAL

## 2024-04-03 MED ORDER — LACTATED RINGERS IV SOLN
INTRAVENOUS | Status: DC
Start: 2024-04-03 — End: 2024-04-06

## 2024-04-03 MED ORDER — ACETAMINOPHEN 500 MG PO TABS
1000.0000 mg | ORAL_TABLET | Freq: Four times a day (QID) | ORAL | Status: AC
Start: 2024-04-03 — End: 2024-04-04
  Administered 2024-04-03 – 2024-04-04 (×4): 1000 mg via ORAL
  Filled 2024-04-03 (×4): qty 2

## 2024-04-03 MED ORDER — PHENYLEPHRINE 80 MCG/ML (10ML) SYRINGE FOR IV PUSH (FOR BLOOD PRESSURE SUPPORT)
PREFILLED_SYRINGE | INTRAVENOUS | Status: AC
Start: 2024-04-03 — End: ?
  Filled 2024-04-03: qty 10

## 2024-04-03 MED ORDER — DEXAMETHASONE SODIUM PHOSPHATE 10 MG/ML IJ SOLN
INTRAMUSCULAR | Status: DC | PRN
  Administered 2024-04-03: 10 mg via INTRAVENOUS

## 2024-04-03 MED ORDER — HYDROMORPHONE HCL 1 MG/ML IJ SOLN: 0.2500 mg | INTRAMUSCULAR | Status: DC | PRN

## 2024-04-03 MED ORDER — FENTANYL CITRATE (PF) 100 MCG/2ML IJ SOLN
INTRAMUSCULAR | Status: AC
  Filled 2024-04-03: qty 2

## 2024-04-03 MED ORDER — OXYCODONE HCL 5 MG PO TABS: 5.0000 mg | ORAL_TABLET | Freq: Once | ORAL | Status: DC | PRN

## 2024-04-03 MED ORDER — AMISULPRIDE (ANTIEMETIC) 5 MG/2ML IV SOLN: 10.0000 mg | Freq: Once | INTRAVENOUS | Status: DC | PRN

## 2024-04-03 MED ORDER — ACETAMINOPHEN 325 MG PO TABS
650.0000 mg | ORAL_TABLET | ORAL | Status: DC | PRN
  Administered 2024-04-04 – 2024-04-05 (×4): 650 mg via ORAL
  Filled 2024-04-03 (×5): qty 2

## 2024-04-03 MED ORDER — SODIUM CHLORIDE 0.9 % IV SOLN
INTRAVENOUS | Status: DC | PRN
  Administered 2024-04-03: 500 mg via INTRAVENOUS

## 2024-04-03 MED ORDER — OXYTOCIN-SODIUM CHLORIDE 30-0.9 UT/500ML-% IV SOLN: 2.5000 [IU]/h | INTRAVENOUS | Status: AC

## 2024-04-03 MED ORDER — NALOXONE HCL 4 MG/10ML IJ SOLN: 1.0000 ug/kg/h | INTRAVENOUS | Status: DC | PRN

## 2024-04-03 MED ORDER — ENOXAPARIN SODIUM 40 MG/0.4ML IJ SOSY
40.0000 mg | PREFILLED_SYRINGE | INTRAMUSCULAR | Status: DC
  Administered 2024-04-05: 40 mg via SUBCUTANEOUS
  Filled 2024-04-03 (×2): qty 0.4

## 2024-04-03 MED ORDER — KETOROLAC TROMETHAMINE 30 MG/ML IJ SOLN
30.0000 mg | Freq: Once | INTRAMUSCULAR | Status: AC | PRN
  Administered 2024-04-03: 30 mg via INTRAVENOUS

## 2024-04-03 MED ORDER — TETANUS-DIPHTH-ACELL PERTUSSIS 5-2.5-18.5 LF-MCG/0.5 IM SUSY: 0.5000 mL | PREFILLED_SYRINGE | Freq: Once | INTRAMUSCULAR | Status: DC

## 2024-04-03 MED ORDER — IBUPROFEN 600 MG PO TABS
600.0000 mg | ORAL_TABLET | Freq: Four times a day (QID) | ORAL | Status: DC
  Administered 2024-04-04 – 2024-04-06 (×8): 600 mg via ORAL
  Filled 2024-04-03 (×8): qty 1

## 2024-04-03 MED ORDER — SENNOSIDES-DOCUSATE SODIUM 8.6-50 MG PO TABS
2.0000 | ORAL_TABLET | Freq: Every day | ORAL | Status: DC
  Administered 2024-04-04 – 2024-04-06 (×3): 2 via ORAL
  Filled 2024-04-03 (×3): qty 2

## 2024-04-03 MED ORDER — CEFAZOLIN SODIUM-DEXTROSE 2-3 GM-%(50ML) IV SOLR
INTRAVENOUS | Status: DC | PRN
  Administered 2024-04-03: 2 g via INTRAVENOUS

## 2024-04-03 MED ORDER — FENTANYL CITRATE (PF) 100 MCG/2ML IJ SOLN
INTRAMUSCULAR | Status: DC | PRN
  Administered 2024-04-03: 100 ug via INTRAVENOUS

## 2024-04-03 MED ORDER — DIPHENHYDRAMINE HCL 50 MG/ML IJ SOLN: 12.5000 mg | INTRAMUSCULAR | Status: DC | PRN

## 2024-04-03 MED ORDER — ONDANSETRON HCL 4 MG/2ML IJ SOLN
INTRAMUSCULAR | Status: DC | PRN
  Administered 2024-04-03: 4 mg via INTRAVENOUS

## 2024-04-03 MED ORDER — DIPHENHYDRAMINE HCL 25 MG PO CAPS: 25.0000 mg | ORAL_CAPSULE | Freq: Four times a day (QID) | ORAL | Status: DC | PRN

## 2024-04-03 MED ORDER — SIMETHICONE 80 MG PO CHEW
80.0000 mg | CHEWABLE_TABLET | Freq: Three times a day (TID) | ORAL | Status: DC
  Administered 2024-04-03 – 2024-04-06 (×9): 80 mg via ORAL
  Filled 2024-04-03 (×9): qty 1

## 2024-04-03 MED ORDER — WITCH HAZEL-GLYCERIN EX PADS: 1.0000 | MEDICATED_PAD | CUTANEOUS | Status: DC | PRN

## 2024-04-03 MED ORDER — MEASLES, MUMPS & RUBELLA VAC IJ SOLR: 0.5000 mL | Freq: Once | INTRAMUSCULAR | Status: DC

## 2024-04-03 MED ORDER — OXYTOCIN-SODIUM CHLORIDE 30-0.9 UT/500ML-% IV SOLN
INTRAVENOUS | Status: DC | PRN
  Administered 2024-04-03: 300 mL via INTRAVENOUS

## 2024-04-03 MED ORDER — SIMETHICONE 80 MG PO CHEW: 80.0000 mg | CHEWABLE_TABLET | ORAL | Status: DC | PRN

## 2024-04-03 MED ORDER — DIPHENHYDRAMINE HCL 25 MG PO CAPS: 25.0000 mg | ORAL_CAPSULE | ORAL | Status: DC | PRN

## 2024-04-03 MED ORDER — DIBUCAINE (PERIANAL) 1 % EX OINT: 1.0000 | TOPICAL_OINTMENT | CUTANEOUS | Status: DC | PRN

## 2024-04-03 MED ORDER — PHENYLEPHRINE 80 MCG/ML (10ML) SYRINGE FOR IV PUSH (FOR BLOOD PRESSURE SUPPORT): PREFILLED_SYRINGE | INTRAVENOUS | Status: DC | PRN

## 2024-04-03 MED ORDER — KETOROLAC TROMETHAMINE 30 MG/ML IJ SOLN
INTRAMUSCULAR | Status: AC
  Filled 2024-04-03: qty 1

## 2024-04-03 MED ORDER — EPHEDRINE 5 MG/ML INJ
INTRAVENOUS | Status: AC
  Filled 2024-04-03: qty 5

## 2024-04-03 MED ORDER — MORPHINE SULFATE (PF) 0.5 MG/ML IJ SOLN
INTRAMUSCULAR | Status: DC | PRN
  Administered 2024-04-03: 3 mg via EPIDURAL

## 2024-04-03 MED ORDER — ACETAMINOPHEN 10 MG/ML IV SOLN
INTRAVENOUS | Status: DC | PRN
Start: 2024-04-03 — End: 2024-04-03
  Administered 2024-04-03: 1000 mg via INTRAVENOUS

## 2024-04-03 SURGICAL SUPPLY — 30 items
BENZOIN TINCTURE PRP APPL 2/3 (GAUZE/BANDAGES/DRESSINGS) ×1 IMPLANT
CHLORAPREP W/TINT 26 (MISCELLANEOUS) ×2 IMPLANT
CLAMP UMBILICAL CORD (MISCELLANEOUS) ×1 IMPLANT
CLOTH BEACON ORANGE TIMEOUT ST (SAFETY) ×1 IMPLANT
DERMABOND ADVANCED .7 DNX12 (GAUZE/BANDAGES/DRESSINGS) ×1 IMPLANT
DRSG OPSITE POSTOP 4X10 (GAUZE/BANDAGES/DRESSINGS) ×1 IMPLANT
ELECTRODE REM PT RTRN 9FT ADLT (ELECTROSURGICAL) ×1 IMPLANT
EXTRACTOR VACUUM KIWI (MISCELLANEOUS) IMPLANT
GLOVE BIOGEL PI IND STRL 7.0 (GLOVE) ×3 IMPLANT
GLOVE ECLIPSE 6.5 STRL STRAW (GLOVE) ×1 IMPLANT
GOWN STRL REUS W/TWL LRG LVL3 (GOWN DISPOSABLE) ×3 IMPLANT
KIT ABG SYR 3ML LUER SLIP (SYRINGE) IMPLANT
NDL HYPO 25X5/8 SAFETYGLIDE (NEEDLE) IMPLANT
NEEDLE HYPO 25X5/8 SAFETYGLIDE (NEEDLE) IMPLANT
NS IRRIG 1000ML POUR BTL (IV SOLUTION) ×1 IMPLANT
PACK C SECTION WH (CUSTOM PROCEDURE TRAY) ×1 IMPLANT
PAD ABD 7.5X8 STRL (GAUZE/BANDAGES/DRESSINGS) ×1 IMPLANT
PAD OB MATERNITY 4.3X12.25 (PERSONAL CARE ITEMS) ×1 IMPLANT
RTRCTR C-SECT PINK 25CM LRG (MISCELLANEOUS) ×1 IMPLANT
STRIP CLOSURE SKIN 1/2X4 (GAUZE/BANDAGES/DRESSINGS) IMPLANT
SUT PLAIN 0 NONE (SUTURE) IMPLANT
SUT PLAIN 2 0 XLH (SUTURE) IMPLANT
SUT VIC AB 0 CT1 27XBRD ANBCTR (SUTURE) ×2 IMPLANT
SUT VIC AB 0 CTX36XBRD ANBCTRL (SUTURE) ×3 IMPLANT
SUT VIC AB 2-0 CT1 TAPERPNT 27 (SUTURE) ×1 IMPLANT
SUT VIC AB 3-0 SH 27XBRD (SUTURE) ×1 IMPLANT
SUT VIC AB 4-0 KS 27 (SUTURE) ×1 IMPLANT
TOWEL OR 17X24 6PK STRL BLUE (TOWEL DISPOSABLE) ×1 IMPLANT
TRAY FOLEY W/BAG SLVR 14FR LF (SET/KITS/TRAYS/PACK) IMPLANT
WATER STERILE IRR 1000ML POUR (IV SOLUTION) ×1 IMPLANT

## 2024-04-03 NOTE — Progress Notes (Signed)
 Patient ID: Bethany Harris, female   DOB: [birth year], 23 y.o.   MRN: [id number]  Comfortable w epidural in place; s/p cervical foley at 0130 and Pitocin was started; s/p PCN x 5 doses for GBS pending and prematurity  VSS, afebrile (T max 98.3) FHR 140s, +accels, occ mi variables Ctx q 2-3 mins with Pit @ 72mu/min Cx was 4-5/70/vtx -2 per RN at start of Pit  IUP@ 36.2wks PPROM x 36h IOL process  Plan to uptitrate Pit to achieve active labor Anticipate vag delivery Watch for s/s infection  Jolayne Natter CNM 04/03/2024 1:51 AM

## 2024-04-03 NOTE — Transfer of Care (Signed)
 Immediate Anesthesia Transfer of Care Note  Patient: Bethany Harris  Procedure(s) Performed: CESAREAN DELIVERY  Patient Location: PACU  Anesthesia Type:Epidural  Level of Consciousness: awake, alert , and oriented  Airway & Oxygen Therapy: Patient Spontanous Breathing  Post-op Assessment: Report given to RN and Post -op Vital signs reviewed and stable  Post vital signs: Reviewed and stable  Last Vitals:  Vitals Value Taken Time  BP 93/44 04/03/24 0517  Temp 36.7 C 04/03/24 0512  Pulse 126 04/03/24 0524  Resp 29 04/03/24 0524  SpO2 98 % 04/03/24 0524  Vitals shown include unfiled device data.  Last Pain:  Vitals:   04/03/24 0512  TempSrc: Oral  PainSc: 0-No pain         Complications: No notable events documented.
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
## 2024-04-03 NOTE — Op Note (Signed)
 Bethany Harris PROCEDURE DATE: 04/03/2024  PREOPERATIVE DIAGNOSES: Intrauterine pregnancy at [redacted]w[redacted]d weeks gestation; Cat 2 tracing- non-reassuring fetal well being  POSTOPERATIVE DIAGNOSES: The same  PROCEDURE: Primary Low Transverse Cesarean Section  SURGEON:  Dr. Keene Pastures Assistant: Dr. Jerlean Mood  ANESTHESIOLOGY TEAM: Anesthesiologist: Jacquelyne Matte, DO CRNA: Bernell Brigham, CRNA; Anita Kerry, CRNA  INDICATIONS: Bethany Harris is a 23 y.o. G1P0101 at [redacted]w[redacted]d here for cesarean section secondary to the indications listed under preoperative diagnoses; please see preoperative note for further details.  The risks of surgery were discussed with the patient including but were not limited to: bleeding which may require transfusion or reoperation; infection which may require antibiotics; injury to bowel, bladder, ureters or other surrounding organs; injury to the fetus; need for additional procedures including hysterectomy in the event of a life-threatening hemorrhage; formation of adhesions; placental abnormalities wth subsequent pregnancies; incisional problems; thromboembolic phenomenon and other postoperative/anesthesia complications.  The patient concurred with the proposed plan, giving informed written consent for the procedure.    FINDINGS:  Viable female infant in cephalic presentation.  Apgars 9 and 9.  Amniotic fluid: clear.  Intact placenta, three vessel cord.  Normal uterus, fallopian tubes and ovaries bilaterally.  ANESTHESIA: epidural INTRAVENOUS FLUIDS: 800 ml   ESTIMATED BLOOD LOSS: 513 ml URINE OUTPUT:  200 ml SPECIMENS: Placenta sent to L&D  COMPLICATIONS: None immediate  PROCEDURE IN DETAIL:  The patient preoperatively received intravenous antibiotics and had sequential compression devices applied to her lower extremities.  She was then taken to the operating room where the epidural was dosed up to a surgical level and found to be adequate. She was then placed in a  dorsal supine position with a leftward tilt, and prepped and draped in a sterile manner.  A foley catheter was  already in place.  After an adequate timeout was performed, a Pfannenstiel skin incision was made with scalpel and carried through to the underlying layer of fascia. The fascia was incised in the midline, and this incision was extended bluntly. The rectus muscles were separated in the midline and the peritoneum was entered bluntly.   The Alexis self-retaining retractor was introduced into the abdominal cavity.  Attention was turned to the lower uterine segment where a low transverse hysterotomy was made with a scalpel and extended bilaterally bluntly.  The infant was successfully delivered, the cord was clamped and cut after one minute, and the infant was handed over to the awaiting neonatology team. Uterine massage was then administered, and the placenta delivered intact with a three-vessel cord. The uterus was then cleared of clots and debris.  The hysterotomy was closed with 0 Vicryl in a running fashion.    The pelvis was cleared of all clot and debris. Hemostasis was confirmed on all surfaces.  The retractor was removed.  The peritoneum was closed with a 2-0 Vicryl running stitch. The fascia was then closed using 0 Vicryl in a running fashion.  The subcutaneous layer was irrigated, and was found to be hemostatic. The skin was closed with a 4-0 monocryl subcuticular stitch. The patient tolerated the procedure well. Sponge, instrument and needle counts were correct x 3.  She was taken to the recovery room in stable condition.   Tremaine Earwood, DO Attending Obstetrician & Gynecologist, [facility] for [employer], [facility]

## 2024-04-03 NOTE — Progress Notes (Signed)
 Called by RN with report of patient feeling dizzy. Has not been up to void since removal of catheter >5 hours ago. RN concerned due to soft blood pressures, decreased from baseline. Patient slightly tachycardic at 117, 98.9 temp, and 98/47.  Uterus slightly boggy and deviated to her right. RN advised to get bedpan due to dizziness and no void since removal of urinary catheter. May bladder scan if no void.  Orders for IV bolus of 500mL, and CBC.  Continue to observe.  Raford Bunk, MSN, CNM, RNC-OB Certified Nurse Midwife, [facility] 04/03/2024 10:41 PM

## 2024-04-03 NOTE — Lactation Note (Signed)
 This note was copied from a baby's chart. Lactation Consultation Note  Patient Name: Bethany Harris [name] Date: 04/03/2024 Age:23 hours Reason for consult: Initial assessment;Primapara;1st time breastfeeding;Late-preterm 34-36.6wks MOB declines Dari interpreter for [facility] consult.  P1- Infant was born at [redacted]w[redacted]d weighing [phone]. LPI feeding guidelines set in place due to infant's gestational age. MOB reports that infant has not been interested in her breast, but she seems to tolerate the bottles of formula. LC reviewed reasons why infant is so sleepy; the first 24 hr birthday nap and infant is an LPI infant. LC encouraged setting up the hospital DEBP for the times that infant is too sleepy to latch. MOB consented. LC set up the hospital DEBP with size 18 mm flanges. LC reviewed how to use the pump and how to clean it. LC offered to pump with MOB at this time, but she declines because she is sleepy. At this time, it was time for infant to eat, so LC offered to assist with a latch. MOB declined again, but allowed LC to bottle feed infant. LC placed infant in a side lying position to pace feed. LC bottle fed infant for 15 minutes while using the yellow hospital throw away nipple. Infant drank 12 mL total. Overall infant fed well from the yellow nipple. Spillage was only noted after infant burped. MOB denies having any questions or concerns at this time.  Feeding plan as follows: -Pump for 15-20 min after every feeding (every 3 hrs). -Supplement with every feeding. Day 1 is a minimum of 14 mL, day 2 is 21 mL and day 3 is 28 mL.   LC reviewed the first 24 hr birthday nap, day 2 cluster feeding, feeding infant on cue 8-12x in 24 hrs, not allowing infant to go over 3 hrs without a feeding, CDC milk storage guidelines, LC services handout and engorgement/breast care. LC encouraged MOB to call out for a latch assessment.  Maternal Data Has patient been taught Hand Expression?: No Does the patient have  breastfeeding experience prior to this delivery?: No  Feeding Mother's Current Feeding Choice: Breast Milk and Formula Nipple Type: Slow - flow  Lactation Tools Discussed/Used Tools: Pump;Flanges Flange Size: 18 Breast pump type: Double-Electric Breast Pump;Manual Pump Education: Setup, frequency, and cleaning;Milk Storage Reason for Pumping: LPI infant Pumping frequency: 15-20 min every 3 hrs  Interventions Interventions: Breast feeding basics reviewed;Hand pump;DEBP;Education;Pace feeding;LC Services brochure  Discharge Discharge Education: Engorgement and breast care;Warning signs for feeding baby Pump: Manual;Advised to call insurance company  Consult Status Consult Status: Follow-up Date: 04/04/24 Follow-up type: In-patient    Vernette Goo BS, IBCLC 04/03/2024, 4:31 PM

## 2024-04-03 NOTE — Progress Notes (Signed)
 Called to bedside due to Cat 2 tracing.  Initially noted to have late variables then a prolonged decels thought to be due to tachysystole.  Pt given Terbutaline, fluids and repositioned.  FSE was placed, SVE remained the same at ~ 4.5cm.  It was thought to have improved; however, the recurrent late decels continued with FHT 70bpm.  Pt was verbally consented for emergent C-section due to Cat 2 tracing and what appeared to be another episode of a prolonged deceleration.  The risks of surgery were discussed with the patient including but were not limited to: bleeding which may require transfusion or reoperation; infection which may require antibiotics; injury to bowel, bladder, ureters or other surrounding organs; injury to the fetus; need for additional procedures.  The patient concurred with the proposed plan, giving informed written consent for the procedure.    Anesthesia was already aware and at bedside.   Emergent broadcast for stat C-section called.  Gjon Letarte, DO Attending Obstetrician & Gynecologist, [facility] for [employer], [facility]

## 2024-04-04 DIAGNOSIS — Z98891 History of uterine scar from previous surgery: Secondary | ICD-10-CM

## 2024-04-04 DIAGNOSIS — D62 Acute posthemorrhagic anemia: Secondary | ICD-10-CM

## 2024-04-04 HISTORY — DX: Acute posthemorrhagic anemia: D62

## 2024-04-04 HISTORY — DX: History of uterine scar from previous surgery: Z98.891

## 2024-04-04 LAB — CBC
HCT: 25.7 % — ABNORMAL LOW (ref 36.0–46.0)
Hemoglobin: 8.5 g/dL — ABNORMAL LOW (ref 12.0–15.0)
MCH: 26.8 pg (ref 26.0–34.0)
MCHC: 33.1 g/dL (ref 30.0–36.0)
MCV: 81.1 fL (ref 80.0–100.0)
Platelets: 194 10*3/uL (ref 150–400)
RBC: 3.17 MIL/uL — ABNORMAL LOW (ref 3.87–5.11)
RDW: 13.6 % (ref 11.5–15.5)
WBC: 20.5 10*3/uL — ABNORMAL HIGH (ref 4.0–10.5)
nRBC: 0 % (ref 0.0–0.2)

## 2024-04-04 LAB — CBC WITH DIFFERENTIAL/PLATELET
Abs Immature Granulocytes: 0 10*3/uL (ref 0.00–0.07)
Basophils Absolute: 0 10*3/uL (ref 0.0–0.1)
Basophils Relative: 0 %
Eosinophils Absolute: 0 10*3/uL (ref 0.0–0.5)
Eosinophils Relative: 0 %
HCT: 25 % — ABNORMAL LOW (ref 36.0–46.0)
Hemoglobin: 8.4 g/dL — ABNORMAL LOW (ref 12.0–15.0)
Lymphocytes Relative: 9 %
Lymphs Abs: 2.3 10*3/uL (ref 0.7–4.0)
MCH: 27.1 pg (ref 26.0–34.0)
MCHC: 33.6 g/dL (ref 30.0–36.0)
MCV: 80.6 fL (ref 80.0–100.0)
Monocytes Absolute: 1 10*3/uL (ref 0.1–1.0)
Monocytes Relative: 4 %
Neutro Abs: 22 10*3/uL — ABNORMAL HIGH (ref 1.7–7.7)
Neutrophils Relative %: 87 %
Platelets: 187 10*3/uL (ref 150–400)
RBC: 3.1 MIL/uL — ABNORMAL LOW (ref 3.87–5.11)
RDW: 13.5 % (ref 11.5–15.5)
WBC: 25.3 10*3/uL — ABNORMAL HIGH (ref 4.0–10.5)
nRBC: 0 % (ref 0.0–0.2)
nRBC: 0 /100{WBCs}

## 2024-04-04 LAB — PREPARE RBC (CROSSMATCH)

## 2024-04-04 MED ORDER — HYDROMORPHONE HCL 2 MG PO TABS
2.0000 mg | ORAL_TABLET | ORAL | Status: DC | PRN
  Administered 2024-04-05 – 2024-04-06 (×3): 2 mg via ORAL
  Filled 2024-04-04 (×3): qty 1

## 2024-04-04 MED ORDER — SODIUM CHLORIDE 0.9% IV SOLUTION: Freq: Once | INTRAVENOUS | Status: AC

## 2024-04-04 MED ORDER — DIPHENHYDRAMINE HCL 25 MG PO CAPS
25.0000 mg | ORAL_CAPSULE | Freq: Once | ORAL | Status: AC
  Administered 2024-04-04: 25 mg via ORAL
  Filled 2024-04-04: qty 1

## 2024-04-04 MED ORDER — HYDROMORPHONE HCL 2 MG PO TABS
4.0000 mg | ORAL_TABLET | ORAL | Status: DC | PRN
  Administered 2024-04-04: 4 mg via ORAL
  Filled 2024-04-04: qty 2

## 2024-04-04 MED ORDER — ACETAMINOPHEN 500 MG PO TABS
1000.0000 mg | ORAL_TABLET | Freq: Once | ORAL | Status: AC
  Administered 2024-04-04: 1000 mg via ORAL
  Filled 2024-04-04: qty 2

## 2024-04-04 NOTE — Progress Notes (Signed)
 This nurse and nurse tech Roselyn Connor. Were able to get pt up and to bathroom. Pt wad able to use bathroom with 600 urine output.

## 2024-04-04 NOTE — Progress Notes (Signed)
 This nurse encouraged pt to get up to bathroom and walk around room/ switch positions more often. Pt was asleep when nurse entered room when awaken did not complain of any pain. When this nurse went to do fundal rub, this nurse just placed hand slightly to touch pt in abdominal area with no pressure being added, pt made painful sound. Tylenol  was given. This nurse also encouraged MOB to feed baby. MOB acknowledged and said okay. Formula and pump is set in room.

## 2024-04-04 NOTE — Progress Notes (Signed)
    Faculty Practice OB/GYN Attending Note  Subjective:  Came to talk to patient about management of her postoperative anemia; hemoglobin was 8.5 today from 11.1.  She reports feeling tired and lightheaded when she gets up, feels very weak. Also in significant pain around her incision and in her back.  Not much OOB today.    Objective:  Blood pressure (!) 104/52, pulse (!) 113, temperature 98.1 F (36.7 C), temperature source Oral, resp. rate 16, height 5\' 6"  (1.676 m), weight 66.7 kg, last menstrual period 07/24/2023, SpO2 98%, unknown if currently breastfeeding. Gen: NAD HENT: Normocephalic, atraumatic Lungs: Normal respiratory effort Heart: Elevated rate noted Abdomen: Dressing in place, C/D/I Cervix: Deferred Ext: 2+ DTRs, no edema, no cyanosis, negative Homan's sign     Latest Ref Rng & Units 04/04/2024    7:15 AM 04/03/2024   11:16 PM 04/02/2024    4:50 AM  CBC  WBC 4.0 - 10.5 K/uL 20.5  25.3  19.5   Hemoglobin 12.0 - 15.0 g/dL 8.5  8.4  29.5   Hematocrit 36.0 - 46.0 % 25.7  25.0  33.5   Platelets 150 - 400 K/uL 194  187  197     Assessment & Plan:  23 y.o. G1P0101 POD#1 s/p PLTCS now with symptomatic postoperative anemia and significant pain. - Patient was counseled about her symptomatic, clinically significant acute blood loss anemia after surgery.  Discussed Venofer vs blood transfusion, risks and benefits discussed in detail.  She desires blood transfusion, risks re-reviewed.  Pre-transfusion medications ordered.  2 pRBCs ordered, will check CBC tomorrow morning. - Pain medication changed from Oxycodone  to Dilaudid  as needed, continue Ibuprofen  and Neurontin . Continue close observation.   Lenoard Rad, MD, FACOG Obstetrician & Gynecologist, [facility] for [employer], [facility]

## 2024-04-04 NOTE — Progress Notes (Signed)
 POSTPARTUM PROGRESS NOTE  Post Partum Day 1  Subjective:  Bethany Harris is a 23 y.o. G1P0101 s/p LTCS at [redacted]w[redacted]d.  No acute events overnight.  Pt has had problems with ambulating, but is improving, voiding or po intake.  She denies nausea or vomiting.  Pain is moderately controlled.  She has had flatus. She has not had bowel movement.  Lochia Small.   Objective: Blood pressure (!) 95/47, pulse (!) 110, temperature 98 F (36.7 C), temperature source Oral, resp. rate 16, height 5\' 6"  (1.676 m), weight 66.7 kg, last menstrual period 07/24/2023, SpO2 99%, unknown if currently breastfeeding.  Physical Exam:  General: alert, cooperative and no distress Chest: no respiratory distress Heart:regular rate, distal pulses intact Abdomen: soft, nontender,  Uterine Fundus: firm, appropriately tender DVT Evaluation: No calf swelling or tenderness Extremities: trace edema Skin: warm, dry; incision clean/dry/intact  Recent Labs    04/03/24 2316 04/04/24 0715  HGB 8.4* 8.5*  HCT 25.0* 25.7*    Assessment/Plan: Bethany Harris is a 23 y.o. G1P0101 s/p LTCS at [redacted]w[redacted]d   PPD#1 - Doing well Contraception: nexplanon before DC Feeding: breast/bottle Dispo: Plan for discharge 04/05/24.   LOS: 2 days   Salomon Cree, CNM, CNM 04/04/2024, 8:54 AM

## 2024-04-04 NOTE — Lactation Note (Signed)
 This note was copied from a baby's chart. Lactation Consultation Note  Patient Name: Bethany Harris [id number] Date: 04/04/2024 Age:23 hours Reason for consult: Follow-up assessment;Primapara;1st time breastfeeding;Late-preterm 34-36.6wks;Infant weight loss;Breastfeeding assistance (7 % weight loss,) As LC entered the room the NT had assisted to latch the baby on the right breast with depth and multiple swallows noted, increased with breast compressions and baby released at 12 mins , nipple well rounded . LC reviewed pace feeding and baby took 15 ml with the standard white nipple.  LC reviewed the [facility] plan.  Feed with cues and by 3 hours, offer the breast 1st - feed for 15 -20 mins and then supplement according to the LPT guidelines.  Post pump both breast for 15 mins and save the milk for the next feeding.   Maternal Data    Feeding Mother's Current Feeding Choice: Breast Milk and Formula Nipple Type: Slow - flow  LATCH Score Latch:  (baby recently latched with depth)  Audible Swallowing:  (swallows noted)  Type of Nipple:  (nipple well rounded when the baby released)  Comfort (Breast/Nipple):  (per mom comfortable)  Hold (Positioning):  (assisted by the Nurse tech)      Lactation Tools Discussed/Used Tools: Pump Flange Size: 18 Breast pump type: Double-Electric Breast Pump Pump Education: Setup, frequency, and cleaning;Milk Storage Reason for Pumping: LPT Pumped volume: 25 mL  Interventions Interventions: Breast feeding basics reviewed;Breast compression;Hand pump;DEBP;Education;LC Services brochure;CDC milk storage guidelines;CDC Guidelines for Breast Pump Cleaning  Discharge - LC wil have to see if mom's medicaid is accepted by [employer] pump program.  Pump: Manual;Referral sent for [employer] Program: Yes  Consult Status Consult Status: Follow-up Date: 04/04/24 Follow-up type: In-patient    Renda Carpen Cythia Bachtel 04/04/2024, 12:39 PM

## 2024-04-04 NOTE — Progress Notes (Signed)
 This nurse encouraging MOB to get up and go to bathroom. Baby has still not been fed, this nurse will try to feed baby and get MOB up out of bed and continue encouraging ambulation and feeding baby. MOB was asleep when this nurse entered room with no complaints of pain at this time.

## 2024-04-04 NOTE — Progress Notes (Signed)
 NT entered room to get baby temp. MOB calmly resting in bed and talking on the phone. Pt reported 10/10 constant pain. RN made aware.

## 2024-04-04 NOTE — Progress Notes (Signed)
 Pt refusing to ambulate at this time. Will attempt at later time.

## 2024-04-05 LAB — CBC
HCT: 32.3 % — ABNORMAL LOW (ref 36.0–46.0)
Hemoglobin: 10.8 g/dL — ABNORMAL LOW (ref 12.0–15.0)
MCH: 27.6 pg (ref 26.0–34.0)
MCHC: 33.4 g/dL (ref 30.0–36.0)
MCV: 82.6 fL (ref 80.0–100.0)
Platelets: 199 10*3/uL (ref 150–400)
RBC: 3.91 MIL/uL (ref 3.87–5.11)
RDW: 14.1 % (ref 11.5–15.5)
WBC: 15.8 10*3/uL — ABNORMAL HIGH (ref 4.0–10.5)
nRBC: 0 % (ref 0.0–0.2)

## 2024-04-05 NOTE — Progress Notes (Signed)
    Faculty Practice OB/GYN Attending Note POD#2   Subjective:  Patient is currently sleeping soundly with heating pad. Had to wake up to discuss care Reports normal menstrual like bleeding Pain is poorly controlled, patient requesting pain medication  Reports passing gas, tolerating PO     Objective:  Blood pressure 103/74, pulse 95, temperature 98.4 F (36.9 C), temperature source Oral, resp. rate 16, height 5\' 6"  (1.676 m), weight 66.7 kg, last menstrual period 07/24/2023, SpO2 99%, unknown if currently breastfeeding. Gen: NAD HENT: Normocephalic, atraumatic Lungs: Normal respiratory effort Heart: Elevated rate noted Abdomen: Dressing in place, C/D/I Ext: 2+ DTRs, no edema, no cyanosis, negative Homan's sign     Latest Ref Rng & Units 04/05/2024    7:00 AM 04/04/2024    7:15 AM 04/03/2024   11:16 PM  CBC  WBC 4.0 - 10.5 K/uL 15.8  20.5  25.3   Hemoglobin 12.0 - 15.0 g/dL 29.5  8.5  8.4   Hematocrit 36.0 - 46.0 % 32.3  25.7  25.0   Platelets 150 - 400 K/uL 199  194  187     Assessment & Plan:  23 y.o. G1P0101  POD#2 s/p PLTCS now with symptomatic postoperative anemia and significant pain.  #Anemia: s/p 2uRBC with appropriate rise in HGB.  #Pain medication changed from Oxycodone  to Dilaudid  as needed, continue Ibuprofen  and Neurontin . Continue close observation.  Discharge home tomorrow Breastfeeding support PRN  Abner Ables, MD, MPH, ABFM, [facility] Attending Physician Center for [facility]

## 2024-04-05 NOTE — Progress Notes (Signed)
 Pt advised multiple times to ambulate frequently. Pt lying flat in bed every time RN entered room. Pt verbalized " I just came back from rest room."  Pt advised that she needs to walk further more often to help with recovery and pain management. Pt and support person verbalized understanding.

## 2024-04-05 NOTE — Lactation Note (Signed)
 This note was copied from a baby's chart. Lactation Consultation Note  Patient Name: Bethany Harris Date: 04/05/2024 Age:23 hours   P1- LC attempted to consult with MOB, but she was asleep. LC will attempt to consult with MOB at another time tonight.  Vernette Goo BS, IBCLC 04/05/2024, 5:00 PM

## 2024-04-05 NOTE — Lactation Note (Signed)
 This note was copied from a baby's chart. Lactation Consultation Note  Patient Name: Bethany Harris [name] Date: 04/05/2024 Age:23 hours Reason for consult: Follow-up assessment;Primapara;1st time breastfeeding;Late-preterm 34-36.6wks;Infant weight loss  P1- Infant was born at [redacted]w[redacted]d weighing [phone] and has had a total weight loss of 9.48%. MOB reports that as of today, infant has been more alert and is nursing on the breast frequently. MOB denies pumping besides on day 1 when this LC set up the DEBP. MOB states that her milk is there, but she wants the baby to latch and get the milk vs pumping. MOB also reports that she has been offering formula after every feeding as LC instructed her to. LC encouraged MOB to continue following the LPI/low birth weight plan that we set in place on day 1: entire feeding may not latch longer than 30 minutes total, offer formula as supplementation after every feeding, then pump for 15 minutes after every feeding for proper breast stimulation. LC reviewed how following these guidelines may help infant not lose more weight tomorrow. MOB verbalized understanding. LC encouraged MOB to call for further assistance as needed. MOB reports that she needs pain medication for her abdomen. LC informed her RN.  Maternal Data Does the patient have breastfeeding experience prior to this delivery?: No  Feeding Mother's Current Feeding Choice: Breast Milk and Formula Nipple Type: Nfant Standard Flow (white)  Lactation Tools Discussed/Used Tools: Pump;Flanges Flange Size: 18 Breast pump type: Double-Electric Breast Pump;Manual Pump Education: Setup, frequency, and cleaning;Milk Storage Reason for Pumping: LPI/low birth weight infant Pumping frequency: 15-20 min every 3 hrs  Interventions Interventions: Breast feeding basics reviewed;Education;Pace feeding;LC Services brochure;LPT handout/interventions  Discharge Discharge Education: Engorgement and breast care;Warning  signs for feeding baby Pump: Manual;Advised to call insurance company  Consult Status Consult Status: Follow-up Date: 04/06/24 Follow-up type: In-patient    Vernette Goo BS, IBCLC 04/05/2024, 9:05 PM

## 2024-04-06 LAB — TYPE AND SCREEN
ABO/RH(D): O POS
Antibody Screen: NEGATIVE
Unit division: 0
Unit division: 0

## 2024-04-06 LAB — BPAM RBC
Blood Product Expiration Date: 202506082359
Blood Product Expiration Date: 202506082359
ISSUE DATE / TIME: 202505092145
ISSUE DATE / TIME: 202505100019
Unit Type and Rh: 202506082359
Unit Type and Rh: 5100
Unit Type and Rh: 5100

## 2024-04-06 MED ORDER — WITCH HAZEL-GLYCERIN EX PADS: 1.0000 | MEDICATED_PAD | CUTANEOUS | 12 refills | Status: DC | PRN

## 2024-04-06 MED ORDER — COCONUT OIL OIL: 1.0000 | TOPICAL_OIL | Status: DC | PRN

## 2024-04-06 MED ORDER — DIBUCAINE (PERIANAL) 1 % EX OINT: 1.0000 | TOPICAL_OINTMENT | CUTANEOUS | Status: DC | PRN

## 2024-04-06 MED ORDER — OXYCODONE HCL 5 MG PO TABS: 5.0000 mg | ORAL_TABLET | ORAL | 0 refills | Status: DC | PRN

## 2024-04-06 MED ORDER — ASPIRIN 81 MG PO TBEC: 81.0000 mg | DELAYED_RELEASE_TABLET | Freq: Every day | ORAL | 0 refills | Status: DC

## 2024-04-06 MED ORDER — IBUPROFEN 600 MG PO TABS: 600.0000 mg | ORAL_TABLET | Freq: Four times a day (QID) | ORAL | 1 refills | Status: DC

## 2024-04-06 MED ORDER — GABAPENTIN 100 MG PO CAPS: 200.0000 mg | ORAL_CAPSULE | Freq: Three times a day (TID) | ORAL | 0 refills | Status: DC

## 2024-04-06 NOTE — Lactation Note (Addendum)
 This note was copied from a baby's chart. Lactation Consultation Note  Patient Name: Bethany Harris [name] Date: 04/06/2024 Age:23 hours Reason for consult: Follow-up assessment;1st time breastfeeding;Late-preterm 33-36.6wks  Mother declined [place] interpreter and spoke to [facility] in [place]. Room full of visitors.  P1, Weight increased.Baby [redacted]w[redacted]d PMA Mother is breastfeeding, supplementing with formula and has been advised to stimulate her supply by pumping q 3 hours for 15 min.   Mother has not been regularly pumping.  Baby being supplemented with 22 kcal Neosure with Nfant standard nipple volumes of 10-23 ml. Mother states baby recently breastfed for 15 min and is now sucking on a pacifier. Suggest waiting on pacifier use for 3 weeks to help establish mother's milk supply and baby does not miss feedings.  Discussed waking for feeds if needed q 3 hours. Reviewed engorgement care and monitoring voids/stools. Recommend calling her insurance company for [employer]. Maternal Data Does the patient have breastfeeding experience prior to this delivery?: No  Feeding Mother's Current Feeding Choice: Breast Milk and Formula  Lactation Tools Discussed/Used Tools: Pump Pumping frequency: q 3 hours for 15 min  Interventions Interventions: Education  Discharge Discharge Education: Engorgement and breast care;Warning signs for feeding baby Pump: Manual;Advised to call insurance company  Consult Status Consult Status: Complete Date: 04/06/24    Bethany Harris [name] 04/06/2024, 10:21 AM

## 2024-04-07 ENCOUNTER — Inpatient Hospital Stay: Payer: Self-pay | Admitting: Nurse Practitioner

## 2024-04-07 NOTE — Anesthesia Postprocedure Evaluation (Signed)
 Anesthesia Post Note  Patient: Bethany Harris  Procedure(s) Performed: CESAREAN DELIVERY     Patient location during evaluation: PACU Anesthesia Type: Epidural Level of consciousness: awake and alert and oriented Pain management: pain level controlled Vital Signs Assessment: post-procedure vital signs reviewed and stable Respiratory status: spontaneous breathing, nonlabored ventilation and respiratory function stable Cardiovascular status: blood pressure returned to baseline and stable Postop Assessment: no headache, no backache, patient able to bend at knees and epidural receding Anesthetic complications: no   No notable events documented.  Last Vitals:  Vitals:   04/05/24 2300 04/06/24 0551  BP: 114/76 92/62  Pulse: (!) 103 90  Resp: 18 18  Temp: 36.8 C 36.8 C  SpO2: 100% 99%    Last Pain:  Vitals:   04/06/24 1241  TempSrc:   PainSc: 4                  Jacquelyne Matte

## 2024-04-09 ENCOUNTER — Encounter: Admitting: Family Medicine

## 2024-04-11 ENCOUNTER — Other Ambulatory Visit: Payer: Self-pay

## 2024-04-11 ENCOUNTER — Ambulatory Visit

## 2024-04-11 ENCOUNTER — Emergency Department (HOSPITAL_COMMUNITY)
Admission: EM | Admit: 2024-04-11 | Discharge: 2024-04-11 | Disposition: A | Discharge status: Home / Self Care | Attending: Emergency Medicine | Admitting: Emergency Medicine

## 2024-04-11 ENCOUNTER — Emergency Department (HOSPITAL_COMMUNITY)

## 2024-04-11 VITALS — BP 98/63 | HR 143 | Ht 66.0 in | Wt 135.0 lb

## 2024-04-11 DIAGNOSIS — Z7982 Long term (current) use of aspirin: Secondary | ICD-10-CM | POA: Insufficient documentation

## 2024-04-11 DIAGNOSIS — R0602 Shortness of breath: Secondary | ICD-10-CM | POA: Diagnosis not present

## 2024-04-11 DIAGNOSIS — R002 Palpitations: Secondary | ICD-10-CM | POA: Diagnosis present

## 2024-04-11 DIAGNOSIS — Z4889 Encounter for other specified surgical aftercare: Secondary | ICD-10-CM

## 2024-04-11 DIAGNOSIS — R000 Tachycardia, unspecified: Secondary | ICD-10-CM | POA: Insufficient documentation

## 2024-04-11 LAB — CBC WITH DIFFERENTIAL/PLATELET
Abs Immature Granulocytes: 0.14 10*3/uL — ABNORMAL HIGH (ref 0.00–0.07)
Basophils Absolute: 0.1 10*3/uL (ref 0.0–0.1)
Basophils Relative: 1 %
Eosinophils Absolute: 0.2 10*3/uL (ref 0.0–0.5)
Eosinophils Relative: 2 %
HCT: 41.2 % (ref 36.0–46.0)
Hemoglobin: 13.5 g/dL (ref 12.0–15.0)
Immature Granulocytes: 2 %
Lymphocytes Relative: 20 %
Lymphs Abs: 1.7 10*3/uL (ref 0.7–4.0)
MCH: 26.9 pg (ref 26.0–34.0)
MCHC: 32.8 g/dL (ref 30.0–36.0)
MCV: 82.2 fL (ref 80.0–100.0)
Monocytes Absolute: 0.6 10*3/uL (ref 0.1–1.0)
Monocytes Relative: 7 %
Neutro Abs: 5.8 10*3/uL (ref 1.7–7.7)
Neutrophils Relative %: 68 %
Platelets: 340 10*3/uL (ref 150–400)
RBC: 5.01 MIL/uL (ref 3.87–5.11)
RDW: 15 % (ref 11.5–15.5)
WBC: 8.5 10*3/uL (ref 4.0–10.5)
nRBC: 0 % (ref 0.0–0.2)

## 2024-04-11 LAB — COMPREHENSIVE METABOLIC PANEL WITH GFR
ALT: 23 U/L (ref 0–44)
AST: 19 U/L (ref 15–41)
Albumin: 2.9 g/dL — ABNORMAL LOW (ref 3.5–5.0)
Alkaline Phosphatase: 135 U/L — ABNORMAL HIGH (ref 38–126)
Anion gap: 11 (ref 5–15)
BUN: 11 mg/dL (ref 6–20)
CO2: 18 mmol/L — ABNORMAL LOW (ref 22–32)
Calcium: 8.7 mg/dL — ABNORMAL LOW (ref 8.9–10.3)
Chloride: 109 mmol/L (ref 98–111)
Creatinine, Ser: 0.52 mg/dL (ref 0.44–1.00)
GFR, Estimated: >60 mL/min (ref >60)
Glucose, Bld: 91 mg/dL (ref 70–99)
Potassium: 3.8 mmol/L (ref 3.5–5.1)
Sodium: 138 mmol/L (ref 135–145)
Total Bilirubin: 0.6 mg/dL (ref 0.0–1.2)
Total Protein: 6.4 g/dL — ABNORMAL LOW (ref 6.5–8.1)

## 2024-04-11 LAB — TROPONIN I (HIGH SENSITIVITY): Troponin I (High Sensitivity): 3 ng/L (ref <18)

## 2024-04-11 MED ORDER — DIPHENHYDRAMINE HCL 50 MG/ML IJ SOLN
50.0000 mg | Freq: Once | INTRAMUSCULAR | Status: AC
  Administered 2024-04-11: 50 mg via INTRAVENOUS
  Filled 2024-04-11: qty 1

## 2024-04-11 MED ORDER — IOHEXOL 350 MG/ML SOLN
75.0000 mL | Freq: Once | INTRAVENOUS | Status: AC | PRN
  Administered 2024-04-11: 75 mL via INTRAVENOUS

## 2024-04-11 MED ORDER — OXYCODONE HCL 5 MG PO TABS
5.0000 mg | ORAL_TABLET | ORAL | Status: DC | PRN
  Administered 2024-04-11: 5 mg via ORAL
  Filled 2024-04-11: qty 1

## 2024-04-11 MED ORDER — METHYLPREDNISOLONE SODIUM SUCC 40 MG IJ SOLR
40.0000 mg | Freq: Once | INTRAMUSCULAR | Status: AC
  Administered 2024-04-11: 40 mg via INTRAVENOUS
  Filled 2024-04-11: qty 1

## 2024-04-11 MED ORDER — DIPHENHYDRAMINE HCL 25 MG PO CAPS: 50.0000 mg | ORAL_CAPSULE | Freq: Once | ORAL | Status: AC

## 2024-04-11 NOTE — ED Notes (Addendum)
 Pt left at 1810, without paperwork

## 2024-04-11 NOTE — Progress Notes (Signed)
 Incision Check Visit  Bethany Harris is here for incision check following primary c-section on 04/03/24.   Assessment: Honeycomb dressing was still intact. RN removed dressing. Incision is clean, dry, intact, well approximated with dermabond present. Reviewed s/s of infection with patient.   Education: Reviewed good daily wound care and s/s of infection with patient.  Today, HR sustaining in 135-140. Patient reports feeling "heart racing" no SOB. BP 98/63, HR 143, RR 18. Review chart with physician who advised patient to be further evaluated at MAU. Patient verbalized understanding and will go to MAU. MAU physician notified.    Patient will follow up post partum visit on 05/16/24 at 0835.  Lennart Quitter, RN 04/11/2024  9:21 AM

## 2024-04-11 NOTE — ED Triage Notes (Signed)
 Patient complains of intermittent palpitations x 1.5 months. Went to follow up appointment today s/p c section on 5/8 and was sent to ED for further eval. Complains of shortness of breath and generalized chest pain, also ongoing x 1.5 months.

## 2024-04-11 NOTE — Discharge Instructions (Addendum)
 Jenica Devino:  Thank you for allowing us  to take care of you today.  We hope you begin feeling better soon. You were seen today for tachycardia and palpations.  No evidence of pulmonary embolism on CT scan.  Therefore I feel that you can follow-up with your primary care doctor.  If you begin to have worsening symptoms or you notice that your legs begin to swell please return to emergency department immediately.  To-Do:  Please follow-up with your primary doctor within the next 2-3 days. It is important that you review any labs or imaging results (if any) that you had today with them. Your preliminary imaging results (if any) are attached. Please return to the Emergency Department or call 911 if you experience chest pain, shortness of breath, severe pain, severe fever, altered mental status, or have any reason to think that you need emergency medical care.  Thank you again.  Hope you feel better soon.  Arminda Landmark, MD Department of Emergency Medicine

## 2024-04-11 NOTE — ED Provider Notes (Signed)
 Assume Care - Medical Decision Making  Care of patient assumed from previous emergency medicine provider. See their note for further details of history, physical exam and plan.  Briefly, Bethany Harris is a 23 y.o. female who presents as below:  Clinical Course as of 04/11/24 2331  Fri Apr 11, 2024  1527 S; HR in 140s, no fevers; did have C section; post partum 1 wk; delivered at 35w; hx pelvic fracture prior to delivery; does not need to go to MAU if negative CTA; does have a contrast allergy [WC]  1724 I discussed with the patient regarding oxycodone  use.  She reports this is what she has been using for pain control after her C-section.  She elects to use this medication here in the emergency department. [WC]    Clinical Course User Index [WC] Arminda Landmark, MD     Reassessment:  I personally reassessed the patient: Vital Signs:  The most current vitals were:  Vitals:   04/11/24 1400 04/11/24 1415  BP: 112/77 111/75  Pulse: (!) 121 71  Resp: 19 (!) 21  Temp:    SpO2: 90% 99%     Hemodynamics:  The patient is hemodynamically stable. Mental Status:  The patient is alert   Additional MDM/ED Course: On repeat examination patient does not have creased work of breathing.  She has no peripheral edema.  No concerns for peripartum cardiomyopathy. CTA of the chest came back negative for PE.  I do feel the patient can be discharged at this time.  We discussed following up with her OB/GYN.  We discussed if she has worsening shortness of breath, palpitations or feels that her heart is racing she should come back to the emergency department.  She is agreeable to this plan.   Arminda Landmark, MD 04/11/24 2332    Dorenda Gandy, MD 04/12/24 2032
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
## 2024-04-15 ENCOUNTER — Encounter: Payer: Self-pay | Admitting: Nurse Practitioner

## 2024-04-15 ENCOUNTER — Telehealth: Payer: Self-pay

## 2024-04-15 ENCOUNTER — Ambulatory Visit (INDEPENDENT_AMBULATORY_CARE_PROVIDER_SITE_OTHER): Payer: Self-pay | Admitting: Nurse Practitioner

## 2024-04-15 VITALS — BP 126/69 | HR 97 | Temp 98.3°F | Wt 134.4 lb

## 2024-04-15 DIAGNOSIS — M25552 Pain in left hip: Secondary | ICD-10-CM | POA: Diagnosis not present
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
## 2024-04-15 NOTE — Patient Instructions (Signed)
 1. Left hip pain (Primary)  - AMB referral to orthopedics
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
## 2024-04-16 ENCOUNTER — Other Ambulatory Visit

## 2024-04-16 ENCOUNTER — Ambulatory Visit

## 2024-04-25 ENCOUNTER — Ambulatory Visit: Payer: Self-pay | Admitting: Cardiology

## 2024-05-16 ENCOUNTER — Ambulatory Visit: Payer: Self-pay | Admitting: Student

## 2024-05-16 ENCOUNTER — Encounter: Payer: Self-pay | Admitting: Student

## 2024-05-16 ENCOUNTER — Other Ambulatory Visit: Payer: Self-pay

## 2024-05-16 DIAGNOSIS — Z3202 Encounter for pregnancy test, result negative: Secondary | ICD-10-CM

## 2024-05-16 DIAGNOSIS — Z98891 History of uterine scar from previous surgery: Secondary | ICD-10-CM

## 2024-05-16 DIAGNOSIS — S329XXD Fracture of unspecified parts of lumbosacral spine and pelvis, subsequent encounter for fracture with routine healing: Secondary | ICD-10-CM

## 2024-05-16 DIAGNOSIS — Z1332 Encounter for screening for maternal depression: Secondary | ICD-10-CM

## 2024-05-16 LAB — POCT PREGNANCY, URINE: Preg Test, Ur: NEGATIVE

## 2024-05-16 MED ORDER — LEVONORGESTREL 20 MCG/DAY IU IUD: 1.0000 | INTRAUTERINE_SYSTEM | Freq: Once | INTRAUTERINE | Status: DC
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
## 2024-05-19 ENCOUNTER — Ambulatory Visit: Admitting: Physical Therapy

## 2024-05-20 ENCOUNTER — Ambulatory Visit: Payer: Self-pay | Admitting: Student
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
## 2024-05-23 ENCOUNTER — Ambulatory Visit: Admitting: Physical Therapy

## 2024-06-02 ENCOUNTER — Other Ambulatory Visit: Payer: Self-pay | Admitting: Nurse Practitioner

## 2024-06-02 DIAGNOSIS — N921 Excessive and frequent menstruation with irregular cycle: Secondary | ICD-10-CM
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
## 2024-06-03 ENCOUNTER — Ambulatory Visit: Attending: Student

## 2024-06-03 ENCOUNTER — Other Ambulatory Visit: Payer: Self-pay

## 2024-06-03 DIAGNOSIS — M6281 Muscle weakness (generalized): Secondary | ICD-10-CM | POA: Diagnosis present

## 2024-06-03 DIAGNOSIS — R2689 Other abnormalities of gait and mobility: Secondary | ICD-10-CM | POA: Diagnosis present

## 2024-06-03 DIAGNOSIS — R102 Pelvic and perineal pain: Secondary | ICD-10-CM | POA: Insufficient documentation

## 2024-06-04 ENCOUNTER — Encounter (HOSPITAL_BASED_OUTPATIENT_CLINIC_OR_DEPARTMENT_OTHER): Payer: Self-pay | Admitting: Certified Nurse Midwife

## 2024-06-04 ENCOUNTER — Ambulatory Visit (INDEPENDENT_AMBULATORY_CARE_PROVIDER_SITE_OTHER): Admitting: Certified Nurse Midwife

## 2024-06-04 ENCOUNTER — Ambulatory Visit (HOSPITAL_BASED_OUTPATIENT_CLINIC_OR_DEPARTMENT_OTHER)
Admission: RE | Admit: 2024-06-04 | Discharge: 2024-06-04 | Disposition: A | Discharge status: Home / Self Care | Source: Ambulatory Visit | Attending: Certified Nurse Midwife | Admitting: Certified Nurse Midwife

## 2024-06-04 VITALS — BP 109/73 | HR 85 | Ht 64.0 in | Wt 137.8 lb

## 2024-06-04 DIAGNOSIS — N926 Irregular menstruation, unspecified: Secondary | ICD-10-CM | POA: Diagnosis present

## 2024-06-04 DIAGNOSIS — R42 Dizziness and giddiness: Secondary | ICD-10-CM

## 2024-06-04 DIAGNOSIS — D509 Iron deficiency anemia, unspecified: Secondary | ICD-10-CM

## 2024-06-04 MED ORDER — ACCRUFER 30 MG PO CAPS
1.0000 | ORAL_CAPSULE | ORAL | 4 refills | Status: DC
Start: 2024-06-04 — End: 2024-09-09

## 2024-06-04 NOTE — Progress Notes (Signed)
  Subjective:     Bethany Harris is a 23 y.o. female G1P0101 (Primary LTCS 04/03/24), now 2 months postpartum. Pt breastfeeding. Reports some irregular spotting and bleeding. Pt started bleeding approx 4 days but states it does not feel like a typical period. Had unprotected sex with her spouse 4 days ago (Sunday). Pt states she is planning Copper IUD at end of July. She is educated that she is to remain abstinent until insertion of Paragard IUD. There is no risk of STI.   The following portions of the patient's history were reviewed and updated as appropriate: allergies, current medications, past family history, past medical history, past social history, past surgical history, and problem list.   Review of Systems Pertinent items are noted in HPI.    Objective:    BP 109/73   Pulse 85   Ht 5' 4 (1.626 m) Comment: Reported  Wt 137 lb 12.8 oz (62.5 kg)   Breastfeeding Yes   BMI 23.65 kg/m  General appearance: alert, cooperative, appears stated age, and pale Neurologic: Alert and oriented X 3, normal strength and tone. Normal symmetric reflexes. Normal coordination and gait    Assessment:    G1P0101 Primary LTCS now at 2 months postpartum.   Breastfeeding/Irregular menstrual-like bleeding  Plan:    Labs pending (rule out anemia/suspect anemia).  Rx sent for Accrufer  one tablet every other day. Continue prenatal vitamin daily Pt has appointment in 2 weeks for postpartum/IUD insertion. Routine urine culture sent. Will plan US  to rule out any retained products of conception (not likely). Order sent.  Arland MARLA Roller

## 2024-06-05 ENCOUNTER — Ambulatory Visit (HOSPITAL_BASED_OUTPATIENT_CLINIC_OR_DEPARTMENT_OTHER): Payer: Self-pay | Admitting: Certified Nurse Midwife

## 2024-06-05 LAB — IRON,TIBC AND FERRITIN PANEL
Ferritin: 80 ng/mL (ref 15–150)
Iron Saturation: 25 % (ref 15–55)
Iron: 81 ug/dL (ref 27–159)
Total Iron Binding Capacity: 321 ug/dL (ref 250–450)
UIBC: 240 ug/dL (ref 131–425)

## 2024-06-05 LAB — BETA HCG QUANT (REF LAB): hCG Quant: <1 m[IU]/mL

## 2024-06-05 LAB — CBC
Hematocrit: 41.3 % (ref 34.0–46.6)
Hemoglobin: 13.2 g/dL (ref 11.1–15.9)
MCH: 26.6 pg (ref 26.6–33.0)
MCHC: 32 g/dL (ref 31.5–35.7)
MCV: 83 fL (ref 79–97)
Platelets: 242 x10E3/uL (ref 150–450)
RBC: 4.97 x10E6/uL (ref 3.77–5.28)
RDW: 16.1 % — ABNORMAL HIGH (ref 11.7–15.4)
WBC: 6.6 x10E3/uL (ref 3.4–10.8)

## 2024-06-05 LAB — URINE CULTURE

## 2024-06-11 ENCOUNTER — Ambulatory Visit

## 2024-06-11 DIAGNOSIS — R2689 Other abnormalities of gait and mobility: Secondary | ICD-10-CM

## 2024-06-11 DIAGNOSIS — M6281 Muscle weakness (generalized): Secondary | ICD-10-CM

## 2024-06-11 DIAGNOSIS — R102 Pelvic and perineal pain: Secondary | ICD-10-CM
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
## 2024-06-17 ENCOUNTER — Encounter (HOSPITAL_COMMUNITY): Payer: Self-pay | Admitting: Emergency Medicine

## 2024-06-17 ENCOUNTER — Other Ambulatory Visit: Payer: Self-pay

## 2024-06-17 ENCOUNTER — Emergency Department (HOSPITAL_COMMUNITY)

## 2024-06-17 ENCOUNTER — Telehealth: Payer: Self-pay

## 2024-06-17 ENCOUNTER — Emergency Department (HOSPITAL_COMMUNITY)
Admission: EM | Admit: 2024-06-17 | Discharge: 2024-06-17 | Disposition: A | Discharge status: Home / Self Care | Attending: Emergency Medicine | Admitting: Emergency Medicine

## 2024-06-17 ENCOUNTER — Emergency Department (HOSPITAL_COMMUNITY)
Admission: EM | Admit: 2024-06-17 | Discharge: 2024-06-17 | Discharge status: Against Medical Advice | Attending: Emergency Medicine | Admitting: Emergency Medicine

## 2024-06-17 DIAGNOSIS — Z5321 Procedure and treatment not carried out due to patient leaving prior to being seen by health care provider: Secondary | ICD-10-CM | POA: Insufficient documentation

## 2024-06-17 DIAGNOSIS — W260XXA Contact with knife, initial encounter: Secondary | ICD-10-CM | POA: Insufficient documentation

## 2024-06-17 DIAGNOSIS — S91311A Laceration without foreign body, right foot, initial encounter: Secondary | ICD-10-CM | POA: Diagnosis present

## 2024-06-17 DIAGNOSIS — S99921A Unspecified injury of right foot, initial encounter: Secondary | ICD-10-CM | POA: Diagnosis present

## 2024-06-17 MED ORDER — ACETAMINOPHEN 500 MG PO TABS
1000.0000 mg | ORAL_TABLET | Freq: Once | ORAL | Status: AC
  Administered 2024-06-17: 1000 mg via ORAL
  Filled 2024-06-17: qty 2

## 2024-06-17 MED ORDER — CEPHALEXIN 500 MG PO CAPS: 500.0000 mg | ORAL_CAPSULE | Freq: Four times a day (QID) | ORAL | 0 refills | Status: DC

## 2024-06-17 MED ORDER — LIDOCAINE-EPINEPHRINE-TETRACAINE (LET) TOPICAL GEL
3.0000 mL | Freq: Once | TOPICAL | Status: AC
  Administered 2024-06-17: 3 mL via TOPICAL
  Filled 2024-06-17: qty 3

## 2024-06-17 MED ORDER — AZITHROMYCIN 500 MG PO TABS: 500.0000 mg | ORAL_TABLET | Freq: Every day | ORAL | 0 refills | Status: DC

## 2024-06-17 NOTE — ED Triage Notes (Signed)
 Pt was sitting down and cutting some meat and knife slipped. 1 inch laceration to R foot. Bleeding controlled. States got vaccines for green card 8 mos ago but unsure if tetanus.
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
## 2024-06-17 NOTE — ED Notes (Signed)
 PT left before receiving discharge paper work.

## 2024-06-17 NOTE — Discharge Instructions (Signed)
 You were seen in the Emergency Department today for evaluation of your laceration. I am glad we were able to repair this for you. Please make sure that you are remembering to keep your wound clean daily with Dial soap and water and daily bandage changes. I recommend keeping the wound covered for the next 48-72 hours and then to your comfort afterwards. Do not expose the wound to any dishwater, pools, lakes, oceans, [employer], dirt or grime. Keeping the wound clean and away from contamination can help ensure good wound healing and help to prevent infections. You will need to return in 7 days for suture removal. This can be down at your primary care office, urgent care, or the ER.  For pain, I recommend Tylenol  1000mg  and/or ibuprofen  600mg  every 6 hours as needed for pain. Do not take ibuprofen  if you are concerned for pregnancy. Please make sure to take your antibiotic as prescribed and complete the entirety of the course. If you have any concerns, new or worsening symptoms, please return to the nearest ER for re-evaluation.   Contact a doctor if: You got a tetanus shot and you have any of these problems where the needle went in: Swelling. Very bad pain. Redness. Bleeding. A wound that was closed breaks open. You have a fever. You have any of these signs of infection in your wound: More redness, swelling, or pain. Fluid or blood. Warmth. Pus or a bad smell. You see something coming out of the wound, such as wood or glass. Medicine does not make your pain go away. You notice a change in the color of your skin near your wound. You need to change the bandage often. You have a new rash. You lose feeling (have numbness) around the wound. Get help right away if: You have very bad swelling around the wound. Your pain suddenly gets worse and is very bad. You have painful lumps near the wound or on skin anywhere on your body. You have a red streak going away from your wound. The wound is on your hand  or foot, and: You cannot move a finger or toe. Your fingers or toes look pale or bluish.

## 2024-06-17 NOTE — ED Notes (Signed)
 Pt leaving d/t wait time stating she has a 81 month old daughter at home.

## 2024-06-17 NOTE — ED Provider Notes (Signed)
 [place] EMERGENCY DEPARTMENT AT [facility] Provider Note   CSN: [id number] Arrival date & time: 06/17/24  0124     Patient presents with: Laceration   Bethany Harris is a 23 y.o. female presents to the ER for evaluation of rigth foot laceration sustained 06/16/24 around 1700. Reports that she was cutting raw chicken and was sitting on the floor when the knife slipped. Reports tetanus is up to date. NKDA. Is breastfeeding.    Laceration      Prior to Admission medications   Medication Sig Start Date End Date Taking? Authorizing Provider  acetaminophen  (TYLENOL ) 500 MG tablet Take 2 tablets (1,000 mg total) by mouth every 6 (six) hours. 02/16/24   Cleatus Moccasin, MD  aspirin  EC 81 MG tablet Take 1 tablet (81 mg total) by mouth daily. Take for 6 weeks after delivery Patient not taking: Reported on 05/16/2024 04/06/24   Eldonna Suzen Octave, MD  coconut oil OIL Apply 1 Application topically as needed. 04/06/24   Eldonna Suzen Octave, MD  cyclobenzaprine  (FLEXERIL ) 10 MG tablet Take 1 tablet (10 mg total) by mouth 3 (three) times daily. Patient not taking: Reported on 05/16/2024 03/31/24   Cresenzo, John V, MD  dibucaine (NUPERCAINAL) 1 % OINT Place 1 Application rectally as needed for hemorrhoids. Patient not taking: Reported on 05/16/2024 04/06/24   Eldonna Suzen Octave, MD  doxylamine , Sleep, (UNISOM ) 25 MG tablet Take 1 tablet (25 mg total) by mouth at bedtime as needed for sleep. Patient not taking: Reported on 05/16/2024 02/05/24   Eveline Lynwood MATSU, MD  famotidine  (PEPCID ) 20 MG tablet Take 1 tablet (20 mg total) by mouth 2 (two) times daily. Patient not taking: Reported on 05/16/2024 03/24/24   Davis, Devon E, PA-C  Ferric Maltol  (ACCRUFER ) 30 MG CAPS Take 1 capsule (30 mg total) by mouth every other day. 06/04/24   Lo, Arland POUR, CNM  gabapentin  (NEURONTIN ) 100 MG capsule Take 2 capsules (200 mg total) by mouth 3 (three) times daily for 10 days. Patient not taking: Reported on  05/16/2024 04/06/24 04/16/24  Eldonna Suzen Octave, MD  ibuprofen  (ADVIL ) 600 MG tablet Take 1 tablet (600 mg total) by mouth every 6 (six) hours. 04/06/24   Eldonna Suzen Octave, MD  meclizine  (ANTIVERT ) 12.5 MG tablet Take 1 tablet (12.5 mg total) by mouth 3 (three) times daily as needed for dizziness. Use for nausea and vomiting. Patient not taking: Reported on 05/16/2024 03/24/24   Davis, Devon E, PA-C  ondansetron  (ZOFRAN -ODT) 4 MG disintegrating tablet Take 4 mg by mouth every 8 (eight) hours as needed. Patient not taking: Reported on 05/16/2024 12/24/23   [provider]  oxyCODONE  (OXY IR/ROXICODONE ) 5 MG immediate release tablet Take 1 tablet (5 mg total) by mouth every 4 (four) hours as needed for severe pain (pain score 7-10). Patient not taking: Reported on 05/16/2024 04/06/24   Eldonna Suzen Octave, MD  Prenatal Vit-Fe Fumarate-FA (PREPLUS) 27-1 MG TABS Take 1 tablet by mouth daily. Patient not taking: Reported on 05/16/2024 03/05/24   Zina Jerilynn LABOR, MD  SENNA-TIME 8.6 MG tablet Take 2 tablets by mouth at bedtime as needed. Patient not taking: Reported on 05/16/2024 11/10/23   [provider]  witch hazel-glycerin  (TUCKS) pad Apply 1 Application topically as needed for hemorrhoids. Patient not taking: Reported on 05/16/2024 04/06/24   Eldonna Suzen Octave, MD    Allergies: Iohexol     Review of Systems  Skin:  Positive for wound.  Neurological:  Negative for numbness.  Updated Vital Signs BP 99/72   Pulse 97   Temp 97.7 F (36.5 C)   Resp 18   Ht 5' 4 (1.626 m)   Wt 62.5 kg   LMP 06/10/2024   SpO2 97%   BMI 23.65 kg/m   Physical Exam Vitals and nursing note reviewed.  Constitutional:      General: She is not in acute distress.    Appearance: She is not ill-appearing or toxic-appearing.  Eyes:     General: No scleral icterus. Pulmonary:     Effort: Pulmonary effort is normal. No respiratory distress.  Musculoskeletal:        General: Tenderness  and signs of injury present.     Right lower leg: No edema.     Comments: Please see attached image.  Image was after let was placed with the right ear some surrounding blanching.  Hemostatic.  Appears to be around 1 cm.  She is able to wiggle her toes however does have some pain.  Palpable pulses.  Compartments are soft.  No red streaking.  Brisk cap refill to the toes. Does have pain with moving the foot/ankle.   Skin:    General: Skin is warm and dry.  Neurological:     Mental Status: She is alert.     Sensory: No sensory deficit.       (all labs ordered are listed, but only abnormal results are displayed) Labs Reviewed - No data to display  EKG: None  Radiology: DG Foot Complete Right Result Date: 06/17/2024 CLINICAL DATA:  Medial left foot laceration. EXAM: RIGHT FOOT COMPLETE - 3+ VIEW COMPARISON:  None Available. FINDINGS: There is no evidence of fracture or dislocation. There is no evidence of arthropathy or other focal bone abnormality. Soft tissues are unremarkable. IMPRESSION: Negative. Electronically Signed   By: Suzen Dials M.D.   On: 06/17/2024 01:54  .Laceration Repair  Date/Time: 06/17/2024 4:23 AM  Performed by: Bernis Ernst, PA-C Authorized by: Bernis Ernst, PA-C   Consent:    Consent obtained:  Verbal   Consent given by:  Patient   Risks, benefits, and alternatives were discussed: yes     Risks discussed:  Infection, pain, tendon damage and nerve damage   Alternatives discussed:  No treatment Universal protocol:    Procedure explained and questions answered to patient or proxy's satisfaction: yes     Imaging studies available: yes     Patient identity confirmed:  Verbally with patient Anesthesia:    Anesthesia method:  Topical application   Topical anesthetic:  LET Laceration details:    Location:  Foot   Foot location: Right medial aspect of foot.   Length (cm):  1 Pre-procedure details:    Preparation:  Patient was prepped and draped in usual  sterile fashion and imaging obtained to evaluate for foreign bodies Exploration:    Limited defect created (wound extended): no     Hemostasis achieved with:  LET   Imaging obtained: x-ray     Imaging outcome: foreign body not noted     Wound exploration: wound explored through full range of motion and entire depth of wound visualized     Wound extent: no underlying fracture     Contaminated: no   Treatment:    Area cleansed with:  Saline and Shur-Clens   Amount of cleaning:  Extensive   Irrigation solution:  Sterile saline   Irrigation volume:    Irrigation method:  Pressure wash and syringe Skin repair:  Repair method:  Sutures   Suture size:  4-0   Suture material:  Nylon   Suture technique:  Simple interrupted   Number of sutures:  1 Approximation:    Approximation:  Close Repair type:    Repair type:  Simple Post-procedure details:    Dressing:  Antibiotic ointment and bulky dressing   Procedure completion:  Tolerated well, no immediate complications    Medications Ordered in the ED  acetaminophen  (TYLENOL ) tablet 1,000 mg (1,000 mg Oral Given 06/17/24 0219)  lidocaine -EPINEPHrine -tetracaine  (LET) topical gel (3 mLs Topical Given 06/17/24 0219)   Medical Decision Making Amount and/or Complexity of Data Reviewed Radiology: ordered.  Risk OTC drugs. Prescription drug management.   23 y.o. female presents to the ER for evaluation of foot laceration. Differential diagnosis includes but is not limited to laceration, retained foreign body. Vital signs unremarkable. Physical exam as noted above.   XR foot negative. Per radiologist's interpretation.    Small laceration however does appear to be deeper than 2 mm.  Given the location do not think this would be a good candidate for glue or Steri-Strips.  Will place 1 suture.  Risk and benefits discussion with patient.  Let applied.  1 suture placed.  Xeroform with bulky dressing with Ace bandage.  Patient having pain  with walking given location, crutches ordered.  Given that this happened around 1700 and was raw chicken as well as patient's breast-feeding, have consulted pharmacy for antibiotic recommendations.  Spoke with Annabella Leos RPH who recommended Azithromycin  and Keflex . Tetanus does NOT need update. She is NV intact distally. Will give her podiatry follow up. We discussed wound care and return precautions.   We discussed the results of the labs/imaging. The plan is take antibiotics, daily wound care, supportive care. We discussed strict return precautions and red flag symptoms. The patient verbalized their understanding and agrees to the plan. The patient is stable and being discharged home in good condition.  Portions of this report may have been transcribed using voice recognition software. Every effort was made to ensure accuracy; however, inadvertent computerized transcription errors may be present.    Final diagnoses:  Laceration of right foot, initial encounter    ED Discharge Orders          Ordered    azithromycin  (ZITHROMAX ) 500 MG tablet  Daily        06/17/24 0447    cephALEXin  (KEFLEX ) 500 MG capsule  4 times daily        06/17/24 0447               Bernis Ernst, PA-C 06/17/24 0526    Melvenia Motto, MD 06/17/24 [phone]
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
## 2024-06-17 NOTE — Transitions of Care (Post Inpatient/ED Visit) (Signed)
   06/17/2024  Name: Bethany Harris MRN: [id number] DOB: Aug 28, 2001  Today's TOC FU Call Status: Today's TOC FU Call Status:: Unsuccessful Call (1st Attempt) Unsuccessful Call (1st Attempt) Date: 06/17/24  Attempted to reach the patient regarding the most recent Inpatient/ED visit.  Follow Up Plan: Additional outreach attempts will be made to reach the patient to complete the Transitions of Care (Post Inpatient/ED visit) call.   Signature  [employer], [place]

## 2024-06-18 ENCOUNTER — Ambulatory Visit

## 2024-06-18 ENCOUNTER — Telehealth: Payer: Self-pay

## 2024-06-18 NOTE — Transitions of Care (Post Inpatient/ED Visit) (Signed)
 06/18/2024  Name: Bethany Harris MRN: [id number] DOB: 11-Jul-2001  Today's TOC FU Call Status:   Patient's Name and Date of Birth confirmed.  Transition Care Management Follow-up Telephone Call Date of Discharge: 06/17/24 Discharge Facility: [facility]) Type of Discharge: Emergency Department How have you been since you were released from the hospital?: Better Any questions or concerns?: No  Items Reviewed: Did you receive and understand the discharge instructions provided?: Yes Medications obtained,verified, and reconciled?: Yes (Medications Reviewed) Any new allergies since your discharge?: No Dietary orders reviewed?: NA Do you have support at home?: Yes People in Home [RPT]: significant other  Medications Reviewed Today: Medications Reviewed Today     Reviewed by Starlene Charlynn BIRCH, CMA (Certified Medical Assistant) on 06/18/24 at 1223  Med List Status: <None>   Medication Order Taking? Sig Documenting Provider Last Dose Status Informant  acetaminophen  (TYLENOL ) 500 MG tablet [id number]  Take 2 tablets (1,000 mg total) by mouth every 6 (six) hours. Cleatus Moccasin, MD  Active Self, Pharmacy Records  aspirin  EC 81 MG tablet [id number]  Take 1 tablet (81 mg total) by mouth daily. Take for 6 weeks after delivery  Patient not taking: Reported on 05/16/2024   Eldonna Suzen Octave, MD  Active   azithromycin  (ZITHROMAX ) 500 MG tablet [id number]  Take 1 tablet (500 mg total) by mouth daily. Take first 2 tablets together, then 1 every day until finished. Bernis Ernst, PA-C  Active   cephALEXin  (KEFLEX ) 500 MG capsule [id number]  Take 1 capsule (500 mg total) by mouth 4 (four) times daily. Bernis Ernst, PA-C  Active   coconut oil OIL [id number]  Apply 1 Application topically as needed. Eldonna Suzen Octave, MD  Active   cyclobenzaprine  (FLEXERIL ) 10 MG tablet [id number]  Take 1 tablet (10 mg total) by mouth 3 (three) times daily.  Patient not taking: Reported on 05/16/2024   Cresenzo,  John V, MD  Active   dibucaine (NUPERCAINAL) 1 % OINT [id number]  Place 1 Application rectally as needed for hemorrhoids.  Patient not taking: Reported on 05/16/2024   Eldonna Suzen Octave, MD  Active   doxylamine , Sleep, (UNISOM ) 25 MG tablet [id number]  Take 1 tablet (25 mg total) by mouth at bedtime as needed for sleep.  Patient not taking: Reported on 05/16/2024   Eveline Lynwood MATSU, MD  Active Self, Pharmacy Records  famotidine  (PEPCID ) 20 MG tablet [id number]  Take 1 tablet (20 mg total) by mouth 2 (two) times daily.  Patient not taking: Reported on 05/16/2024   Davis, Devon E, PA-C  Active   Ferric Maltol  (ACCRUFER ) 30 MG CAPS [id number]  Take 1 capsule (30 mg total) by mouth every other day. Tad Arland POUR, CNM  Active   gabapentin  (NEURONTIN ) 100 MG capsule [id number]  Take 2 capsules (200 mg total) by mouth 3 (three) times daily for 10 days.  Patient not taking: Reported on 05/16/2024   Eldonna Suzen Octave, MD  Expired 04/16/24 2359   ibuprofen  (ADVIL ) 600 MG tablet [id number]  Take 1 tablet (600 mg total) by mouth every 6 (six) hours. Eldonna Suzen Octave, MD  Active   meclizine  (ANTIVERT ) 12.5 MG tablet [id number]  Take 1 tablet (12.5 mg total) by mouth 3 (three) times daily as needed for dizziness. Use for nausea and vomiting.  Patient not taking: Reported on 05/16/2024   Davis, Devon E, PA-C  Active   ondansetron  (ZOFRAN -ODT) 4 MG disintegrating tablet [id number]  Take 4 mg by mouth every 8 (eight) hours as needed.  Patient not taking: Reported on 05/16/2024   [provider]  Active   oxyCODONE  (OXY IR/ROXICODONE ) 5 MG immediate release tablet [id number]  Take 1 tablet (5 mg total) by mouth every 4 (four) hours as needed for severe pain (pain score 7-10).  Patient not taking: Reported on 05/16/2024   Eldonna Suzen Octave, MD  Active   Prenatal Vit-Fe Fumarate-FA (PREPLUS) 27-1 MG TABS [id number]  Take 1 tablet by mouth daily.  Patient not taking: Reported on 05/16/2024   Zina Jerilynn LABOR, MD  Active Self, Pharmacy Records  SENNA-TIME 8.6 MG tablet [id number]  Take 2 tablets by mouth at bedtime as needed.  Patient not taking: Reported on 05/16/2024   [provider]  Active   witch hazel-glycerin  (TUCKS) pad [id number]  Apply 1 Application topically as needed for hemorrhoids.  Patient not taking: Reported on 05/16/2024   Eldonna Suzen Octave, MD  Active             Home Care and Equipment/Supplies: Were Home Health Services Ordered?: NA Any new equipment or medical supplies ordered?: NA  Functional Questionnaire: Do you need assistance with bathing/showering or dressing?: No Do you need assistance with meal preparation?: No Do you need assistance with eating?: No Do you have difficulty maintaining continence: No Do you need assistance with getting out of bed/getting out of a chair/moving?: No Do you have difficulty managing or taking your medications?: No  Follow up appointments reviewed: PCP Follow-up appointment confirmed?: Yes Date of PCP follow-up appointment?: 06/19/24 Specialist Hospital Follow-up appointment confirmed?: NA Do you need transportation to your follow-up appointment?: No Do you understand care options if your condition(s) worsen?: Yes-patient verbalized understanding    SIGNATURE Karalynn Cottone, RMA

## 2024-06-19 ENCOUNTER — Encounter: Payer: Self-pay | Admitting: Nurse Practitioner

## 2024-06-19 ENCOUNTER — Ambulatory Visit (INDEPENDENT_AMBULATORY_CARE_PROVIDER_SITE_OTHER): Payer: Self-pay | Admitting: Nurse Practitioner

## 2024-06-19 VITALS — BP 97/67 | HR 118 | Temp 97.6°F | Wt 137.0 lb

## 2024-06-19 DIAGNOSIS — S91311A Laceration without foreign body, right foot, initial encounter: Secondary | ICD-10-CM
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
## 2024-06-25 ENCOUNTER — Ambulatory Visit (INDEPENDENT_AMBULATORY_CARE_PROVIDER_SITE_OTHER): Payer: Self-pay

## 2024-06-25 ENCOUNTER — Ambulatory Visit

## 2024-06-25 VITALS — Ht 64.0 in | Wt 138.2 lb

## 2024-06-25 DIAGNOSIS — R2689 Other abnormalities of gait and mobility: Secondary | ICD-10-CM | POA: Diagnosis not present

## 2024-06-25 DIAGNOSIS — R102 Pelvic and perineal pain: Secondary | ICD-10-CM

## 2024-06-25 DIAGNOSIS — Z4802 Encounter for removal of sutures: Secondary | ICD-10-CM

## 2024-06-25 DIAGNOSIS — M6281 Muscle weakness (generalized): Secondary | ICD-10-CM
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
## 2024-06-25 NOTE — Progress Notes (Signed)
 Right foot suture removal per Tonya patient tolerated well. Healing fine, patient will follow up if she has any concerns.   [employer], RMA

## 2024-06-26 ENCOUNTER — Ambulatory Visit: Admitting: Obstetrics and Gynecology

## 2024-06-26 ENCOUNTER — Other Ambulatory Visit: Payer: Self-pay

## 2024-06-26 ENCOUNTER — Other Ambulatory Visit (HOSPITAL_COMMUNITY)
Admission: RE | Admit: 2024-06-26 | Discharge: 2024-06-26 | Disposition: A | Discharge status: Home / Self Care | Source: Ambulatory Visit | Attending: Obstetrics and Gynecology | Admitting: Obstetrics and Gynecology

## 2024-06-26 ENCOUNTER — Encounter: Payer: Self-pay | Admitting: Obstetrics and Gynecology

## 2024-06-26 ENCOUNTER — Telehealth: Admitting: Physician Assistant

## 2024-06-26 VITALS — BP 101/66 | HR 102 | Wt 137.8 lb

## 2024-06-26 DIAGNOSIS — Z3202 Encounter for pregnancy test, result negative: Secondary | ICD-10-CM | POA: Diagnosis not present

## 2024-06-26 DIAGNOSIS — Z124 Encounter for screening for malignant neoplasm of cervix: Secondary | ICD-10-CM | POA: Diagnosis present

## 2024-06-26 DIAGNOSIS — M545 Low back pain, unspecified: Secondary | ICD-10-CM

## 2024-06-26 DIAGNOSIS — Z975 Presence of (intrauterine) contraceptive device: Secondary | ICD-10-CM

## 2024-06-26 DIAGNOSIS — N946 Dysmenorrhea, unspecified: Secondary | ICD-10-CM

## 2024-06-26 DIAGNOSIS — T839XXA Unspecified complication of genitourinary prosthetic device, implant and graft, initial encounter: Secondary | ICD-10-CM

## 2024-06-26 DIAGNOSIS — Z30430 Encounter for insertion of intrauterine contraceptive device: Secondary | ICD-10-CM

## 2024-06-26 LAB — POCT PREGNANCY, URINE: Preg Test, Ur: NEGATIVE

## 2024-06-26 MED ORDER — IBUPROFEN 800 MG PO TABS
800.0000 mg | ORAL_TABLET | Freq: Once | ORAL | Status: AC
  Administered 2024-06-26: 800 mg via ORAL

## 2024-06-26 MED ORDER — PARAGARD INTRAUTERINE COPPER IU IUD
1.0000 | INTRAUTERINE_SYSTEM | Freq: Once | INTRAUTERINE | Status: AC
  Administered 2024-06-26: 1 via INTRAUTERINE
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
## 2024-06-27 NOTE — Progress Notes (Signed)
 Because of having an IUD placed recently and having symptoms of pain and bleeding,  I feel your condition warrants further evaluation and I recommend that you be seen in a face to face visit with your gynecologist or at one of our [facility] clinics.  You should contact your provider that placed the IUD.    NOTE: There will be NO CHARGE for this eVisit   If you are having a true medical emergency please call 911.    *Center for [facility] at [employer] [facility]             [address], [place], [place] [address] [phone] (*Take patients with no insurance)  *Center for [employer] at [employer] [address] [place], [place],  [place]  [address] [phone] (*Take patients with no insurance)  Center for [employer] at [employer]                                                             [address], [place], [place], [address] [phone]  Center for [facility] at [facility] [address], [address], [place], [place], [address] [phone]  Center for [facility] at [facility] [address], [address], [place], [place], [address] [phone]  Center for [facility] at [facility]                                 [address] [place], [place], [place], [address] [phone]  Center for [facility] at [facility]                                    [address] [place], [place], [address] [phone]  Center for [facility] at [facility] [address], [place], [place], [address]                              [facility] [place] [address], [address], [place], [place], [address] [phone]  Your MyChart E-visit questionnaire answers were reviewed by a board certified advanced clinical practitioner to complete your personal care plan based on your specific symptoms.  Thank you for using e-Visits.     I have spent 5  minutes in review of e-visit questionnaire, review and updating patient chart, medical decision making and response to patient.   Delon CHRISTELLA Dickinson, PA-C
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
## 2024-07-02 ENCOUNTER — Ambulatory Visit: Payer: Self-pay | Admitting: Obstetrics and Gynecology

## 2024-07-02 ENCOUNTER — Ambulatory Visit: Attending: Student

## 2024-07-02 DIAGNOSIS — R2689 Other abnormalities of gait and mobility: Secondary | ICD-10-CM | POA: Insufficient documentation

## 2024-07-02 DIAGNOSIS — M5459 Other low back pain: Secondary | ICD-10-CM | POA: Insufficient documentation

## 2024-07-02 DIAGNOSIS — M6281 Muscle weakness (generalized): Secondary | ICD-10-CM | POA: Insufficient documentation

## 2024-07-02 LAB — CYTOLOGY - PAP: Diagnosis: NEGATIVE
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
## 2024-07-11 ENCOUNTER — Ambulatory Visit

## 2024-07-11 DIAGNOSIS — M6281 Muscle weakness (generalized): Secondary | ICD-10-CM

## 2024-07-11 DIAGNOSIS — R2689 Other abnormalities of gait and mobility: Secondary | ICD-10-CM | POA: Diagnosis present

## 2024-07-11 DIAGNOSIS — M5459 Other low back pain: Secondary | ICD-10-CM

## 2024-07-14 ENCOUNTER — Other Ambulatory Visit: Payer: Self-pay

## 2024-07-14 ENCOUNTER — Encounter (HOSPITAL_COMMUNITY): Payer: Self-pay

## 2024-07-14 ENCOUNTER — Emergency Department (HOSPITAL_COMMUNITY)
Admission: EM | Admit: 2024-07-14 | Discharge: 2024-07-15 | Disposition: A | Discharge status: Home / Self Care | Attending: Emergency Medicine | Admitting: Emergency Medicine

## 2024-07-14 DIAGNOSIS — R519 Headache, unspecified: Secondary | ICD-10-CM | POA: Insufficient documentation

## 2024-07-14 DIAGNOSIS — M5441 Lumbago with sciatica, right side: Secondary | ICD-10-CM | POA: Diagnosis not present

## 2024-07-14 DIAGNOSIS — R1084 Generalized abdominal pain: Secondary | ICD-10-CM | POA: Insufficient documentation

## 2024-07-14 DIAGNOSIS — R42 Dizziness and giddiness: Secondary | ICD-10-CM | POA: Insufficient documentation

## 2024-07-14 DIAGNOSIS — M5431 Sciatica, right side: Secondary | ICD-10-CM

## 2024-07-14 DIAGNOSIS — R1030 Lower abdominal pain, unspecified: Secondary | ICD-10-CM | POA: Diagnosis present

## 2024-07-14 LAB — COMPREHENSIVE METABOLIC PANEL WITH GFR
ALT: 16 U/L (ref 0–44)
AST: 19 U/L (ref 15–41)
Albumin: 4 g/dL (ref 3.5–5.0)
Alkaline Phosphatase: 104 U/L (ref 38–126)
Anion gap: 10 (ref 5–15)
BUN: 12 mg/dL (ref 6–20)
CO2: 22 mmol/L (ref 22–32)
Calcium: 8.7 mg/dL — ABNORMAL LOW (ref 8.9–10.3)
Chloride: 107 mmol/L (ref 98–111)
Creatinine, Ser: 0.65 mg/dL (ref 0.44–1.00)
GFR, Estimated: >60 mL/min (ref >60)
Glucose, Bld: 99 mg/dL (ref 70–99)
Potassium: 3.8 mmol/L (ref 3.5–5.1)
Sodium: 139 mmol/L (ref 135–145)
Total Bilirubin: 0.7 mg/dL (ref 0.0–1.2)
Total Protein: 7 g/dL (ref 6.5–8.1)

## 2024-07-14 LAB — URINALYSIS, ROUTINE W REFLEX MICROSCOPIC
Bilirubin Urine: NEGATIVE
Glucose, UA: NEGATIVE mg/dL
Ketones, ur: NEGATIVE mg/dL
Nitrite: NEGATIVE
Protein, ur: 100 mg/dL — AB
RBC / HPF: >50 RBC/hpf (ref 0–5)
Specific Gravity, Urine: 1.024 (ref 1.005–1.030)
pH: 5 (ref 5.0–8.0)

## 2024-07-14 LAB — CBC
HCT: 41.2 % (ref 36.0–46.0)
Hemoglobin: 13.8 g/dL (ref 12.0–15.0)
MCH: 28 pg (ref 26.0–34.0)
MCHC: 33.5 g/dL (ref 30.0–36.0)
MCV: 83.7 fL (ref 80.0–100.0)
Platelets: 233 K/uL (ref 150–400)
RBC: 4.92 MIL/uL (ref 3.87–5.11)
RDW: 12.9 % (ref 11.5–15.5)
WBC: 8.3 K/uL (ref 4.0–10.5)
nRBC: 0 % (ref 0.0–0.2)

## 2024-07-14 LAB — LIPASE, BLOOD: Lipase: 44 U/L (ref 11–51)

## 2024-07-14 LAB — HCG, SERUM, QUALITATIVE: Preg, Serum: NEGATIVE

## 2024-07-14 NOTE — ED Triage Notes (Signed)
 Patient c/o menstrual like cramping x 8 days, pain causing vomiting and light headedness. Patient reports she had IUD placed on 06-25-24 and bled x 4 days after and this is the first time bleeding since then. 10/10 pain at this time.

## 2024-07-15 ENCOUNTER — Emergency Department (HOSPITAL_COMMUNITY)

## 2024-07-15 ENCOUNTER — Ambulatory Visit (INDEPENDENT_AMBULATORY_CARE_PROVIDER_SITE_OTHER): Admitting: Family Medicine

## 2024-07-15 ENCOUNTER — Ambulatory Visit: Admitting: Physical Therapy

## 2024-07-15 VITALS — BP 100/68 | Ht 66.0 in | Wt 136.7 lb

## 2024-07-15 DIAGNOSIS — S329XXD Fracture of unspecified parts of lumbosacral spine and pelvis, subsequent encounter for fracture with routine healing: Secondary | ICD-10-CM | POA: Diagnosis not present

## 2024-07-15 DIAGNOSIS — M5416 Radiculopathy, lumbar region: Secondary | ICD-10-CM

## 2024-07-15 MED ORDER — METHYLPREDNISOLONE ACETATE 80 MG/ML IJ SUSP
80.0000 mg | Freq: Once | INTRAMUSCULAR | Status: AC
  Administered 2024-07-15: 80 mg via INTRA_ARTICULAR

## 2024-07-15 MED ORDER — KETOROLAC TROMETHAMINE 60 MG/2ML IM SOLN
60.0000 mg | Freq: Once | INTRAMUSCULAR | Status: AC
  Administered 2024-07-15: 60 mg via INTRAMUSCULAR

## 2024-07-15 MED ORDER — MORPHINE SULFATE (PF) 4 MG/ML IV SOLN
4.0000 mg | Freq: Once | INTRAVENOUS | Status: AC
  Administered 2024-07-15: 4 mg via INTRAVENOUS
  Filled 2024-07-15: qty 1

## 2024-07-15 MED ORDER — SODIUM CHLORIDE 0.9 % IV BOLUS
1000.0000 mL | Freq: Once | INTRAVENOUS | Status: AC
  Administered 2024-07-15: 1000 mL via INTRAVENOUS

## 2024-07-15 MED ORDER — ONDANSETRON HCL 4 MG/2ML IJ SOLN
4.0000 mg | Freq: Once | INTRAMUSCULAR | Status: AC
  Administered 2024-07-15: 4 mg via INTRAVENOUS
  Filled 2024-07-15: qty 2
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
## 2024-07-15 NOTE — Discharge Instructions (Signed)
 You were evaluated in the Emergency Department and after careful evaluation, we did not find any emergent condition requiring admission or further testing in the hospital.  Your exam/testing today is overall reassuring.  Continue Tylenol  or Motrin  for discomfort and follow-up with your OB/GYN.  Please return to the Emergency Department if you experience any worsening of your condition.   Thank you for allowing us  to be a part of your care.

## 2024-07-16 ENCOUNTER — Encounter: Admitting: Physical Therapy

## 2024-07-16 ENCOUNTER — Encounter: Payer: Self-pay | Admitting: Family Medicine
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
## 2024-07-18 ENCOUNTER — Ambulatory Visit (INDEPENDENT_AMBULATORY_CARE_PROVIDER_SITE_OTHER): Payer: Self-pay | Admitting: Cardiology

## 2024-07-18 ENCOUNTER — Encounter: Payer: Self-pay | Admitting: Cardiology

## 2024-07-18 ENCOUNTER — Other Ambulatory Visit

## 2024-07-18 VITALS — BP 110/70 | HR 100 | Wt 138.0 lb

## 2024-07-18 DIAGNOSIS — R002 Palpitations: Secondary | ICD-10-CM | POA: Diagnosis not present

## 2024-07-18 NOTE — Patient Instructions (Signed)
 Medication Instructions:  Your physician recommends that you continue on your current medications as directed. Please refer to the Current Medication list given to you today.  *If you need a refill on your cardiac medications before your next appointment, please call your pharmacy*   Testing/Procedures: ZIO XT- Long Term Monitor Instructions  Your physician has requested you wear a ZIO patch monitor for 14 days.  This is a single patch monitor. [employer] supplies one patch monitor per enrollment. Additional stickers are not available. Please do not apply patch if you will be having a Nuclear Stress Test,  Echocardiogram, Cardiac CT, MRI, or Chest Xray during the period you would be wearing the  monitor. The patch cannot be worn during these tests. You cannot remove and re-apply the  ZIO XT patch monitor.  Your ZIO patch monitor will be mailed 3 day USPS to your address on file. It may take 3-5 days  to receive your monitor after you have been enrolled.  Once you have received your monitor, please review the enclosed instructions. Your monitor  has already been registered assigning a specific monitor serial # to you.  Billing and Patient Assistance Program Information  We have supplied [employer] with any of your insurance information on file for billing purposes. [employer] offers a sliding scale Patient Assistance Program for patients that do not have  insurance, or whose insurance does not completely cover the cost of the ZIO monitor.  You must apply for the Patient Assistance Program to qualify for this discounted rate.  To apply, please call [employer] at [phone], select option 4, select option 2, ask to apply for  Patient Assistance Program. Meredeth will ask your household income, and how many people  are in your household. They will quote your out-of-pocket cost based on that information.  [employer] will also be able to set up a 64-month, interest-free payment plan if needed.  Applying  the monitor   Shave hair from upper left chest.  Hold abrader disc by orange tab. Rub abrader in 40 strokes over the upper left chest as  indicated in your monitor instructions.  Clean area with 4 enclosed alcohol pads. Let dry.  Apply patch as indicated in monitor instructions. Patch will be placed under collarbone on left  side of chest with arrow pointing upward.  Rub patch adhesive wings for 2 minutes. Remove white label marked 1. Remove the white  label marked 2. Rub patch adhesive wings for 2 additional minutes.  While looking in a mirror, press and release button in center of patch. A small green light will  flash 3-4 times. This will be your only indicator that the monitor has been turned on.  Do not shower for the first 24 hours. You may shower after the first 24 hours.  Press the button if you feel a symptom. You will hear a small click. Record Date, Time and  Symptom in the Patient Logbook.  When you are ready to remove the patch, follow instructions on the last 2 pages of Patient  Logbook. Stick patch monitor onto the last page of Patient Logbook.  Place Patient Logbook in the blue and white box. Use locking tab on box and tape box closed  securely. The blue and white box has prepaid postage on it. Please place it in the mailbox as  soon as possible. Your physician should have your test results approximately 7 days after the  monitor has been mailed back to [facility].  Call [facility]  at [phone] if you have questions regarding  your ZIO XT patch monitor. Call them immediately if you see an orange light blinking on your  monitor.  If your monitor falls off in less than 4 days, contact our Monitor department at [phone].  If your monitor becomes loose or falls off after 4 days call [employer] at [phone] for  suggestions on securing your monitor   Follow-Up: At [facility], you and your health needs are our priority.  As part  of our continuing mission to provide you with exceptional heart care, our providers are all part of one team.  This team includes your primary Cardiologist (physician) and Advanced Practice Providers or APPs (Physician Assistants and Nurse Practitioners) who all work together to provide you with the care you need, when you need it.  Your next appointment:   1 year(s)  Provider:   Kardie Tobb, DO
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
## 2024-07-21 ENCOUNTER — Ambulatory Visit: Payer: Self-pay

## 2024-07-21 NOTE — Telephone Encounter (Signed)
 FYI Only or Action Required?: FYI only for provider.  Patient was last seen in primary care on 06/19/2024 by Oley Bascom RAMAN, NP.  Called Nurse Triage reporting Vaginal Bleeding.  Symptoms began several days ago.  Interventions attempted: Other: under care of ob gyn.  Symptoms are: unchanged.  Triage Disposition: Home Care- contacting OBGYN, refused triage  Patient/caregiver understands and will follow disposition?: Yes  Copied from CRM #[id number]. Topic: Clinical - Red Word Triage >> Jul 21, 2024  5:42 PM Winona R wrote: Pt calling with pain and too much bleeding after having IUD placed. Pt states its been happening for two weeks. Reason for Disposition  Using hormonal IUD (e.g., Jaydess, Kyleena , Liletta , Mirena , Skyla )  Answer Assessment - Initial Assessment Questions Patient had recent IUD placement and called this office instead of the [facility] to discuss removal. Will proceed to ED for increased pain and bleeding Patient declined triage  Protocols used: Vaginal Bleeding - Abnormal-A-AH

## 2024-07-22 ENCOUNTER — Ambulatory Visit: Admitting: Physical Therapy

## 2024-07-22 ENCOUNTER — Encounter: Payer: Self-pay | Admitting: Physical Therapy

## 2024-07-22 ENCOUNTER — Telehealth: Payer: Self-pay

## 2024-07-22 DIAGNOSIS — R2689 Other abnormalities of gait and mobility: Secondary | ICD-10-CM | POA: Diagnosis not present

## 2024-07-22 DIAGNOSIS — M5459 Other low back pain: Secondary | ICD-10-CM

## 2024-07-22 DIAGNOSIS — M6281 Muscle weakness (generalized): Secondary | ICD-10-CM

## 2024-07-22 NOTE — Transitions of Care (Post Inpatient/ED Visit) (Signed)
   07/22/2024  Name: Armetta Henri MRN: [id number] DOB: [birth year]  Today's TOC FU Call Status: Today's TOC FU Call Status:: Unsuccessful Call (1st Attempt) Unsuccessful Call (1st Attempt) Date: 07/22/24  Attempted to reach the patient regarding the most recent Inpatient/ED visit.  Follow Up Plan: Additional outreach attempts will be made to reach the patient to complete the Transitions of Care (Post Inpatient/ED visit) call.   Signature  [employer], [place]
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
## 2024-07-23 ENCOUNTER — Ambulatory Visit
Admission: RE | Admit: 2024-07-23 | Discharge: 2024-07-23 | Disposition: A | Discharge status: Home / Self Care | Source: Ambulatory Visit | Attending: Family Medicine | Admitting: Family Medicine

## 2024-07-23 DIAGNOSIS — M5416 Radiculopathy, lumbar region: Secondary | ICD-10-CM

## 2024-07-29 ENCOUNTER — Other Ambulatory Visit

## 2024-07-29 ENCOUNTER — Ambulatory Visit

## 2024-07-31 ENCOUNTER — Ambulatory Visit: Admitting: Family Medicine
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
## 2024-08-01 ENCOUNTER — Ambulatory Visit: Admitting: Internal Medicine

## 2024-08-01 ENCOUNTER — Encounter: Payer: Self-pay | Admitting: Internal Medicine

## 2024-08-01 ENCOUNTER — Ambulatory Visit: Attending: Student

## 2024-08-01 VITALS — Ht 64.0 in | Wt 137.0 lb

## 2024-08-01 DIAGNOSIS — R2689 Other abnormalities of gait and mobility: Secondary | ICD-10-CM | POA: Diagnosis present

## 2024-08-01 DIAGNOSIS — M5416 Radiculopathy, lumbar region: Secondary | ICD-10-CM | POA: Diagnosis present

## 2024-08-01 DIAGNOSIS — M5459 Other low back pain: Secondary | ICD-10-CM | POA: Insufficient documentation

## 2024-08-01 DIAGNOSIS — M6281 Muscle weakness (generalized): Secondary | ICD-10-CM | POA: Insufficient documentation
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
## 2024-08-04 ENCOUNTER — Other Ambulatory Visit: Payer: Self-pay

## 2024-08-04 ENCOUNTER — Other Ambulatory Visit: Payer: Self-pay | Admitting: Internal Medicine

## 2024-08-04 DIAGNOSIS — M5416 Radiculopathy, lumbar region: Secondary | ICD-10-CM

## 2024-08-04 NOTE — Progress Notes (Signed)
 Pt called asking for order for lumbar ESI be placed per discussion at her last  visit.  Order placed. Pt states she will call [facility] to schedule.

## 2024-08-05 ENCOUNTER — Encounter

## 2024-08-05 ENCOUNTER — Encounter: Payer: Self-pay | Admitting: Internal Medicine

## 2024-08-08 ENCOUNTER — Encounter

## 2024-08-09 NOTE — Progress Notes (Signed)
 Patient cancelled appointment, may reschedule.   Camie Rote, MSN, CNM, RNC-OB Certified Nurse Midwife, [facility] 08/12/2024 8:42 AM

## 2024-08-11 ENCOUNTER — Encounter: Payer: Self-pay | Admitting: Internal Medicine

## 2024-08-12 ENCOUNTER — Ambulatory Visit: Admitting: Certified Nurse Midwife

## 2024-08-12 DIAGNOSIS — Z30431 Encounter for routine checking of intrauterine contraceptive device: Secondary | ICD-10-CM

## 2024-08-13 ENCOUNTER — Other Ambulatory Visit

## 2024-08-14 ENCOUNTER — Telehealth (HOSPITAL_COMMUNITY): Payer: Self-pay

## 2024-08-14 NOTE — Telephone Encounter (Signed)
-----   Message from Cordella DELENA Banner sent at 08/14/2024  3:22 PM EDT ----- Regarding: RE: ESI Sorry.  Thought it was for a kypho.  Please set it up with dr Karalee.  I will have him proctor me on this case so I can do them going forward. ----- Message ----- From: Carolee Rosina BIRCH Sent: 08/14/2024  12:09 PM EDT To: Wilkie MARLA Karalee, MD; Cordella DELENA Banner, MD Subject: ESI                                            McCullough/Babcock,   I plan to schedule this pt for Rt L4-5 ESI (she has a contrast allergy) at [facility] on 10/1. Is it ok if I put her on for one of you to do? Her most recent MRI is in epic. Please review.   Thanks,  Erwin

## 2024-08-16 ENCOUNTER — Other Ambulatory Visit: Payer: Self-pay | Admitting: Student

## 2024-08-16 DIAGNOSIS — M545 Low back pain, unspecified: Secondary | ICD-10-CM

## 2024-08-16 MED ORDER — DIPHENHYDRAMINE HCL 50 MG PO TABS: 50.0000 mg | ORAL_TABLET | Freq: Once | ORAL | 0 refills | Status: DC

## 2024-08-16 MED ORDER — PREDNISONE 50 MG PO TABS: ORAL_TABLET | ORAL | 0 refills | Status: DC
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
## 2024-08-19 ENCOUNTER — Other Ambulatory Visit (HOSPITAL_COMMUNITY)
Admission: RE | Admit: 2024-08-19 | Discharge: 2024-08-19 | Disposition: A | Discharge status: Home / Self Care | Source: Ambulatory Visit | Attending: Certified Nurse Midwife | Admitting: Certified Nurse Midwife

## 2024-08-19 ENCOUNTER — Ambulatory Visit: Admitting: Certified Nurse Midwife

## 2024-08-19 VITALS — BP 106/72 | HR 106 | Wt 141.0 lb

## 2024-08-19 DIAGNOSIS — Z1331 Encounter for screening for depression: Secondary | ICD-10-CM | POA: Diagnosis not present

## 2024-08-19 DIAGNOSIS — Z30431 Encounter for routine checking of intrauterine contraceptive device: Secondary | ICD-10-CM

## 2024-08-19 DIAGNOSIS — N898 Other specified noninflammatory disorders of vagina: Secondary | ICD-10-CM | POA: Diagnosis present

## 2024-08-20 ENCOUNTER — Telehealth (HOSPITAL_COMMUNITY): Payer: Self-pay

## 2024-08-20 ENCOUNTER — Telehealth (INDEPENDENT_AMBULATORY_CARE_PROVIDER_SITE_OTHER): Admitting: Internal Medicine

## 2024-08-20 ENCOUNTER — Telehealth: Payer: Self-pay | Admitting: *Deleted

## 2024-08-20 DIAGNOSIS — M5416 Radiculopathy, lumbar region: Secondary | ICD-10-CM | POA: Diagnosis present

## 2024-08-20 DIAGNOSIS — M5440 Lumbago with sciatica, unspecified side: Secondary | ICD-10-CM

## 2024-08-20 MED ORDER — PREDNISONE 10 MG (21) PO TBPK: ORAL_TABLET | ORAL | 0 refills | Status: DC

## 2024-08-20 NOTE — Telephone Encounter (Signed)
 Sent ESI for review. Per Dr. Karalee, recommended that we do not proceed. He also called the ordering physician's office to let them know. I have called the pt and cancelled and asked that she reach out to their office to discuss next steps. AB

## 2024-08-20 NOTE — Telephone Encounter (Signed)
 ----- Message from Wilkie MARLA Lent sent at 08/20/2024  1:10 PM EDT ----- Regarding: RE: ESI He was OOT but I did speak to the doc covering for him and gave my recommendations.  He said he will pass on the info.   HKM ----- Message ----- From: Carolee Rosina BIRCH Sent: 08/20/2024   8:37 AM EDT To: Suzen March; Boby Bethel Nest # Subject: RE: GEARLD Lent,   Did you get a chance to speak with the ordering physician yesterday? ----- Message ----- From: Lent Wilkie MARLA, MD Sent: 08/19/2024  12:30 PM EDT To: Suzen March; Boby Bers; [place] # Subject: RE: ESI                                        This entire request is a cluster.  She doesn't even have any real disease on her MRI.  She is 23 years old, this is ridiculous.   Please give me the ordering provider and their phone number.  I will call and recommend they do not proceed.    HKM ----- Message ----- From: Bers Boby Sent: 08/19/2024  12:08 PM EDT To: Suzen March; Nest Slocumb; Wilkie MARLA M# Subject: RE: ESI                                        Wilbern cancelled the appt at our location per Jon the nurse at 315 pt is allergic to Iohexol . Shortness of breath /cough. Was advised to schedule at hospital. ----- Message ----- From: Lent Wilkie MARLA, MD Sent: 08/19/2024  11:41 AM EDT To: Suzen March; Boby Bers; Nest # Subject: RE: ESI                                        I am not aware of any issues with contrast allergy preventing patients from being scheduled at [facility].  They get premedicated all the same.  Luke Boby Nest - can any of you confirm? ----- Message ----- From: Carolee Rosina BIRCH Sent: 08/19/2024  10:10 AM EDT To: Wilkie MARLA Lent, MD; Cordella DELENA Banner, MD Subject: RE: ESI                                        She can't go to [facility] because of her contrast allergy. Is there another alternative? ----- Message  ----- From: Lent Wilkie MARLA, MD Sent: 08/19/2024  10:04 AM EDT To: Cordella DELENA Banner, MD; Rosina BIRCH Carolee Subject: RE: ESI                                        Sorry, we do not do these procedures in the hospital for out-pts.   She must go through [facility].   HKM ----- Message ----- From: Carolee Rosina BIRCH Sent: 08/14/2024  3:33 PM EDT To: Wilkie MARLA Lent, MD; Cordella DELENA Banner, MD Subject: RE: [facility]                                        Oh great, thank you! ----- Message ----- From: Banner Cordella DELENA, MD Sent: 08/14/2024   3:27 PM EDT To: Wilkie MARLA Lent, MD; Rosina JONETTA Edison Subject: RE: ESI                                        Sorry.  Thought it was for a kypho.  Please set it up with dr Lent.  I will have him proctor me on this case so I can do them going forward. ----- Message ----- From: Edison Rosina JONETTA Sent: 08/14/2024  12:09 PM EDT To: Wilkie MARLA Lent, MD; Cordella DELENA Banner, MD Subject: ESI                                            McCullough/Babcock,   I plan to schedule this pt for Rt L4-5 ESI (she has a contrast allergy) at [facility] on 10/1. Is it ok if I put her on for one of you to do? Her most recent MRI is in epic. Please review.   Thanks,  Erwin
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
## 2024-08-20 NOTE — Telephone Encounter (Signed)
 Received a voicemail this am from cytology asking for clarification for what tests provider wants run on specimen sent yesterday with requisiton for cervicoancillary without specific tests marked. Reviewed chart and advised full wet prep. Rock Skip PEAK

## 2024-08-21 ENCOUNTER — Encounter: Payer: Self-pay | Admitting: Internal Medicine

## 2024-08-21 ENCOUNTER — Ambulatory Visit: Payer: Self-pay | Admitting: Certified Nurse Midwife

## 2024-08-21 DIAGNOSIS — B9689 Other specified bacterial agents as the cause of diseases classified elsewhere: Secondary | ICD-10-CM

## 2024-08-21 DIAGNOSIS — B3731 Acute candidiasis of vulva and vagina: Secondary | ICD-10-CM

## 2024-08-21 LAB — CERVICOVAGINAL ANCILLARY ONLY
Bacterial Vaginitis (gardnerella): POSITIVE — AB
Candida Glabrata: NEGATIVE
Candida Vaginitis: POSITIVE — AB
Chlamydia: NEGATIVE
Comment: NEGATIVE
Comment: NEGATIVE
Comment: NEGATIVE
Comment: NEGATIVE
Comment: NEGATIVE
Comment: NORMAL
Neisseria Gonorrhea: NEGATIVE
Trichomonas: NEGATIVE

## 2024-08-21 MED ORDER — METRONIDAZOLE 0.75 % VA GEL: 1.0000 | Freq: Every day | VAGINAL | 1 refills | Status: DC

## 2024-08-21 MED ORDER — TERCONAZOLE 0.4 % VA CREA: 1.0000 | TOPICAL_CREAM | Freq: Every day | VAGINAL | 0 refills | Status: DC

## 2024-08-21 NOTE — Progress Notes (Signed)
 Patient with vaginal swabs positive for candida and bacterial vaginosis.   1. Bacterial vaginosis (Primary) - metroNIDAZOLE  (METROGEL ) 0.75 % vaginal gel; Place 1 Applicatorful vaginally at bedtime. Apply one applicatorful to vagina at bedtime for 5 days  Dispense: 70 g; Refill: 1  2. Vaginal candidiasis - terconazole  (TERAZOL 7 ) 0.4 % vaginal cream; Place 1 applicator vaginally at bedtime.  Dispense: 45 g; Refill: 0  Camie Rote, MSN, CNM, RNC-OB Certified Nurse Midwife, [facility] 08/21/2024 1:19 PM

## 2024-08-27 ENCOUNTER — Ambulatory Visit (HOSPITAL_COMMUNITY): Admission: RE | Admit: 2024-08-27 | Source: Ambulatory Visit

## 2024-09-08 ENCOUNTER — Ambulatory Visit: Payer: Self-pay

## 2024-09-08 NOTE — Telephone Encounter (Signed)
  FYI Only or Action Required?: FYI only for provider.  Patient was last seen in primary care on 06/19/2024 by Oley Bascom RAMAN, NP.  Called Nurse Triage reporting Cough.  Symptoms began yesterday.  Interventions attempted: Rest, hydration, or home remedies.  Symptoms are: unchanged.  Triage Disposition: See Today or Tomorrow in Office (overriding Home Care)  Patient/caregiver understands and will follow disposition?:     Copied from CRM [id number]. Topic: Clinical - Red Word Triage >> Sep 08, 2024  9:09 AM Antony RAMAN wrote: Red Word that prompted transfer to Nurse Triage: 2 days coughing, burning and pain in chest Reason for Disposition  Cough with cold symptoms (e.g., runny nose, postnasal drip, throat clearing)  Answer Assessment - Initial Assessment Questions Additional info: Recovered from flu last week. Calling today for new non productive cough X 2 days with chest burning and pain with cough.     1. ONSET: When did the cough begin?      2 days ago  2. SEVERITY: How bad is the cough today?      Mild  3. SPUTUM: Describe the color of your sputum (e.g., none, dry cough; clear, white, yellow, green)     Dry  4. HEMOPTYSIS: Are you coughing up any blood? If Yes, ask: How much? (e.g., flecks, streaks, tablespoons, etc.)     denies 5. DIFFICULTY BREATHING: Are you having difficulty breathing? If Yes, ask: How bad is it? (e.g., mild, moderate, severe)      denies 6. FEVER: Do you have a fever? If Yes, ask: What is your temperature, how was it measured, and when did it start?     denies 7. CARDIAC HISTORY: Do you have any history of heart disease? (e.g., heart attack, congestive heart failure)       8. LUNG HISTORY: Do you have any history of lung disease?  (e.g., pulmonary embolus, asthma, emphysema)      9. PE RISK FACTORS: Do you have a history of blood clots? (or: recent major surgery, recent prolonged travel, bedridden)      10. OTHER  SYMPTOMS: Do you have any other symptoms? (e.g., runny nose, wheezing, chest pain)       Burning in chest with cough, pain in chest/lung when coughing 11. PREGNANCY: Is there any chance you are pregnant? When was your last menstrual period?        12. TRAVEL: Have you traveled out of the country in the last month? (e.g., travel history, exposures)  Protocols used: Cough - Acute Non-Productive-A-AH

## 2024-09-09 ENCOUNTER — Encounter: Payer: Self-pay | Admitting: Nurse Practitioner

## 2024-09-09 ENCOUNTER — Ambulatory Visit (INDEPENDENT_AMBULATORY_CARE_PROVIDER_SITE_OTHER): Payer: Self-pay | Admitting: Nurse Practitioner

## 2024-09-09 VITALS — BP 104/68 | HR 105 | Ht 64.0 in | Wt 142.0 lb

## 2024-09-09 DIAGNOSIS — R1013 Epigastric pain: Secondary | ICD-10-CM | POA: Insufficient documentation

## 2024-09-09 DIAGNOSIS — M545 Low back pain, unspecified: Secondary | ICD-10-CM

## 2024-09-09 DIAGNOSIS — H6123 Impacted cerumen, bilateral: Secondary | ICD-10-CM | POA: Insufficient documentation

## 2024-09-09 DIAGNOSIS — R051 Acute cough: Secondary | ICD-10-CM | POA: Diagnosis not present

## 2024-09-09 DIAGNOSIS — G8929 Other chronic pain: Secondary | ICD-10-CM

## 2024-09-09 LAB — POC SOFIA 2 FLU + SARS ANTIGEN FIA
Influenza A, POC: NEGATIVE
Influenza B, POC: NEGATIVE
SARS Coronavirus 2 Ag: NEGATIVE

## 2024-09-09 MED ORDER — FAMOTIDINE 20 MG PO TABS: 20.0000 mg | ORAL_TABLET | Freq: Two times a day (BID) | ORAL | 1 refills | Status: DC

## 2024-09-09 MED ORDER — DEBROX 6.5 % OT SOLN
5.0000 [drp] | Freq: Two times a day (BID) | OTIC | 0 refills | Status: AC
Start: 2024-09-09 — End: ?

## 2024-09-09 NOTE — Assessment & Plan Note (Signed)
  Three-day history of dry cough, chest pain, and burning sensation in the back. Differential includes viral infection or acid reflux due to ibuprofen . - Test for flu and COVID-19 was negative Advised Mucinex as needed for cough, recommend 60 fosses of water daily to maintain hydration Plans to get flu vaccine at the pharmacy
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
## 2024-09-09 NOTE — Assessment & Plan Note (Signed)
 Bilateral impacted cerumen obstructing view of the eardrum. - Consider ear irrigation or  at a future appointment if needed has she had to leave for another appointment Debrox advised.

## 2024-09-09 NOTE — Assessment & Plan Note (Signed)
 Prescribed famotidine  20 mg twice daily for 2 weeks and then as needed if symptom persist Avoid excess use of ibuprofen  Avoid fatty fried food, spicy foods caffeinated drinks and other food that triggers symptoms, educational material for GERD provided Follow-up in 4 weeks

## 2024-09-09 NOTE — Assessment & Plan Note (Signed)
 Low back pain, chronic Chronic low back pain possibly related to a disc issue. Excessive ibuprofen  use may lead to complications. Limit use of ibuprofen  as this can be causing dyspepsia , advised Tylenol  650 mg every six hours as needed for pain.

## 2024-09-09 NOTE — Telephone Encounter (Signed)
Pt was seen today. Bethany Harris

## 2024-09-09 NOTE — Patient Instructions (Addendum)
    Dyspepsia  - famotidine  (PEPCID ) 20 MG tablet; Take 1 tablet (20 mg total) by mouth 2 (two) times daily.  Dispense: 30 tablet; Refill: 1  Take famotidine  20 mg twice daily for 2 weeks; if symptoms improve, may continue  medication as needed   Avoid fatty fried foods, spicy foods, caffeinated drinks and other food that triggers your symptoms   For back pain okay to take Tylenol  650 mg every 6 hours as needed, alternate with ibuprofen  as this can cause heartburn  Impacted cerumen of both ears  - carbamide peroxide (DEBROX) 6.5 % OTIC solution; Place 5 drops into both ears 2 (two) times daily.  Dispense: 15 mL; Refill: 0 Do not use Qtips    .  It is important that you exercise regularly at least 30 minutes 5 times a week as tolerated  Think about what you will eat, plan ahead. Choose  clean, green, fresh or frozen over canned, processed or packaged foods which are more sugary, salty and fatty. 70 to 75% of food eaten should be vegetables and fruit. Three meals at set times with snacks allowed between meals, but they must be fruit or vegetables. Aim to eat over a 12 hour period , example 7 am to 7 pm, and STOP after  your last meal of the day. Drink water,generally about 64 ounces per day, no other drink is as healthy. Fruit juice is best enjoyed in a healthy way, by EATING the fruit.  Thanks for choosing [facility] we consider it a privelige to serve you.

## 2024-09-16 ENCOUNTER — Other Ambulatory Visit: Payer: Self-pay | Admitting: Nurse Practitioner

## 2024-09-16 DIAGNOSIS — R1013 Epigastric pain: Secondary | ICD-10-CM

## 2024-09-24 ENCOUNTER — Ambulatory Visit: Admitting: Physical Medicine and Rehabilitation

## 2024-09-29 ENCOUNTER — Encounter: Payer: Self-pay | Admitting: Radiology

## 2024-10-08 ENCOUNTER — Encounter: Payer: Self-pay | Admitting: Nurse Practitioner

## 2024-10-08 ENCOUNTER — Ambulatory Visit (HOSPITAL_COMMUNITY)
Admission: RE | Admit: 2024-10-08 | Discharge: 2024-10-08 | Disposition: A | Discharge status: Home / Self Care | Source: Ambulatory Visit | Attending: Nurse Practitioner | Admitting: Nurse Practitioner

## 2024-10-08 ENCOUNTER — Ambulatory Visit: Payer: Self-pay | Admitting: Nurse Practitioner

## 2024-10-08 ENCOUNTER — Ambulatory Visit (INDEPENDENT_AMBULATORY_CARE_PROVIDER_SITE_OTHER): Payer: Self-pay | Admitting: Nurse Practitioner

## 2024-10-08 VITALS — BP 118/69 | HR 118 | Wt 140.2 lb

## 2024-10-08 DIAGNOSIS — R053 Chronic cough: Secondary | ICD-10-CM

## 2024-10-08 MED ORDER — AZITHROMYCIN 250 MG PO TABS: ORAL_TABLET | ORAL | 0 refills | Status: AC

## 2024-10-08 MED ORDER — IBUPROFEN 600 MG PO TABS
600.0000 mg | ORAL_TABLET | Freq: Four times a day (QID) | ORAL | 1 refills | Status: AC
Start: 2024-10-08 — End: ?

## 2024-10-08 MED ORDER — OMEPRAZOLE 20 MG PO CPDR: 20.0000 mg | DELAYED_RELEASE_CAPSULE | Freq: Every day | ORAL | 3 refills | Status: AC

## 2024-10-08 MED ORDER — FEXOFENADINE HCL 180 MG PO TABS: 180.0000 mg | ORAL_TABLET | Freq: Every day | ORAL | 2 refills | Status: AC
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
## 2024-10-09 ENCOUNTER — Ambulatory Visit: Admitting: Physical Medicine and Rehabilitation

## 2024-10-15 ENCOUNTER — Ambulatory Visit (INDEPENDENT_AMBULATORY_CARE_PROVIDER_SITE_OTHER): Admitting: Nurse Practitioner

## 2024-10-15 VITALS — BP 118/73 | HR 109 | Wt 138.4 lb

## 2024-10-15 DIAGNOSIS — R21 Rash and other nonspecific skin eruption: Secondary | ICD-10-CM | POA: Diagnosis not present

## 2024-10-15 MED ORDER — TRIAMCINOLONE ACETONIDE 0.1 % EX CREA: 1.0000 | TOPICAL_CREAM | Freq: Two times a day (BID) | CUTANEOUS | 0 refills | Status: AC
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
## 2024-10-15 NOTE — Patient Instructions (Signed)
     Dental Resources  [facility]   [address]   Cleaning: $5 [place], [place] [address]  Xray: $5 [phone]. [phone]  Call to get on waiting list   Dr. Royston Cornea McMasters/ Dr. Bonnielee Buttery [address]    Xray: $85 each [place], [place] [address]  Extraction $200 [phone]   Dr. Lovena Rubinstein Turner/ Dr. Glorine Laroche  [address]    Exam, Cleaning, Xray: $262 [place]   Extraction: $324-$401 [phone]  Dr. Michiel Ala   [address]   Extraction: $300 per tooth  [place] [address]  [phone]   Dr. Crissie Dome  [address]    Cleaning: $300  [place], [place] [address]  Extraction: [phone]  [place]   Dr. Alessandra Ancona  [address]    Exam: $85 [place], [place]    Extraction: $120 and up  [phone]    *full list of prices available*  [facility] [address]  Exam: $94 [place], [place]    Exam with Xrays: $380  [phone]    Xrays: $68 and up       Cleaning: $101       Extraction $190 and up  [facility]    [address]    Cleaning + Xray: $344 [place]    Extraction: pt must be seen first for price [phone]  [facility] [address]   Emergency Exam: $65 [place] [address]   Cleaning & Exam: $150  [phone]    Extractions: simple $180; surgical $250      Fillings: [id number]

## 2024-10-16 ENCOUNTER — Ambulatory Visit: Payer: Self-pay | Admitting: Nurse Practitioner

## 2024-10-16 ENCOUNTER — Ambulatory Visit: Admitting: Physical Medicine and Rehabilitation

## 2024-10-16 ENCOUNTER — Encounter: Payer: Self-pay | Admitting: Physical Medicine and Rehabilitation

## 2024-10-16 DIAGNOSIS — M5116 Intervertebral disc disorders with radiculopathy, lumbar region: Secondary | ICD-10-CM | POA: Diagnosis not present

## 2024-10-16 DIAGNOSIS — G8929 Other chronic pain: Secondary | ICD-10-CM

## 2024-10-16 DIAGNOSIS — M5416 Radiculopathy, lumbar region: Secondary | ICD-10-CM
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
## 2024-10-16 NOTE — Progress Notes (Signed)
 Pain Scale   Average Pain 4 Patient advising she has lower back pain on the right side pain is constant.        +Driver, -BT, -Dye Allergies.

## 2024-10-27 ENCOUNTER — Emergency Department (HOSPITAL_COMMUNITY)

## 2024-10-27 ENCOUNTER — Telehealth: Payer: Self-pay

## 2024-10-27 ENCOUNTER — Other Ambulatory Visit: Payer: Self-pay

## 2024-10-27 ENCOUNTER — Emergency Department (HOSPITAL_COMMUNITY)
Admission: EM | Admit: 2024-10-27 | Discharge: 2024-10-27 | Disposition: A | Discharge status: Home / Self Care | Attending: Emergency Medicine | Admitting: Emergency Medicine

## 2024-10-27 DIAGNOSIS — G8929 Other chronic pain: Secondary | ICD-10-CM | POA: Insufficient documentation

## 2024-10-27 DIAGNOSIS — R519 Headache, unspecified: Secondary | ICD-10-CM | POA: Diagnosis present

## 2024-10-27 DIAGNOSIS — M545 Low back pain, unspecified: Secondary | ICD-10-CM | POA: Diagnosis not present

## 2024-10-27 DIAGNOSIS — G43809 Other migraine, not intractable, without status migrainosus: Secondary | ICD-10-CM | POA: Diagnosis not present

## 2024-10-27 LAB — RESP PANEL BY RT-PCR (RSV, FLU A&B, COVID)  RVPGX2
Influenza A by PCR: NEGATIVE
Influenza B by PCR: NEGATIVE
Resp Syncytial Virus by PCR: NEGATIVE
SARS Coronavirus 2 by RT PCR: NEGATIVE

## 2024-10-27 LAB — COMPREHENSIVE METABOLIC PANEL WITH GFR
ALT: 10 U/L (ref 0–44)
AST: 15 U/L (ref 15–41)
Albumin: 4.7 g/dL (ref 3.5–5.0)
Alkaline Phosphatase: 126 U/L (ref 38–126)
Anion gap: 10 (ref 5–15)
BUN: 20 mg/dL (ref 6–20)
CO2: 22 mmol/L (ref 22–32)
Calcium: 9.9 mg/dL (ref 8.9–10.3)
Chloride: 107 mmol/L (ref 98–111)
Creatinine, Ser: 0.58 mg/dL (ref 0.44–1.00)
GFR, Estimated: >60 mL/min (ref >60)
Glucose, Bld: 108 mg/dL — ABNORMAL HIGH (ref 70–99)
Potassium: 4.1 mmol/L (ref 3.5–5.1)
Sodium: 140 mmol/L (ref 135–145)
Total Bilirubin: 0.6 mg/dL (ref 0.0–1.2)
Total Protein: 7.4 g/dL (ref 6.5–8.1)

## 2024-10-27 LAB — CBC WITH DIFFERENTIAL/PLATELET
Abs Immature Granulocytes: 0.01 K/uL (ref 0.00–0.07)
Basophils Absolute: 0 K/uL (ref 0.0–0.1)
Basophils Relative: 1 %
Eosinophils Absolute: 0.3 K/uL (ref 0.0–0.5)
Eosinophils Relative: 4 %
HCT: 42.5 % (ref 36.0–46.0)
Hemoglobin: 14.2 g/dL (ref 12.0–15.0)
Immature Granulocytes: 0 %
Lymphocytes Relative: 24 %
Lymphs Abs: 1.7 K/uL (ref 0.7–4.0)
MCH: 28.2 pg (ref 26.0–34.0)
MCHC: 33.4 g/dL (ref 30.0–36.0)
MCV: 84.3 fL (ref 80.0–100.0)
Monocytes Absolute: 0.5 K/uL (ref 0.1–1.0)
Monocytes Relative: 7 %
Neutro Abs: 4.4 K/uL (ref 1.7–7.7)
Neutrophils Relative %: 64 %
Platelets: 179 K/uL (ref 150–400)
RBC: 5.04 MIL/uL (ref 3.87–5.11)
RDW: 12.1 % (ref 11.5–15.5)
WBC: 6.9 K/uL (ref 4.0–10.5)
nRBC: 0 % (ref 0.0–0.2)

## 2024-10-27 LAB — HCG, SERUM, QUALITATIVE: Preg, Serum: NEGATIVE

## 2024-10-27 MED ORDER — METOCLOPRAMIDE HCL 5 MG/ML IJ SOLN: 10.0000 mg | Freq: Once | INTRAMUSCULAR | Status: DC

## 2024-10-27 MED ORDER — MAGNESIUM SULFATE 2 GM/50ML IV SOLN
2.0000 g | Freq: Once | INTRAVENOUS | Status: AC
  Administered 2024-10-27: 2 g via INTRAVENOUS
  Filled 2024-10-27: qty 50

## 2024-10-27 MED ORDER — ACETAMINOPHEN 500 MG PO TABS
1000.0000 mg | ORAL_TABLET | Freq: Once | ORAL | Status: AC
  Administered 2024-10-27: 1000 mg via ORAL
  Filled 2024-10-27: qty 2

## 2024-10-27 MED ORDER — SODIUM CHLORIDE 0.9 % IV BOLUS
1000.0000 mL | Freq: Once | INTRAVENOUS | Status: AC
  Administered 2024-10-27: 1000 mL via INTRAVENOUS

## 2024-10-27 MED ORDER — KETOROLAC TROMETHAMINE 15 MG/ML IJ SOLN
15.0000 mg | Freq: Once | INTRAMUSCULAR | Status: AC
  Administered 2024-10-27: 15 mg via INTRAVENOUS
  Filled 2024-10-27: qty 1

## 2024-10-27 NOTE — Telephone Encounter (Signed)
 Copied from CRM #[id number]. 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   Patient can be reached at [phone]  Please advise if you will place orders for TB gold testing. Thank you. KH

## 2024-10-27 NOTE — Discharge Instructions (Signed)
 You were seen in the ER today for concerns of a headache and back pain. Your labs and imaging were reassuring and you were able to get significant improvement in your headache with medications given to you here in the ER. I would recommend you follow up closely with your primary care provider for further evaluation as needed. Return to the ER for any concerns of new or worsening symptoms.

## 2024-10-27 NOTE — ED Triage Notes (Signed)
 Pt ambulatory to triage with complaints of a generalized headache that began yesterday, and has worsened. PT also has complaints of RIGHT sided back pain that moves down her leg. PT states that acetaminophen  and ibuprofen  has not helped her sx.

## 2024-10-27 NOTE — ED Notes (Signed)
 Pt to CT

## 2024-10-27 NOTE — ED Provider Notes (Signed)
 [place] EMERGENCY DEPARTMENT AT [facility] Provider Note   CSN: [id number] Arrival date & time: 10/27/24  1233     Patient presents with: Headache and Back Pain   Bethany Harris is a 23 y.o. female.  Patient history significant for migraine headaches, depression, anxiety, currently breast-feeding presents to the emergency department with concerns of headache and back pain.  Reports ongoing generalized headache that originated on the right side of her head that has progressed to the head.  Endorses some photosensitivity and sound sensitivity.  Reports history of migraines but this feels slightly worse than her baseline migraines although it has been over a year since she last experienced a migraine.  She does endorse that she is currently breast-feeding.  She tried taking Tylenol  and ibuprofen  without improvement in symptoms.  No fevers.   Headache Associated symptoms: back pain   Back Pain Associated symptoms: headaches        Prior to Admission medications   Medication Sig Start Date End Date Taking? Authorizing Provider  carbamide peroxide (DEBROX) 6.5 % OTIC solution Place 5 drops into both ears 2 (two) times daily. Patient not taking: Reported on 10/15/2024 09/09/24   Paseda, Folashade R, FNP  famotidine  (PEPCID ) 20 MG tablet TAKE 1 TABLET BY MOUTH TWICE A DAY 09/17/24   Nichols, Tonya S, NP  fexofenadine  (ALLEGRA  ALLERGY) 180 MG tablet Take 1 tablet (180 mg total) by mouth daily. Patient not taking: Reported on 10/15/2024 10/08/24   Oley Bascom RAMAN, NP  ibuprofen  (ADVIL ) 600 MG tablet Take 1 tablet (600 mg total) by mouth every 6 (six) hours. 10/08/24   Oley Bascom RAMAN, NP  omeprazole  (PRILOSEC) 20 MG capsule Take 1 capsule (20 mg total) by mouth daily. 10/08/24   Oley Bascom RAMAN, NP  triamcinolone  cream (KENALOG ) 0.1 % Apply 1 Application topically 2 (two) times daily. 10/15/24   Oley Bascom RAMAN, NP    Allergies: Iohexol     Review of Systems   Musculoskeletal:  Positive for back pain.  Neurological:  Positive for headaches.  All other systems reviewed and are negative.   Updated Vital Signs BP 103/77 (BP Location: Left Arm)   Pulse (!) 102   Temp 97.9 F (36.6 C) (Oral)   Resp 18   LMP 10/05/2024 (Approximate)   SpO2 100%   Physical Exam Vitals and nursing note reviewed.  Constitutional:      General: She is not in acute distress.    Appearance: She is well-developed.  HENT:     Head: Normocephalic and atraumatic.  Eyes:     Conjunctiva/sclera: Conjunctivae normal.  Neck:     Meningeal: Brudzinski's sign and Kernig's sign absent.  Cardiovascular:     Rate and Rhythm: Normal rate and regular rhythm.     Heart sounds: No murmur heard. Pulmonary:     Effort: Pulmonary effort is normal. No respiratory distress.     Breath sounds: Normal breath sounds.  Abdominal:     Palpations: Abdomen is soft.     Tenderness: There is no abdominal tenderness.  Musculoskeletal:        General: No swelling.     Cervical back: Normal range of motion and neck supple. No rigidity.  Skin:    General: Skin is warm and dry.     Capillary Refill: Capillary refill takes less than 2 seconds.  Neurological:     Mental Status: She is alert.  Psychiatric:        Mood and Affect: Mood normal.     (  all labs ordered are listed, but only abnormal results are displayed) Labs Reviewed  COMPREHENSIVE METABOLIC PANEL WITH GFR - Abnormal; Notable for the following components:      Result Value   Glucose, Bld 108 (*)    All other components within normal limits  RESP PANEL BY RT-PCR (RSV, FLU A&B, COVID)  [id number]  CBC WITH DIFFERENTIAL/PLATELET  HCG, SERUM, QUALITATIVE    EKG: None  Radiology: CT Head Wo Contrast Result Date: 10/27/2024 EXAM: CT HEAD WITHOUT CONTRAST 10/27/2024 01:28:14 PM TECHNIQUE: CT of the head was performed without the administration of intravenous contrast. Automated exposure control, iterative reconstruction,  and/or weight based adjustment of the mA/kV was utilized to reduce the radiation dose to as low as reasonably achievable. COMPARISON: CT head 02/01/2024 CLINICAL HISTORY: Headache, increasing frequency or severity FINDINGS: BRAIN AND VENTRICLES: No acute hemorrhage. No evidence of acute infarct. No hydrocephalus. No extra-axial collection. No mass effect or midline shift. ORBITS: No acute abnormality. SINUSES: Mild right ethmoid air cell mucosal thickening. Otherwise, clear sinuses. SOFT TISSUES AND SKULL: No acute soft tissue abnormality. No skull fracture. IMPRESSION: 1. No acute intracranial abnormality. Electronically signed by: Gilmore Molt MD 10/27/2024 01:41 PM EST RP Workstation: [id number]     Procedures   Medications Ordered in the ED  sodium chloride  0.9 % bolus 1,000 mL (1,000 mLs Intravenous New Bag/Given 10/27/24 1446)  acetaminophen  (TYLENOL ) tablet 1,000 mg (1,000 mg Oral Given 10/27/24 1351)  magnesium sulfate IVPB 2 g 50 mL (0 g Intravenous Stopped 10/27/24 1444)  ketorolac  (TORADOL ) 15 MG/ML injection 15 mg (15 mg Intravenous Given 10/27/24 1352)                                    Medical Decision Making Amount and/or Complexity of Data Reviewed Labs: ordered. Radiology: ordered.  Risk OTC drugs. Prescription drug management.   This patient presents to the ED for concern of headache, back pain.  Differential diagnosis includes migraine, viral URI, meningitis, head injury    Additional history obtained:  Additional history obtained from chart review   Lab Tests:  I Ordered, and personally interpreted labs.  The pertinent results include: CBC unremarkable, BMP with mild hyperglycemia 108, hCG negative, respiratory panel negative   Imaging Studies ordered:  I ordered imaging studies including CT head I independently visualized and interpreted imaging which showed negative for any acute findings I agree with the radiologist interpretation   Medicines  ordered and prescription drug management:  I ordered medication including Tylenol , Toradol , magnesium, fluid bolus for migraine cocktail Reevaluation of the patient after these medicines showed that the patient improved I have reviewed the patients home medicines and have made adjustments as needed   Problem List / ED Course:  Patient with past history significant for migraine headaches presents to the emergency department with concerns of headache and back pain.  Reports about 3 to 4 days of right sided migraine not responding to Tylenol  or ibuprofen  at home.  States that the headache is now associated with light and sound sensitivity.  She does report that she is currently breast-feeding.  Denies any fever, chills or bodyaches but does endorse a slight cough over the last several days. Physical exam is reassuring but evident photosensitivity is present.  Otherwise unremarkable from a vital standpoint with exception of mild tachycardia on arrival. Suspect likely migraine headache in nature but given infrequent course of migraines, will obtain imaging given  patient reporting this headache is more severe than her baseline migraines. Lab work unremarkable.  CT head negative.  On reevaluation after migraine cocktail administered including Toradol , Tylenol , magnesium, fluids, patient reports migraine has resolved.  Low concern at this time for more concerning etiology of her symptoms.  Given lack of fever and pain with movement of the neck, low concern for meningitis.  Return precautions discussed such as concerns for new or worsening symptoms.  She is otherwise stable for outpatient follow-up and discharged home.   Social Determinants of Health:  None  Final diagnoses:  Other migraine without status migrainosus, not intractable  Chronic right-sided low back pain with right-sided sciatica    ED Discharge Orders     None          Cecily Legrand LABOR, PA-C 10/27/24 1556    Franklyn Sid SAILOR,  MD 10/27/24 1627

## 2024-10-28 NOTE — Telephone Encounter (Signed)
Appt made. KH 

## 2024-10-29 ENCOUNTER — Encounter: Payer: Self-pay | Admitting: Nurse Practitioner

## 2024-10-29 ENCOUNTER — Ambulatory Visit: Payer: Self-pay | Admitting: Nurse Practitioner

## 2024-10-29 VITALS — BP 124/71 | HR 80 | Wt 139.4 lb

## 2024-10-29 DIAGNOSIS — Z111 Encounter for screening for respiratory tuberculosis: Secondary | ICD-10-CM | POA: Diagnosis not present
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
## 2024-11-01 LAB — QUANTIFERON-TB GOLD PLUS
QuantiFERON Mitogen Value: >10 [IU]/mL
QuantiFERON Nil Value: 0.02 [IU]/mL
QuantiFERON TB1 Ag Value: 0.01 [IU]/mL
QuantiFERON TB2 Ag Value: 0.02 [IU]/mL

## 2024-11-03 ENCOUNTER — Telehealth: Payer: Self-pay | Admitting: Physical Medicine and Rehabilitation

## 2024-11-03 ENCOUNTER — Other Ambulatory Visit: Payer: Self-pay | Admitting: Physical Medicine and Rehabilitation

## 2024-11-03 ENCOUNTER — Ambulatory Visit: Payer: Self-pay | Admitting: Nurse Practitioner

## 2024-11-03 DIAGNOSIS — M5416 Radiculopathy, lumbar region: Secondary | ICD-10-CM

## 2024-11-03 NOTE — Telephone Encounter (Signed)
 Pt called and said that she was waiting on Megan to call her back about an injection. RA#[phone]

## 2024-11-04 ENCOUNTER — Other Ambulatory Visit: Payer: Self-pay

## 2024-11-04 ENCOUNTER — Other Ambulatory Visit (HOSPITAL_COMMUNITY)
Admission: RE | Admit: 2024-11-04 | Discharge: 2024-11-04 | Disposition: A | Discharge status: Home / Self Care | Source: Ambulatory Visit | Attending: Obstetrics & Gynecology | Admitting: Obstetrics & Gynecology

## 2024-11-04 ENCOUNTER — Ambulatory Visit (INDEPENDENT_AMBULATORY_CARE_PROVIDER_SITE_OTHER): Admitting: Obstetrics & Gynecology

## 2024-11-04 ENCOUNTER — Encounter: Payer: Self-pay | Admitting: Obstetrics & Gynecology

## 2024-11-04 VITALS — BP 110/73 | HR 114 | Wt 137.6 lb

## 2024-11-04 DIAGNOSIS — T8384XA Pain from genitourinary prosthetic devices, implants and grafts, initial encounter: Secondary | ICD-10-CM | POA: Diagnosis not present

## 2024-11-04 NOTE — Progress Notes (Signed)
    GYNECOLOGY OFFICE ENCOUNTER NOTE  History:  23 y.o. G1P0101 here today for today for evaluation of pain attributed to her IUD; Paragard   IUD was placed  06/26/2024.  Feels significant pain during intercourse and some pain outside intercourse.  Wants to make sure IUD is okay. Also reports having malodorous bleeding towards end of her periods.  She feels this is affecting her sex life, does not have any desire to have intercourse.  The following portions of the patient's history were reviewed and updated as appropriate: allergies, current medications, past family history, past medical history, past social history, past surgical history and problem list. Last pap smear on 06/26/2024 was normal, negative HRHPV.  Review of Systems:  Pertinent items are noted in HPI.   Objective:  Chaperone Rosaline Pendleton, RN  Physical Exam Blood pressure 110/73, pulse (!) 114, weight 137 lb 9.6 oz (62.4 kg), last menstrual period 10/05/2024, currently breastfeeding. CONSTITUTIONAL: Well-developed, well-nourished female in no acute distress.  NEUROLOGIC: Alert and oriented to person, place, and time. Normal reflexes, muscle tone coordination.  PSYCHIATRIC: Normal mood and affect. Normal behavior. Normal judgment and thought content. CARDIOVASCULAR: Normal heart rate noted RESPIRATORY: Effort and breath sounds normal, no problems with respiration noted ABDOMEN: Soft, no distention noted.   PELVIC: Normal appearing external genitalia; normal appearing vaginal mucosa and cervix.  Paragard  IUD strings visualized, about 3 cm in length outside cervix. Brown discharge noted in vagina, testing sample obtained.  Done in the presence of a chaperone.   Assessment & Plan:  1. Pain due to intrauterine contraceptive device (IUD), initial encounter (Primary) Will follow up vaginitis testing. No other abnormalities on exam, ultrasound ordered to evaluate intrauterine IUD position. Will follow up results and manage  accordingly. - Cervicovaginal ancillary only - US  PELVIC COMPLETE WITH TRANSVAGINAL; Future   I spent 25 minutes dedicated to the care of this patient including pre-visit review of records, face to face time with the patient discussing her conditions and treatments, post visit ordering of medications and appropriate tests or procedures, coordinating care and documenting this visit encounter.     GLORIS HUGGER, MD, FACOG Obstetrician & Gynecologist, [facility] for [employer], [facility]

## 2024-11-05 LAB — CERVICOVAGINAL ANCILLARY ONLY
Bacterial Vaginitis (gardnerella): POSITIVE — AB
Candida Glabrata: NEGATIVE
Candida Vaginitis: POSITIVE — AB
Chlamydia: NEGATIVE
Comment: NEGATIVE
Comment: NEGATIVE
Comment: NEGATIVE
Comment: NEGATIVE
Comment: NEGATIVE
Comment: NORMAL
Neisseria Gonorrhea: NEGATIVE
Trichomonas: NEGATIVE

## 2024-11-06 ENCOUNTER — Telehealth: Payer: Self-pay | Admitting: Nurse Practitioner

## 2024-11-06 ENCOUNTER — Ambulatory Visit: Payer: Self-pay | Admitting: Obstetrics & Gynecology

## 2024-11-06 DIAGNOSIS — B9689 Other specified bacterial agents as the cause of diseases classified elsewhere: Secondary | ICD-10-CM

## 2024-11-06 DIAGNOSIS — B3731 Acute candidiasis of vulva and vagina: Secondary | ICD-10-CM

## 2024-11-06 MED ORDER — METRONIDAZOLE 500 MG PO TABS: 500.0000 mg | ORAL_TABLET | Freq: Two times a day (BID) | ORAL | 3 refills | Status: AC

## 2024-11-06 MED ORDER — FLUCONAZOLE 150 MG PO TABS: 150.0000 mg | ORAL_TABLET | ORAL | 3 refills | Status: AC

## 2024-11-06 NOTE — Telephone Encounter (Signed)
 Copied from CRM #[id number]. Topic: Clinical - Lab/Test Results >> Nov 06, 2024  9:35 AM Larissa RAMAN wrote: Reason for CRM: Patient is requesting a copy of lab results. Requesting a call when ready for pickup.

## 2024-11-06 NOTE — Telephone Encounter (Signed)
 PT was called and results were sent to pt via email per pt request. [facility]

## 2024-11-07 ENCOUNTER — Ambulatory Visit (HOSPITAL_BASED_OUTPATIENT_CLINIC_OR_DEPARTMENT_OTHER)

## 2024-11-17 ENCOUNTER — Other Ambulatory Visit: Payer: Self-pay

## 2024-11-17 ENCOUNTER — Ambulatory Visit: Admitting: Physical Medicine and Rehabilitation

## 2024-11-17 VITALS — BP 109/73 | HR 102

## 2024-11-17 DIAGNOSIS — M5416 Radiculopathy, lumbar region: Secondary | ICD-10-CM

## 2024-11-17 MED ORDER — METHYLPREDNISOLONE ACETATE 40 MG/ML IJ SUSP
40.0000 mg | Freq: Once | INTRAMUSCULAR | Status: AC
  Administered 2024-11-17: 40 mg

## 2024-11-17 NOTE — Progress Notes (Signed)
 "  Bethany Harris - 23 y.o. female MRN [id number]  Date of birth: March 21, 2001  Office Visit Note: Visit Date: 11/17/2024 PCP: Oley Bascom RAMAN, NP Referred by: Oley Bascom RAMAN, NP  Subjective: Chief Complaint  Patient presents with   Lower Back - Pain   HPI:  Bethany Harris is a 23 y.o. female who comes in today at the request of Duwaine Pouch, FNP for planned Right L4-5 Lumbar Interlaminar epidural steroid injection with fluoroscopic guidance.  The patient has failed conservative care including home exercise, medications, time and activity modification.  This injection will be diagnostic and hopefully therapeutic.  Please see requesting physician notes for further details and justification. Iohxol allergy.    ROS Otherwise per HPI.  Assessment & Plan: Visit Diagnoses:    ICD-10-CM   1. Lumbar radiculopathy  M54.16 XR C-ARM NO REPORT    Epidural Steroid injection    methylPREDNISolone  acetate (DEPO-MEDROL ) injection 40 mg      Plan: No additional findings.   Meds & Orders:  Meds ordered this encounter  Medications   methylPREDNISolone  acetate (DEPO-MEDROL ) injection 40 mg    Orders Placed This Encounter  Procedures   XR C-ARM NO REPORT   Epidural Steroid injection    Follow-up: Return for visit to requesting provider as needed.   Procedures: No procedures performed  Lumbar Epidural Steroid Injection - Interlaminar Approach with Fluoroscopic Guidance  Patient: Bethany Harris      Date of Birth: [birth year] MRN: [id number] PCP: Oley Bascom RAMAN, NP      Visit Date: 11/17/2024   Universal Protocol:     Consent Given By: the patient  Position: PRONE  Additional Comments: Vital signs were monitored before and after the procedure. Patient was prepped and draped in the usual sterile fashion. The correct patient, procedure, and site was verified.   Injection Procedure Details:   Procedure diagnoses: Lumbar radiculopathy [M54.16]   Meds Administered:  Meds ordered this  encounter  Medications   methylPREDNISolone  acetate (DEPO-MEDROL ) injection 40 mg     Laterality: Right  Location/Site:  L4-5  Needle: 3.5 in., 20 ga. Tuohy  Needle Placement: Paramedian epidural  Findings:   -Comments: Excellent flow of contrast into the epidural space.  Procedure Details: Using a paramedian approach from the side mentioned above, the region overlying the inferior lamina was localized under fluoroscopic visualization and the soft tissues overlying this structure were infiltrated with 4 ml. of 1% Lidocaine  without Epinephrine . The Tuohy needle was inserted into the epidural space using a paramedian approach.   The epidural space was localized using loss of resistance along with counter oblique bi-planar fluoroscopic views.  After negative aspirate for air, blood, and CSF, he injectate was administered into the level noted above. Radiographs were obtained for documentation purposes.       Additional Comments:  The patient tolerated the procedure well Dressing: 2 x 2 sterile gauze and Band-Aid    Post-procedure details: Patient was observed during the procedure. Post-procedure instructions were reviewed.  Patient left the clinic in stable condition.   Clinical History: Narrative & Impression CLINICAL DATA:  Back pain.   EXAM: MRI LUMBAR SPINE WITHOUT CONTRAST   TECHNIQUE: Multiplanar, multisequence MR imaging of the lumbar spine was performed. No intravenous contrast was administered.   COMPARISON:  Prior study 12/16/2022   FINDINGS: Segmentation: There are five lumbar type vertebral bodies. The last full intervertebral disc space is labeled L5-S1. This correlates with the prior study.   Alignment:  Normal   Vertebrae:  No fracture, evidence of discitis, or bone lesion.   Conus medullaris and cauda equina: Conus extends to the L1-2 level. Conus and cauda equina appear normal.   Paraspinal and other soft tissues: No significant  paraspinal retroperitoneal findings.   Disc levels:   L1-2: No significant findings.   L2-3: No significant findings.   L3-4: No significant findings.   L4-5: Shallow central and slightly right paracentral disc protrusion demonstrates slight interval desiccation and retraction. Mild impression on the ventral thecal sac and mild right lateral recess encroachment but no significant spinal or foraminal stenosis.   L5-S1: Stable mild bulging annulus but no disc protrusions, spinal or foraminal stenosis.   IMPRESSION: 1. Shallow central and slightly right paracentral disc protrusion at L4-5 demonstrates slight interval desiccation and retraction. Mild impression on the ventral thecal sac and mild right lateral recess encroachment but no significant spinal or foraminal stenosis. 2. Stable mild bulging annulus at L5-S1 but no disc protrusions, spinal or foraminal stenosis.     Electronically Signed   By: MYRTIS Stammer M.D.   On: 07/30/2024 18:37     Objective:  VS:  HT:    WT:   BMI:     BP:109/73  HR:(!) 102bpm  TEMP: ( )  RESP:  Physical Exam Vitals and nursing note reviewed.  Constitutional:      General: She is not in acute distress.    Appearance: Normal appearance. She is not ill-appearing.  HENT:     Head: Normocephalic and atraumatic.     Right Ear: External ear normal.     Left Ear: External ear normal.  Eyes:     Extraocular Movements: Extraocular movements intact.  Cardiovascular:     Rate and Rhythm: Normal rate.     Pulses: Normal pulses.  Pulmonary:     Effort: Pulmonary effort is normal. No respiratory distress.  Abdominal:     General: There is no distension.     Palpations: Abdomen is soft.  Musculoskeletal:        General: Tenderness present.     Cervical back: Neck supple.     Right lower leg: No edema.     Left lower leg: No edema.     Comments: Patient has good distal strength with no pain over the greater trochanters.  No clonus or focal  weakness.  Skin:    Findings: No erythema, lesion or rash.  Neurological:     General: No focal deficit present.     Mental Status: She is alert and oriented to person, place, and time.     Sensory: No sensory deficit.     Motor: No weakness or abnormal muscle tone.     Coordination: Coordination normal.  Psychiatric:        Mood and Affect: Mood normal.        Behavior: Behavior normal.      Imaging: No results found. "

## 2024-11-17 NOTE — Procedures (Signed)
 Lumbar Epidural Steroid Injection - Interlaminar Approach with Fluoroscopic Guidance  Patient: Bethany Harris      Date of Birth: [birth year] MRN: [id number] PCP: Oley Bascom RAMAN, NP      Visit Date: 11/17/2024   Universal Protocol:     Consent Given By: the patient  Position: PRONE  Additional Comments: Vital signs were monitored before and after the procedure. Patient was prepped and draped in the usual sterile fashion. The correct patient, procedure, and site was verified.   Injection Procedure Details:   Procedure diagnoses: Lumbar radiculopathy [M54.16]   Meds Administered:  Meds ordered this encounter  Medications   methylPREDNISolone  acetate (DEPO-MEDROL ) injection 40 mg     Laterality: Right  Location/Site:  L4-5  Needle: 3.5 in., 20 ga. Tuohy  Needle Placement: Paramedian epidural  Findings:   -Comments: Excellent flow of contrast into the epidural space.  Procedure Details: Using a paramedian approach from the side mentioned above, the region overlying the inferior lamina was localized under fluoroscopic visualization and the soft tissues overlying this structure were infiltrated with 4 ml. of 1% Lidocaine  without Epinephrine . The Tuohy needle was inserted into the epidural space using a paramedian approach.   The epidural space was localized using loss of resistance along with counter oblique bi-planar fluoroscopic views.  After negative aspirate for air, blood, and CSF, he injectate was administered into the level noted above. Radiographs were obtained for documentation purposes.       Additional Comments:  The patient tolerated the procedure well Dressing: 2 x 2 sterile gauze and Band-Aid    Post-procedure details: Patient was observed during the procedure. Post-procedure instructions were reviewed.  Patient left the clinic in stable condition.

## 2024-11-17 NOTE — Progress Notes (Signed)
 Pain Scale   Average Pain 6 Patient advising she has lower back pain that is constant pain increases at night. Patient advised she has allergy to IVP-DYE        +Driver, -BT, +Dye Allergies.

## 2024-11-26 ENCOUNTER — Ambulatory Visit (HOSPITAL_COMMUNITY)
Admission: RE | Admit: 2024-11-26 | Discharge: 2024-11-26 | Disposition: A | Discharge status: Home / Self Care | Source: Ambulatory Visit | Attending: Obstetrics & Gynecology | Admitting: Obstetrics & Gynecology

## 2024-11-26 DIAGNOSIS — T8384XA Pain from genitourinary prosthetic devices, implants and grafts, initial encounter: Secondary | ICD-10-CM | POA: Insufficient documentation

## 2024-12-09 ENCOUNTER — Other Ambulatory Visit: Payer: Self-pay

## 2024-12-09 ENCOUNTER — Ambulatory Visit: Payer: Self-pay

## 2024-12-09 DIAGNOSIS — Z111 Encounter for screening for respiratory tuberculosis: Secondary | ICD-10-CM

## 2024-12-10 ENCOUNTER — Other Ambulatory Visit

## 2024-12-10 ENCOUNTER — Ambulatory Visit: Payer: Self-pay

## 2024-12-10 VITALS — Ht 64.0 in | Wt 137.0 lb

## 2024-12-10 DIAGNOSIS — Z23 Encounter for immunization: Secondary | ICD-10-CM

## 2024-12-10 NOTE — Progress Notes (Signed)
 Flu vaccine given to pt.left arm.   KH

## 2024-12-31 ENCOUNTER — Telehealth: Payer: Self-pay | Admitting: Family Medicine

## 2024-12-31 NOTE — Telephone Encounter (Signed)
 Patient called wanting to schedule appointment for pregnancy test because she got a positive test at home 2 days ago but she says she has an IUD in so she also wants to get that removed. Patient reports she has not had any bleeding but she is having pain in her stomach that is bothering her. After speaking with Nurse Vernell and [facility] I let the patient know that she will need to go to the MAU as soon as possible. Confirmed the address with the patient and also let her know I will send it to her through mychat as well. Patient says she will try to get there tonight if she cannot make it she will go tomorrow.

## 2025-01-01 ENCOUNTER — Telehealth: Payer: Self-pay | Admitting: Family Medicine

## 2025-01-01 ENCOUNTER — Inpatient Hospital Stay (HOSPITAL_COMMUNITY)
Admission: AD | Admit: 2025-01-01 | Discharge: 2025-01-01 | Discharge status: Against Medical Advice | Source: Ambulatory Visit | Attending: Obstetrics & Gynecology | Admitting: Obstetrics & Gynecology

## 2025-01-01 LAB — URINALYSIS, ROUTINE W REFLEX MICROSCOPIC
Bilirubin Urine: NEGATIVE
Glucose, UA: NEGATIVE mg/dL
Ketones, ur: 20 mg/dL — AB
Leukocytes,Ua: NEGATIVE
Nitrite: NEGATIVE
Protein, ur: 30 mg/dL — AB
Specific Gravity, Urine: 1.028 (ref 1.005–1.030)
pH: 5 (ref 5.0–8.0)

## 2025-01-01 LAB — WET PREP, GENITAL
Sperm: NONE SEEN
Trich, Wet Prep: NONE SEEN
WBC, Wet Prep HPF POC: >=10 — AB
Yeast Wet Prep HPF POC: NONE SEEN

## 2025-01-01 LAB — POCT PREGNANCY, URINE: Preg Test, Ur: NEGATIVE

## 2025-01-01 LAB — HCG, QUANTITATIVE, PREGNANCY: hCG, Beta Chain, Quant, S: <1 m[IU]/mL

## 2025-01-01 MED ORDER — METRONIDAZOLE 500 MG PO TABS: 500.0000 mg | ORAL_TABLET | Freq: Two times a day (BID) | ORAL | 0 refills | Status: AC

## 2025-01-01 NOTE — Telephone Encounter (Signed)
 Patient presented to the MAU after having a home positive pregnancy test.  She had to leave prior to being seen because her child needed to be picked up from daycare.  hCG quant was less than 1.  She did have a diagnosis of bacterial vaginosis called patient to discuss results and will send metronidazole  to patient's pharmacy.  No further questions or concerns.  She would like to have her IUD removed and will call the clinic to get that scheduled.

## 2025-01-01 NOTE — MAU Note (Signed)
 Bethany Harris is a 24 y.o. at Unknown here in MAU reporting: reports she has IUD . Has been having back pain/ abd pain   and has not gotten her period this month LMP 11/28/2024. Took HPT and it was positive. (MAU was negative ) And c/o clear vag discharge as well.   LMP: 11/28/2024 Onset of complaint: x1 week  Pain score: 8-9 Vitals:   01/01/25 1430  BP: 111/68  Pulse: (!) 102  Resp: 18  Temp: 98.7 F (37.1 C)     FHT: n/a  Lab orders placed from triage: UPT, Wet prep, gc, U/A, HCG

## 2025-01-01 NOTE — MAU Note (Signed)
 Pt had to leave an pick up her child. Left before results could be read.

## 2025-01-01 NOTE — Progress Notes (Signed)
 Patient left without being seen.

## 2025-01-02 LAB — GC/CHLAMYDIA PROBE AMP (~~LOC~~) NOT AT ARMC
Chlamydia: NEGATIVE
Comment: NEGATIVE
Comment: NORMAL
Neisseria Gonorrhea: NEGATIVE
